# Patient Record
Sex: Female | Born: 1935 | Race: Black or African American | Hispanic: No | Marital: Single | Smoking: Never smoker
Health system: Southern US, Community
[De-identification: ages and names within clinical notes are randomized; demographics above are authoritative.]

## PROBLEM LIST (undated history)

## (undated) DIAGNOSIS — I4891 Unspecified atrial fibrillation: Secondary | ICD-10-CM

## (undated) DIAGNOSIS — F0390 Unspecified dementia without behavioral disturbance: Secondary | ICD-10-CM

## (undated) DIAGNOSIS — K5792 Diverticulitis of intestine, part unspecified, without perforation or abscess without bleeding: Secondary | ICD-10-CM

## (undated) DIAGNOSIS — I10 Essential (primary) hypertension: Secondary | ICD-10-CM

## (undated) DIAGNOSIS — M1990 Unspecified osteoarthritis, unspecified site: Secondary | ICD-10-CM

## (undated) HISTORY — PX: DILATION AND CURETTAGE OF UTERUS: SHX78

## (undated) HISTORY — PX: TOTAL ABDOMINAL HYSTERECTOMY W/ BILATERAL SALPINGOOPHORECTOMY: SHX83

## (undated) HISTORY — PX: JOINT REPLACEMENT: SHX530

## (undated) HISTORY — PX: REPLACEMENT TOTAL KNEE BILATERAL: SUR1225

## (undated) HISTORY — PX: CATARACT EXTRACTION W/ INTRAOCULAR LENS  IMPLANT, BILATERAL: SHX1307

## (undated) HISTORY — PX: CARPAL TUNNEL RELEASE: SHX101

## (undated) HISTORY — PX: TUBAL LIGATION: SHX77

---

## 1999-09-03 ENCOUNTER — Emergency Department (HOSPITAL_COMMUNITY): Admission: EM | Admit: 1999-09-03 | Discharge: 1999-09-03 | Payer: Self-pay

## 2000-02-14 ENCOUNTER — Emergency Department (HOSPITAL_COMMUNITY): Admission: EM | Admit: 2000-02-14 | Discharge: 2000-02-14 | Payer: Self-pay | Admitting: Emergency Medicine

## 2000-02-14 ENCOUNTER — Encounter: Payer: Self-pay | Admitting: Pulmonary Disease

## 2000-02-19 ENCOUNTER — Encounter: Payer: Self-pay | Admitting: Pulmonary Disease

## 2000-02-19 ENCOUNTER — Ambulatory Visit (HOSPITAL_COMMUNITY): Admission: RE | Admit: 2000-02-19 | Discharge: 2000-02-19 | Payer: Self-pay | Admitting: Pulmonary Disease

## 2000-04-19 ENCOUNTER — Encounter: Payer: Self-pay | Admitting: Orthopedic Surgery

## 2000-04-21 ENCOUNTER — Ambulatory Visit (HOSPITAL_COMMUNITY): Admission: RE | Admit: 2000-04-21 | Discharge: 2000-04-21 | Payer: Self-pay | Admitting: Orthopedic Surgery

## 2001-05-09 ENCOUNTER — Encounter: Admission: RE | Admit: 2001-05-09 | Discharge: 2001-06-16 | Payer: Self-pay | Admitting: Orthopedic Surgery

## 2001-08-04 ENCOUNTER — Other Ambulatory Visit: Admission: RE | Admit: 2001-08-04 | Discharge: 2001-08-04 | Payer: Self-pay | Admitting: Otolaryngology

## 2002-09-04 ENCOUNTER — Ambulatory Visit (HOSPITAL_COMMUNITY): Admission: RE | Admit: 2002-09-04 | Discharge: 2002-09-04 | Payer: Self-pay | Admitting: Internal Medicine

## 2004-11-04 ENCOUNTER — Emergency Department (HOSPITAL_COMMUNITY): Admission: EM | Admit: 2004-11-04 | Discharge: 2004-11-04 | Payer: Self-pay | Admitting: Emergency Medicine

## 2004-11-14 ENCOUNTER — Inpatient Hospital Stay (HOSPITAL_COMMUNITY): Admission: AD | Admit: 2004-11-14 | Discharge: 2004-11-17 | Payer: Self-pay | Admitting: Internal Medicine

## 2005-08-18 ENCOUNTER — Inpatient Hospital Stay (HOSPITAL_COMMUNITY): Admission: RE | Admit: 2005-08-18 | Discharge: 2005-08-22 | Payer: Self-pay | Admitting: Orthopedic Surgery

## 2005-11-15 ENCOUNTER — Inpatient Hospital Stay (HOSPITAL_COMMUNITY): Admission: EM | Admit: 2005-11-15 | Discharge: 2005-11-18 | Payer: Self-pay | Admitting: Emergency Medicine

## 2005-11-16 ENCOUNTER — Encounter: Payer: Self-pay | Admitting: Vascular Surgery

## 2006-01-12 ENCOUNTER — Ambulatory Visit (HOSPITAL_COMMUNITY): Admission: RE | Admit: 2006-01-12 | Discharge: 2006-01-12 | Payer: Self-pay | Admitting: Internal Medicine

## 2006-04-19 ENCOUNTER — Encounter: Admission: RE | Admit: 2006-04-19 | Discharge: 2006-04-19 | Payer: Self-pay | Admitting: Orthopedic Surgery

## 2006-04-29 ENCOUNTER — Inpatient Hospital Stay (HOSPITAL_COMMUNITY): Admission: RE | Admit: 2006-04-29 | Discharge: 2006-05-03 | Payer: Self-pay | Admitting: Orthopedic Surgery

## 2006-04-30 ENCOUNTER — Ambulatory Visit: Payer: Self-pay | Admitting: Physical Medicine & Rehabilitation

## 2007-08-12 ENCOUNTER — Encounter: Admission: RE | Admit: 2007-08-12 | Discharge: 2007-08-12 | Payer: Self-pay | Admitting: Orthopedic Surgery

## 2008-10-30 ENCOUNTER — Encounter: Admission: RE | Admit: 2008-10-30 | Discharge: 2008-10-30 | Payer: Self-pay | Admitting: Orthopedic Surgery

## 2009-02-11 ENCOUNTER — Encounter: Admission: RE | Admit: 2009-02-11 | Discharge: 2009-02-11 | Payer: Self-pay | Admitting: Orthopedic Surgery

## 2009-06-10 ENCOUNTER — Encounter: Admission: RE | Admit: 2009-06-10 | Discharge: 2009-06-10 | Payer: Self-pay | Admitting: Orthopedic Surgery

## 2009-08-20 ENCOUNTER — Emergency Department (HOSPITAL_COMMUNITY): Admission: EM | Admit: 2009-08-20 | Discharge: 2009-08-20 | Payer: Self-pay | Admitting: Emergency Medicine

## 2009-09-11 ENCOUNTER — Emergency Department (HOSPITAL_COMMUNITY): Admission: EM | Admit: 2009-09-11 | Discharge: 2009-09-12 | Payer: Self-pay | Admitting: Emergency Medicine

## 2009-09-21 ENCOUNTER — Ambulatory Visit: Payer: Self-pay | Admitting: Pulmonary Disease

## 2009-09-21 ENCOUNTER — Inpatient Hospital Stay (HOSPITAL_COMMUNITY): Admission: EM | Admit: 2009-09-21 | Discharge: 2009-09-27 | Payer: Self-pay | Admitting: Emergency Medicine

## 2009-09-21 ENCOUNTER — Ambulatory Visit: Payer: Self-pay | Admitting: Vascular Surgery

## 2009-09-21 ENCOUNTER — Encounter (INDEPENDENT_AMBULATORY_CARE_PROVIDER_SITE_OTHER): Payer: Self-pay | Admitting: Emergency Medicine

## 2009-09-23 ENCOUNTER — Encounter (INDEPENDENT_AMBULATORY_CARE_PROVIDER_SITE_OTHER): Payer: Self-pay | Admitting: Internal Medicine

## 2010-03-03 ENCOUNTER — Observation Stay (HOSPITAL_COMMUNITY): Admission: EM | Admit: 2010-03-03 | Discharge: 2010-03-05 | Payer: Self-pay | Admitting: Emergency Medicine

## 2010-03-05 ENCOUNTER — Ambulatory Visit: Payer: Self-pay | Admitting: Vascular Surgery

## 2010-03-05 ENCOUNTER — Encounter (INDEPENDENT_AMBULATORY_CARE_PROVIDER_SITE_OTHER): Payer: Self-pay | Admitting: Internal Medicine

## 2010-07-28 ENCOUNTER — Emergency Department (HOSPITAL_COMMUNITY)
Admission: EM | Admit: 2010-07-28 | Discharge: 2010-07-28 | Payer: Self-pay | Source: Home / Self Care | Admitting: Emergency Medicine

## 2010-07-29 LAB — DIFFERENTIAL
Basophils Absolute: 0 10*3/uL (ref 0.0–0.1)
Basophils Relative: 0 % (ref 0–1)
Eosinophils Absolute: 0.2 10*3/uL (ref 0.0–0.7)
Eosinophils Relative: 2 % (ref 0–5)
Lymphocytes Relative: 17 % (ref 12–46)
Lymphs Abs: 2.2 10*3/uL (ref 0.7–4.0)
Monocytes Absolute: 1.4 10*3/uL — ABNORMAL HIGH (ref 0.1–1.0)
Monocytes Relative: 11 % (ref 3–12)
Neutro Abs: 9 10*3/uL — ABNORMAL HIGH (ref 1.7–7.7)
Neutrophils Relative %: 70 % (ref 43–77)

## 2010-07-29 LAB — BASIC METABOLIC PANEL
BUN: 21 mg/dL (ref 6–23)
CO2: 26 mEq/L (ref 19–32)
Calcium: 10 mg/dL (ref 8.4–10.5)
Chloride: 107 mEq/L (ref 96–112)
Creatinine, Ser: 1.38 mg/dL — ABNORMAL HIGH (ref 0.4–1.2)
GFR calc Af Amer: 44 mL/min — ABNORMAL LOW (ref >60)
GFR calc non Af Amer: 36 mL/min — ABNORMAL LOW (ref >60)
Glucose, Bld: 91 mg/dL (ref 70–99)
Potassium: 4 mEq/L (ref 3.5–5.1)
Sodium: 141 mEq/L (ref 135–145)

## 2010-07-29 LAB — POCT CARDIAC MARKERS
CKMB, poc: <1 ng/mL — ABNORMAL LOW (ref 1.0–8.0)
Myoglobin, poc: 169 ng/mL (ref 12–200)
Troponin i, poc: <0.05 ng/mL (ref 0.00–0.09)

## 2010-07-29 LAB — CBC
HCT: 35.5 % — ABNORMAL LOW (ref 36.0–46.0)
Hemoglobin: 11.8 g/dL — ABNORMAL LOW (ref 12.0–15.0)
MCH: 24.1 pg — ABNORMAL LOW (ref 26.0–34.0)
MCHC: 33.2 g/dL (ref 30.0–36.0)
MCV: 72.6 fL — ABNORMAL LOW (ref 78.0–100.0)
Platelets: 280 10*3/uL (ref 150–400)
RBC: 4.89 MIL/uL (ref 3.87–5.11)
RDW: 15.7 % — ABNORMAL HIGH (ref 11.5–15.5)
WBC: 12.8 10*3/uL — ABNORMAL HIGH (ref 4.0–10.5)

## 2010-07-29 LAB — PROTIME-INR
INR: 2.84 — ABNORMAL HIGH (ref 0.00–1.49)
Prothrombin Time: 29.9 seconds — ABNORMAL HIGH (ref 11.6–15.2)

## 2010-07-29 LAB — APTT: aPTT: 34 seconds (ref 24–37)

## 2010-08-03 LAB — CULTURE, BLOOD (ROUTINE X 2)
Culture  Setup Time: 201201231801
Culture  Setup Time: 201201231801
Culture: NO GROWTH
Culture: NO GROWTH

## 2010-08-05 ENCOUNTER — Other Ambulatory Visit: Payer: Self-pay | Admitting: Neurology

## 2010-08-05 ENCOUNTER — Other Ambulatory Visit: Payer: Self-pay | Admitting: Internal Medicine

## 2010-08-05 DIAGNOSIS — R55 Syncope and collapse: Secondary | ICD-10-CM

## 2010-08-11 ENCOUNTER — Other Ambulatory Visit: Payer: Self-pay

## 2010-08-14 ENCOUNTER — Other Ambulatory Visit: Payer: Self-pay

## 2010-08-15 ENCOUNTER — Other Ambulatory Visit: Payer: Self-pay

## 2010-08-18 ENCOUNTER — Ambulatory Visit
Admission: RE | Admit: 2010-08-18 | Discharge: 2010-08-18 | Disposition: A | Discharge status: Home / Self Care | Payer: Medicare Other | Source: Ambulatory Visit | Attending: Internal Medicine | Admitting: Internal Medicine

## 2010-08-18 ENCOUNTER — Ambulatory Visit
Admission: RE | Admit: 2010-08-18 | Discharge: 2010-08-18 | Disposition: A | Discharge status: Home / Self Care | Payer: 59 | Source: Ambulatory Visit | Attending: Internal Medicine | Admitting: Internal Medicine

## 2010-08-18 DIAGNOSIS — R55 Syncope and collapse: Secondary | ICD-10-CM

## 2010-09-18 LAB — BASIC METABOLIC PANEL
BUN: 19 mg/dL (ref 6–23)
BUN: 22 mg/dL (ref 6–23)
BUN: 22 mg/dL (ref 6–23)
CO2: 23 mEq/L (ref 19–32)
CO2: 24 mEq/L (ref 19–32)
CO2: 26 mEq/L (ref 19–32)
Calcium: 8.8 mg/dL (ref 8.4–10.5)
Calcium: 9 mg/dL (ref 8.4–10.5)
Calcium: 9.8 mg/dL (ref 8.4–10.5)
Chloride: 104 mEq/L (ref 96–112)
Chloride: 107 mEq/L (ref 96–112)
Chloride: 108 mEq/L (ref 96–112)
Creatinine, Ser: 1.3 mg/dL — ABNORMAL HIGH (ref 0.4–1.2)
Creatinine, Ser: 1.4 mg/dL — ABNORMAL HIGH (ref 0.4–1.2)
Creatinine, Ser: 1.41 mg/dL — ABNORMAL HIGH (ref 0.4–1.2)
GFR calc Af Amer: 42 mL/min — ABNORMAL LOW (ref >60)
GFR calc Af Amer: 43 mL/min — ABNORMAL LOW (ref >60)
GFR calc Af Amer: 47 mL/min — ABNORMAL LOW (ref >60)
GFR calc non Af Amer: 35 mL/min — ABNORMAL LOW (ref >60)
GFR calc non Af Amer: 35 mL/min — ABNORMAL LOW (ref >60)
GFR calc non Af Amer: 39 mL/min — ABNORMAL LOW (ref >60)
Glucose, Bld: 88 mg/dL (ref 70–99)
Glucose, Bld: 88 mg/dL (ref 70–99)
Glucose, Bld: 99 mg/dL (ref 70–99)
Potassium: 3.4 mEq/L — ABNORMAL LOW (ref 3.5–5.1)
Potassium: 3.6 mEq/L (ref 3.5–5.1)
Potassium: 4 mEq/L (ref 3.5–5.1)
Sodium: 136 mEq/L (ref 135–145)
Sodium: 138 mEq/L (ref 135–145)
Sodium: 138 mEq/L (ref 135–145)

## 2010-09-18 LAB — CBC
HCT: 31.4 % — ABNORMAL LOW (ref 36.0–46.0)
HCT: 31.9 % — ABNORMAL LOW (ref 36.0–46.0)
HCT: 37.4 % (ref 36.0–46.0)
Hemoglobin: 10.4 g/dL — ABNORMAL LOW (ref 12.0–15.0)
Hemoglobin: 10.8 g/dL — ABNORMAL LOW (ref 12.0–15.0)
Hemoglobin: 12.6 g/dL (ref 12.0–15.0)
MCH: 23.9 pg — ABNORMAL LOW (ref 26.0–34.0)
MCH: 24.5 pg — ABNORMAL LOW (ref 26.0–34.0)
MCH: 24.5 pg — ABNORMAL LOW (ref 26.0–34.0)
MCHC: 33.1 g/dL (ref 30.0–36.0)
MCHC: 33.7 g/dL (ref 30.0–36.0)
MCHC: 33.9 g/dL (ref 30.0–36.0)
MCV: 72 fL — ABNORMAL LOW (ref 78.0–100.0)
MCV: 72.3 fL — ABNORMAL LOW (ref 78.0–100.0)
MCV: 72.8 fL — ABNORMAL LOW (ref 78.0–100.0)
Platelets: 242 10*3/uL (ref 150–400)
Platelets: 254 10*3/uL (ref 150–400)
Platelets: 261 10*3/uL (ref 150–400)
RBC: 4.36 MIL/uL (ref 3.87–5.11)
RBC: 4.41 MIL/uL (ref 3.87–5.11)
RBC: 5.14 MIL/uL — ABNORMAL HIGH (ref 3.87–5.11)
RDW: 15.7 % — ABNORMAL HIGH (ref 11.5–15.5)
RDW: 15.9 % — ABNORMAL HIGH (ref 11.5–15.5)
RDW: 16.1 % — ABNORMAL HIGH (ref 11.5–15.5)
WBC: 13 10*3/uL — ABNORMAL HIGH (ref 4.0–10.5)
WBC: 14.9 10*3/uL — ABNORMAL HIGH (ref 4.0–10.5)
WBC: 16.6 10*3/uL — ABNORMAL HIGH (ref 4.0–10.5)

## 2010-09-18 LAB — COMPREHENSIVE METABOLIC PANEL
ALT: 12 U/L (ref 0–35)
AST: 18 U/L (ref 0–37)
Albumin: 3.5 g/dL (ref 3.5–5.2)
Alkaline Phosphatase: 40 U/L (ref 39–117)
BUN: 21 mg/dL (ref 6–23)
CO2: 27 mEq/L (ref 19–32)
Calcium: 9.7 mg/dL (ref 8.4–10.5)
Chloride: 105 mEq/L (ref 96–112)
Creatinine, Ser: 1.41 mg/dL — ABNORMAL HIGH (ref 0.4–1.2)
GFR calc Af Amer: 42 mL/min — ABNORMAL LOW (ref >60)
GFR calc non Af Amer: 35 mL/min — ABNORMAL LOW (ref >60)
Glucose, Bld: 91 mg/dL (ref 70–99)
Potassium: 3.6 mEq/L (ref 3.5–5.1)
Sodium: 138 mEq/L (ref 135–145)
Total Bilirubin: 0.7 mg/dL (ref 0.3–1.2)
Total Protein: 7.8 g/dL (ref 6.0–8.3)

## 2010-09-18 LAB — CULTURE, BLOOD (ROUTINE X 2)
Culture: NO GROWTH
Culture: NO GROWTH

## 2010-09-18 LAB — URINALYSIS, ROUTINE W REFLEX MICROSCOPIC
Bilirubin Urine: NEGATIVE
Glucose, UA: NEGATIVE mg/dL
Hgb urine dipstick: NEGATIVE
Ketones, ur: NEGATIVE mg/dL
Nitrite: NEGATIVE
Protein, ur: NEGATIVE mg/dL
Specific Gravity, Urine: 1.011 (ref 1.005–1.030)
Urobilinogen, UA: 0.2 mg/dL (ref 0.0–1.0)
pH: 6.5 (ref 5.0–8.0)

## 2010-09-18 LAB — POCT CARDIAC MARKERS
CKMB, poc: <1 ng/mL — ABNORMAL LOW (ref 1.0–8.0)
CKMB, poc: <1 ng/mL — ABNORMAL LOW (ref 1.0–8.0)
Myoglobin, poc: 89.5 ng/mL (ref 12–200)
Myoglobin, poc: 93.7 ng/mL (ref 12–200)
Troponin i, poc: <0.05 ng/mL (ref 0.00–0.09)
Troponin i, poc: <0.05 ng/mL (ref 0.00–0.09)

## 2010-09-18 LAB — DIFFERENTIAL
Basophils Absolute: 0 10*3/uL (ref 0.0–0.1)
Basophils Relative: 0 % (ref 0–1)
Eosinophils Absolute: 0.1 10*3/uL (ref 0.0–0.7)
Eosinophils Relative: 1 % (ref 0–5)
Lymphocytes Relative: 14 % (ref 12–46)
Lymphs Abs: 2.1 10*3/uL (ref 0.7–4.0)
Monocytes Absolute: 1.3 10*3/uL — ABNORMAL HIGH (ref 0.1–1.0)
Monocytes Relative: 9 % (ref 3–12)
Neutro Abs: 11.3 10*3/uL — ABNORMAL HIGH (ref 1.7–7.7)
Neutrophils Relative %: 76 % (ref 43–77)

## 2010-09-18 LAB — D-DIMER, QUANTITATIVE: D-Dimer, Quant: 1.1 ug/mL-FEU — ABNORMAL HIGH (ref 0.00–0.48)

## 2010-09-18 LAB — URINE CULTURE
Colony Count: >=100000
Culture  Setup Time: 201108301832

## 2010-09-18 LAB — URINE MICROSCOPIC-ADD ON

## 2010-09-18 LAB — APTT: aPTT: 29 seconds (ref 24–37)

## 2010-09-18 LAB — PROTIME-INR
INR: 1.48 (ref 0.00–1.49)
INR: 1.61 — ABNORMAL HIGH (ref 0.00–1.49)
INR: 2 — ABNORMAL HIGH (ref 0.00–1.49)
Prothrombin Time: 18.1 seconds — ABNORMAL HIGH (ref 11.6–15.2)
Prothrombin Time: 19.3 seconds — ABNORMAL HIGH (ref 11.6–15.2)
Prothrombin Time: 22.8 seconds — ABNORMAL HIGH (ref 11.6–15.2)

## 2010-09-18 LAB — HEPARIN LEVEL (UNFRACTIONATED)
Heparin Unfractionated: 0.17 IU/mL — ABNORMAL LOW (ref 0.30–0.70)
Heparin Unfractionated: 0.25 IU/mL — ABNORMAL LOW (ref 0.30–0.70)
Heparin Unfractionated: 0.32 IU/mL (ref 0.30–0.70)
Heparin Unfractionated: 0.35 IU/mL (ref 0.30–0.70)

## 2010-09-18 LAB — CARDIAC PANEL(CRET KIN+CKTOT+MB+TROPI)
CK, MB: 0.8 ng/mL (ref 0.3–4.0)
Relative Index: INVALID (ref 0.0–2.5)
Total CK: 31 U/L (ref 7–177)
Troponin I: 0.08 ng/mL — ABNORMAL HIGH (ref 0.00–0.06)

## 2010-09-18 LAB — GLUCOSE, CAPILLARY: Glucose-Capillary: 106 mg/dL — ABNORMAL HIGH (ref 70–99)

## 2010-09-18 LAB — TSH: TSH: 1.301 u[IU]/mL (ref 0.350–4.500)

## 2010-09-18 LAB — LIPID PANEL
Cholesterol: 155 mg/dL (ref 0–200)
HDL: 60 mg/dL (ref >39)
LDL Cholesterol: 88 mg/dL (ref 0–99)
Total CHOL/HDL Ratio: 2.6 RATIO
Triglycerides: 36 mg/dL (ref <150)
VLDL: 7 mg/dL (ref 0–40)

## 2010-09-18 LAB — HEMOGLOBIN A1C
Hgb A1c MFr Bld: 6 % — ABNORMAL HIGH (ref <5.7)
Mean Plasma Glucose: 126 mg/dL — ABNORMAL HIGH (ref <117)

## 2010-09-26 LAB — BASIC METABOLIC PANEL
BUN: 27 mg/dL — ABNORMAL HIGH (ref 6–23)
BUN: 22 mg/dL (ref 6–23)
CO2: 25 mEq/L (ref 19–32)
CO2: 24 mEq/L (ref 19–32)
Calcium: 9.8 mg/dL (ref 8.4–10.5)
Calcium: 9.2 mg/dL (ref 8.4–10.5)
Chloride: 102 mEq/L (ref 96–112)
Chloride: 106 mEq/L (ref 96–112)
Creatinine, Ser: 1.53 mg/dL — ABNORMAL HIGH (ref 0.4–1.2)
Creatinine, Ser: 1.42 mg/dL — ABNORMAL HIGH (ref 0.4–1.2)
GFR calc Af Amer: 39 mL/min — ABNORMAL LOW (ref >60)
GFR calc Af Amer: 42 mL/min — ABNORMAL LOW (ref >60)
GFR calc non Af Amer: 32 mL/min — ABNORMAL LOW (ref >60)
GFR calc non Af Amer: 35 mL/min — ABNORMAL LOW (ref >60)
Glucose, Bld: 93 mg/dL (ref 70–99)
Glucose, Bld: 97 mg/dL (ref 70–99)
Potassium: 3.6 mEq/L (ref 3.5–5.1)
Potassium: 3.8 mEq/L (ref 3.5–5.1)
Sodium: 138 mEq/L (ref 135–145)
Sodium: 136 mEq/L (ref 135–145)

## 2010-09-26 LAB — CBC
HCT: 40.3 % (ref 36.0–46.0)
HCT: 37.4 % (ref 36.0–46.0)
Hemoglobin: 13.4 g/dL (ref 12.0–15.0)
Hemoglobin: 12.5 g/dL (ref 12.0–15.0)
MCHC: 33.3 g/dL (ref 30.0–36.0)
MCHC: 33.3 g/dL (ref 30.0–36.0)
MCV: 81 fL (ref 78.0–100.0)
MCV: 80.5 fL (ref 78.0–100.0)
Platelets: 290 10*3/uL (ref 150–400)
Platelets: 241 10*3/uL (ref 150–400)
RBC: 4.98 MIL/uL (ref 3.87–5.11)
RBC: 4.65 MIL/uL (ref 3.87–5.11)
RDW: 14.8 % (ref 11.5–15.5)
RDW: 15.6 % — ABNORMAL HIGH (ref 11.5–15.5)
WBC: 12.5 10*3/uL — ABNORMAL HIGH (ref 4.0–10.5)
WBC: 13.6 10*3/uL — ABNORMAL HIGH (ref 4.0–10.5)

## 2010-09-26 LAB — URINALYSIS, ROUTINE W REFLEX MICROSCOPIC
Bilirubin Urine: NEGATIVE
Bilirubin Urine: NEGATIVE
Glucose, UA: NEGATIVE mg/dL
Glucose, UA: NEGATIVE mg/dL
Hgb urine dipstick: NEGATIVE
Ketones, ur: NEGATIVE mg/dL
Ketones, ur: NEGATIVE mg/dL
Leukocytes, UA: NEGATIVE
Nitrite: NEGATIVE
Nitrite: NEGATIVE
Protein, ur: NEGATIVE mg/dL
Protein, ur: NEGATIVE mg/dL
Specific Gravity, Urine: 1.018 (ref 1.005–1.030)
Specific Gravity, Urine: 1.034 — ABNORMAL HIGH (ref 1.005–1.030)
Urobilinogen, UA: 0.2 mg/dL (ref 0.0–1.0)
Urobilinogen, UA: 1 mg/dL (ref 0.0–1.0)
pH: 6 (ref 5.0–8.0)
pH: 6.5 (ref 5.0–8.0)

## 2010-09-26 LAB — DIFFERENTIAL
Basophils Absolute: 0 10*3/uL (ref 0.0–0.1)
Basophils Absolute: 0.1 10*3/uL (ref 0.0–0.1)
Basophils Relative: 0 % (ref 0–1)
Basophils Relative: 1 % (ref 0–1)
Eosinophils Absolute: 0.2 10*3/uL (ref 0.0–0.7)
Eosinophils Absolute: 0.2 10*3/uL (ref 0.0–0.7)
Eosinophils Relative: 1 % (ref 0–5)
Eosinophils Relative: 2 % (ref 0–5)
Lymphocytes Relative: 15 % (ref 12–46)
Lymphocytes Relative: 17 % (ref 12–46)
Lymphs Abs: 1.9 10*3/uL (ref 0.7–4.0)
Lymphs Abs: 2.2 10*3/uL (ref 0.7–4.0)
Monocytes Absolute: 1.2 10*3/uL — ABNORMAL HIGH (ref 0.1–1.0)
Monocytes Absolute: 1.1 10*3/uL — ABNORMAL HIGH (ref 0.1–1.0)
Monocytes Relative: 10 % (ref 3–12)
Monocytes Relative: 8 % (ref 3–12)
Neutro Abs: 9.2 10*3/uL — ABNORMAL HIGH (ref 1.7–7.7)
Neutro Abs: 9.9 10*3/uL — ABNORMAL HIGH (ref 1.7–7.7)
Neutrophils Relative %: 74 % (ref 43–77)
Neutrophils Relative %: 73 % (ref 43–77)

## 2010-09-26 LAB — URINE CULTURE
Colony Count: 60000
Colony Count: 85000

## 2010-09-26 LAB — POCT CARDIAC MARKERS
CKMB, poc: 2.8 ng/mL (ref 1.0–8.0)
Myoglobin, poc: 146 ng/mL (ref 12–200)
Troponin i, poc: <0.05 ng/mL (ref 0.00–0.09)

## 2010-09-26 LAB — URINE MICROSCOPIC-ADD ON

## 2010-09-29 LAB — CBC
HCT: 27.8 % — ABNORMAL LOW (ref 36.0–46.0)
HCT: 28.1 % — ABNORMAL LOW (ref 36.0–46.0)
HCT: 28.2 % — ABNORMAL LOW (ref 36.0–46.0)
HCT: 28.2 % — ABNORMAL LOW (ref 36.0–46.0)
HCT: 28.9 % — ABNORMAL LOW (ref 36.0–46.0)
HCT: 30 % — ABNORMAL LOW (ref 36.0–46.0)
HCT: 38 % (ref 36.0–46.0)
Hemoglobin: 12.3 g/dL (ref 12.0–15.0)
Hemoglobin: 9 g/dL — ABNORMAL LOW (ref 12.0–15.0)
Hemoglobin: 9.2 g/dL — ABNORMAL LOW (ref 12.0–15.0)
Hemoglobin: 9.4 g/dL — ABNORMAL LOW (ref 12.0–15.0)
Hemoglobin: 9.4 g/dL — ABNORMAL LOW (ref 12.0–15.0)
Hemoglobin: 9.4 g/dL — ABNORMAL LOW (ref 12.0–15.0)
Hemoglobin: 9.8 g/dL — ABNORMAL LOW (ref 12.0–15.0)
MCHC: 32.1 g/dL (ref 30.0–36.0)
MCHC: 32.4 g/dL (ref 30.0–36.0)
MCHC: 32.7 g/dL (ref 30.0–36.0)
MCHC: 32.7 g/dL (ref 30.0–36.0)
MCHC: 33 g/dL (ref 30.0–36.0)
MCHC: 33.2 g/dL (ref 30.0–36.0)
MCHC: 33.3 g/dL (ref 30.0–36.0)
MCV: 78.4 fL (ref 78.0–100.0)
MCV: 79.1 fL (ref 78.0–100.0)
MCV: 79.6 fL (ref 78.0–100.0)
MCV: 79.7 fL (ref 78.0–100.0)
MCV: 79.8 fL (ref 78.0–100.0)
MCV: 80.3 fL (ref 78.0–100.0)
MCV: 80.4 fL (ref 78.0–100.0)
Platelets: 311 10*3/uL (ref 150–400)
Platelets: 330 10*3/uL (ref 150–400)
Platelets: 343 10*3/uL (ref 150–400)
Platelets: 389 10*3/uL (ref 150–400)
Platelets: 393 10*3/uL (ref 150–400)
Platelets: 409 10*3/uL — ABNORMAL HIGH (ref 150–400)
Platelets: 415 10*3/uL — ABNORMAL HIGH (ref 150–400)
RBC: 3.48 MIL/uL — ABNORMAL LOW (ref 3.87–5.11)
RBC: 3.53 MIL/uL — ABNORMAL LOW (ref 3.87–5.11)
RBC: 3.57 MIL/uL — ABNORMAL LOW (ref 3.87–5.11)
RBC: 3.59 MIL/uL — ABNORMAL LOW (ref 3.87–5.11)
RBC: 3.6 MIL/uL — ABNORMAL LOW (ref 3.87–5.11)
RBC: 3.76 MIL/uL — ABNORMAL LOW (ref 3.87–5.11)
RBC: 4.72 MIL/uL (ref 3.87–5.11)
RDW: 15.2 % (ref 11.5–15.5)
RDW: 15.4 % (ref 11.5–15.5)
RDW: 15.5 % (ref 11.5–15.5)
RDW: 15.5 % (ref 11.5–15.5)
RDW: 15.7 % — ABNORMAL HIGH (ref 11.5–15.5)
RDW: 15.9 % — ABNORMAL HIGH (ref 11.5–15.5)
RDW: 16.5 % — ABNORMAL HIGH (ref 11.5–15.5)
WBC: 10.4 10*3/uL (ref 4.0–10.5)
WBC: 10.4 10*3/uL (ref 4.0–10.5)
WBC: 10.6 10*3/uL — ABNORMAL HIGH (ref 4.0–10.5)
WBC: 11.2 10*3/uL — ABNORMAL HIGH (ref 4.0–10.5)
WBC: 11.4 10*3/uL — ABNORMAL HIGH (ref 4.0–10.5)
WBC: 13.1 10*3/uL — ABNORMAL HIGH (ref 4.0–10.5)
WBC: 13.7 10*3/uL — ABNORMAL HIGH (ref 4.0–10.5)

## 2010-09-29 LAB — POCT I-STAT, CHEM 8
BUN: 26 mg/dL — ABNORMAL HIGH (ref 6–23)
Calcium, Ion: 1.22 mmol/L (ref 1.12–1.32)
Chloride: 108 mEq/L (ref 96–112)
Creatinine, Ser: 2.6 mg/dL — ABNORMAL HIGH (ref 0.4–1.2)
Glucose, Bld: 115 mg/dL — ABNORMAL HIGH (ref 70–99)
HCT: 36 % (ref 36.0–46.0)
Hemoglobin: 12.2 g/dL (ref 12.0–15.0)
Potassium: 4.2 mEq/L (ref 3.5–5.1)
Sodium: 137 mEq/L (ref 135–145)
TCO2: 22 mmol/L (ref 0–100)

## 2010-09-29 LAB — DIFFERENTIAL
Basophils Absolute: 0 10*3/uL (ref 0.0–0.1)
Basophils Relative: 0 % (ref 0–1)
Eosinophils Absolute: 0.1 10*3/uL (ref 0.0–0.7)
Eosinophils Relative: 1 % (ref 0–5)
Lymphocytes Relative: 5 % — ABNORMAL LOW (ref 12–46)
Lymphs Abs: 0.7 10*3/uL (ref 0.7–4.0)
Monocytes Absolute: 0.9 10*3/uL (ref 0.1–1.0)
Monocytes Relative: 7 % (ref 3–12)
Neutro Abs: 11.9 10*3/uL — ABNORMAL HIGH (ref 1.7–7.7)
Neutrophils Relative %: 87 % — ABNORMAL HIGH (ref 43–77)

## 2010-09-29 LAB — CARDIAC PANEL(CRET KIN+CKTOT+MB+TROPI)
CK, MB: 0.9 ng/mL (ref 0.3–4.0)
CK, MB: 0.9 ng/mL (ref 0.3–4.0)
CK, MB: 1.6 ng/mL (ref 0.3–4.0)
CK, MB: 1.8 ng/mL (ref 0.3–4.0)
Relative Index: 1.7 (ref 0.0–2.5)
Relative Index: INVALID (ref 0.0–2.5)
Relative Index: INVALID (ref 0.0–2.5)
Relative Index: INVALID (ref 0.0–2.5)
Total CK: 104 U/L (ref 7–177)
Total CK: 37 U/L (ref 7–177)
Total CK: 79 U/L (ref 7–177)
Total CK: 90 U/L (ref 7–177)
Troponin I: 0.06 ng/mL (ref 0.00–0.06)
Troponin I: 0.07 ng/mL — ABNORMAL HIGH (ref 0.00–0.06)
Troponin I: 0.07 ng/mL — ABNORMAL HIGH (ref 0.00–0.06)
Troponin I: 0.08 ng/mL — ABNORMAL HIGH (ref 0.00–0.06)

## 2010-09-29 LAB — COMPREHENSIVE METABOLIC PANEL
ALT: 10 U/L (ref 0–35)
ALT: 11 U/L (ref 0–35)
ALT: 12 U/L (ref 0–35)
AST: 17 U/L (ref 0–37)
AST: 19 U/L (ref 0–37)
AST: 22 U/L (ref 0–37)
Albumin: 1.9 g/dL — ABNORMAL LOW (ref 3.5–5.2)
Albumin: 2 g/dL — ABNORMAL LOW (ref 3.5–5.2)
Albumin: 2.8 g/dL — ABNORMAL LOW (ref 3.5–5.2)
Alkaline Phosphatase: 45 U/L (ref 39–117)
Alkaline Phosphatase: 46 U/L (ref 39–117)
Alkaline Phosphatase: 53 U/L (ref 39–117)
BUN: 10 mg/dL (ref 6–23)
BUN: 26 mg/dL — ABNORMAL HIGH (ref 6–23)
BUN: 7 mg/dL (ref 6–23)
CO2: 21 mEq/L (ref 19–32)
CO2: 23 mEq/L (ref 19–32)
CO2: 23 mEq/L (ref 19–32)
Calcium: 9.4 mg/dL (ref 8.4–10.5)
Calcium: 9.5 mg/dL (ref 8.4–10.5)
Calcium: 9.9 mg/dL (ref 8.4–10.5)
Chloride: 103 mEq/L (ref 96–112)
Chloride: 108 mEq/L (ref 96–112)
Chloride: 110 mEq/L (ref 96–112)
Creatinine, Ser: 1.27 mg/dL — ABNORMAL HIGH (ref 0.4–1.2)
Creatinine, Ser: 1.29 mg/dL — ABNORMAL HIGH (ref 0.4–1.2)
Creatinine, Ser: 2.3 mg/dL — ABNORMAL HIGH (ref 0.4–1.2)
GFR calc Af Amer: 24 mL/min — ABNORMAL LOW (ref >60)
GFR calc Af Amer: 47 mL/min — ABNORMAL LOW (ref >60)
GFR calc Af Amer: 48 mL/min — ABNORMAL LOW (ref >60)
GFR calc non Af Amer: 20 mL/min — ABNORMAL LOW (ref >60)
GFR calc non Af Amer: 39 mL/min — ABNORMAL LOW (ref >60)
GFR calc non Af Amer: 40 mL/min — ABNORMAL LOW (ref >60)
Glucose, Bld: 102 mg/dL — ABNORMAL HIGH (ref 70–99)
Glucose, Bld: 79 mg/dL (ref 70–99)
Glucose, Bld: 85 mg/dL (ref 70–99)
Potassium: 3.4 mEq/L — ABNORMAL LOW (ref 3.5–5.1)
Potassium: 4 mEq/L (ref 3.5–5.1)
Potassium: 4.2 mEq/L (ref 3.5–5.1)
Sodium: 136 mEq/L (ref 135–145)
Sodium: 136 mEq/L (ref 135–145)
Sodium: 137 mEq/L (ref 135–145)
Total Bilirubin: 0.5 mg/dL (ref 0.3–1.2)
Total Bilirubin: 0.6 mg/dL (ref 0.3–1.2)
Total Bilirubin: 1.4 mg/dL — ABNORMAL HIGH (ref 0.3–1.2)
Total Protein: 6.4 g/dL (ref 6.0–8.3)
Total Protein: 6.4 g/dL (ref 6.0–8.3)
Total Protein: 8.3 g/dL (ref 6.0–8.3)

## 2010-09-29 LAB — URINE CULTURE
Colony Count: NO GROWTH
Culture: NO GROWTH
Special Requests: NEGATIVE

## 2010-09-29 LAB — BASIC METABOLIC PANEL
BUN: 13 mg/dL (ref 6–23)
BUN: 16 mg/dL (ref 6–23)
BUN: 24 mg/dL — ABNORMAL HIGH (ref 6–23)
CO2: 21 mEq/L (ref 19–32)
CO2: 21 mEq/L (ref 19–32)
CO2: 22 mEq/L (ref 19–32)
Calcium: 9.1 mg/dL (ref 8.4–10.5)
Calcium: 9.6 mg/dL (ref 8.4–10.5)
Calcium: 9.6 mg/dL (ref 8.4–10.5)
Chloride: 106 mEq/L (ref 96–112)
Chloride: 108 mEq/L (ref 96–112)
Chloride: 111 mEq/L (ref 96–112)
Creatinine, Ser: 1.5 mg/dL — ABNORMAL HIGH (ref 0.4–1.2)
Creatinine, Ser: 1.67 mg/dL — ABNORMAL HIGH (ref 0.4–1.2)
Creatinine, Ser: 2.09 mg/dL — ABNORMAL HIGH (ref 0.4–1.2)
GFR calc Af Amer: 27 mL/min — ABNORMAL LOW (ref >60)
GFR calc Af Amer: 35 mL/min — ABNORMAL LOW (ref >60)
GFR calc Af Amer: 40 mL/min — ABNORMAL LOW (ref >60)
GFR calc non Af Amer: 22 mL/min — ABNORMAL LOW (ref >60)
GFR calc non Af Amer: 29 mL/min — ABNORMAL LOW (ref >60)
GFR calc non Af Amer: 33 mL/min — ABNORMAL LOW (ref >60)
Glucose, Bld: 78 mg/dL (ref 70–99)
Glucose, Bld: 89 mg/dL (ref 70–99)
Glucose, Bld: 89 mg/dL (ref 70–99)
Potassium: 3.6 mEq/L (ref 3.5–5.1)
Potassium: 3.7 mEq/L (ref 3.5–5.1)
Potassium: 3.9 mEq/L (ref 3.5–5.1)
Sodium: 136 mEq/L (ref 135–145)
Sodium: 136 mEq/L (ref 135–145)
Sodium: 140 mEq/L (ref 135–145)

## 2010-09-29 LAB — POCT CARDIAC MARKERS
CKMB, poc: 10.4 ng/mL (ref 1.0–8.0)
Myoglobin, poc: >500 ng/mL (ref 12–200)
Troponin i, poc: <0.05 ng/mL (ref 0.00–0.09)

## 2010-09-29 LAB — PROTIME-INR
INR: 1.35 (ref 0.00–1.49)
INR: 1.35 (ref 0.00–1.49)
INR: 1.42 (ref 0.00–1.49)
INR: 1.43 (ref 0.00–1.49)
INR: 1.64 — ABNORMAL HIGH (ref 0.00–1.49)
INR: 2.02 — ABNORMAL HIGH (ref 0.00–1.49)
Prothrombin Time: 16.6 seconds — ABNORMAL HIGH (ref 11.6–15.2)
Prothrombin Time: 16.6 seconds — ABNORMAL HIGH (ref 11.6–15.2)
Prothrombin Time: 17.2 seconds — ABNORMAL HIGH (ref 11.6–15.2)
Prothrombin Time: 17.3 seconds — ABNORMAL HIGH (ref 11.6–15.2)
Prothrombin Time: 19.3 seconds — ABNORMAL HIGH (ref 11.6–15.2)
Prothrombin Time: 22.7 seconds — ABNORMAL HIGH (ref 11.6–15.2)

## 2010-09-29 LAB — URINALYSIS, ROUTINE W REFLEX MICROSCOPIC
Bilirubin Urine: NEGATIVE
Glucose, UA: NEGATIVE mg/dL
Ketones, ur: NEGATIVE mg/dL
Leukocytes, UA: NEGATIVE
Nitrite: NEGATIVE
Protein, ur: NEGATIVE mg/dL
Specific Gravity, Urine: 1.014 (ref 1.005–1.030)
Urobilinogen, UA: 1 mg/dL (ref 0.0–1.0)
pH: 5.5 (ref 5.0–8.0)

## 2010-09-29 LAB — GLUCOSE, CAPILLARY: Glucose-Capillary: 110 mg/dL — ABNORMAL HIGH (ref 70–99)

## 2010-09-29 LAB — TYPE AND SCREEN
ABO/RH(D): B POS
Antibody Screen: NEGATIVE

## 2010-09-29 LAB — CK TOTAL AND CKMB (NOT AT ARMC)
CK, MB: 3.1 ng/mL (ref 0.3–4.0)
Relative Index: 2.5 (ref 0.0–2.5)
Total CK: 124 U/L (ref 7–177)

## 2010-09-29 LAB — HEPARIN LEVEL (UNFRACTIONATED)
Heparin Unfractionated: 0.2 IU/mL — ABNORMAL LOW (ref 0.30–0.70)
Heparin Unfractionated: 0.29 IU/mL — ABNORMAL LOW (ref 0.30–0.70)
Heparin Unfractionated: 0.31 IU/mL (ref 0.30–0.70)
Heparin Unfractionated: 0.33 IU/mL (ref 0.30–0.70)
Heparin Unfractionated: 0.34 IU/mL (ref 0.30–0.70)
Heparin Unfractionated: 0.36 IU/mL (ref 0.30–0.70)
Heparin Unfractionated: 0.45 IU/mL (ref 0.30–0.70)
Heparin Unfractionated: 0.53 IU/mL (ref 0.30–0.70)
Heparin Unfractionated: 0.72 IU/mL — ABNORMAL HIGH (ref 0.30–0.70)
Heparin Unfractionated: <0.1 IU/mL — ABNORMAL LOW (ref 0.30–0.70)

## 2010-09-29 LAB — CULTURE, BLOOD (ROUTINE X 2)
Culture: NO GROWTH
Culture: NO GROWTH

## 2010-09-29 LAB — TSH: TSH: 1.649 u[IU]/mL (ref 0.350–4.500)

## 2010-09-29 LAB — URIC ACID: Uric Acid, Serum: 7.9 mg/dL — ABNORMAL HIGH (ref 2.4–7.0)

## 2010-09-29 LAB — D-DIMER, QUANTITATIVE (NOT AT ARMC): D-Dimer, Quant: >20 ug/mL-FEU — ABNORMAL HIGH (ref 0.00–0.48)

## 2010-09-29 LAB — ABO/RH: ABO/RH(D): B POS

## 2010-09-29 LAB — MRSA PCR SCREENING: MRSA by PCR: NEGATIVE

## 2010-09-29 LAB — BRAIN NATRIURETIC PEPTIDE: Pro B Natriuretic peptide (BNP): 178 pg/mL — ABNORMAL HIGH (ref 0.0–100.0)

## 2010-09-29 LAB — APTT: aPTT: 29 seconds (ref 24–37)

## 2010-09-29 LAB — TROPONIN I: Troponin I: 0.1 ng/mL — ABNORMAL HIGH (ref 0.00–0.06)

## 2010-09-29 LAB — URINE MICROSCOPIC-ADD ON

## 2010-11-21 NOTE — H&P (Signed)
NAME:  Shelly [name], CRUMRINE NO.:  [id number]   MEDICAL RECORD NO.:  [id number]          PATIENT TYPE:  EMS   LOCATION:  MAJO                         FACILITY:  [facility]   PHYSICIAN:  Hillery Aldo, M.D.   DATE OF BIRTH:  02-06-1919   DATE OF ADMISSION:  11/15/2005  DATE OF DISCHARGE:                                HISTORY & PHYSICAL   PRIMARY CARE PHYSICIAN:  Della Goo, MD.   CHIEF COMPLAINT:  Nausea and vomiting x2, syncopal event, fever.   HISTORY OF PRESENT ILLNESS:  The patient is an 77 year old female, who woke  this morning with fever and chills.  The patient's daughter stated that she  fainted in front of her and was unresponsive.  She also had two episodes  of nausea and vomiting, but none since she has been here in the emergency  department.  She denies dyspnea but has had a productive cough with yellow  sputum production.  She denies chest pain or dysuria.  She has had some  diarrhea for the last day or two but denies any melena or hematochezia.  No  headaches.  No complaints of focal neurologic deficits, though she feels  weak in general.   PAST MEDICAL HISTORY:  1.  Diabetes.  2.  Gastroesophageal reflux disease.  3.  Hypertension.  4.  Osteoarthritis status post right total knee replacement in April of      2007.  5.  COPD.  6.  Iron deficiency anemia.  7.  History of carpal tunnel syndrome to the right wrist status post      release, October 2001.  8.  History of thyroid nodule.  9.  Bilateral cataract surgery.  10. History of tubal ligation.   FAMILY HISTORY:  The patient's mother died in her 26s from a stroke.  She is  unaware of the cause of her father's death.  She has six siblings, one of  her brothers died with prostate cancer, another died of emphysema, and  another died of alcohol abuse.  She has one sister, who died of an acute MI  in her 53s.   SOCIAL HISTORY:  The patient is married and lives with her husband.  She is  a  lifelong nonsmoker.  She denies any alcohol or drug use.  She has four  children, who are healthy.   DRUG ALLERGIES:  MORPHINE.   CURRENT MEDICATIONS:  1.  Avandia 2 mg daily.  2.  Nexium 40 mg daily.  3.  Etodolac 400 mg b.i.d.  4.  Triamterene/HCTZ 37.5/25 daily.  5.  Tramadol 50 mg q.6h p.r.n.  6.  Iron 45 mg daily.   PHYSICAL EXAMINATION:  VITAL SIGNS:  Temperature 102.3, pulse 93,  respirations 18, blood pressure 127/54, O2 saturation 96% on room air.  GENERAL:  This is a well-developed, well-nourished female, who appears to be  her stated age.  She is in no acute distress.  HEENT:  Normocephalic, atraumatic.  PERRL.  EOMI.  Oropharynx is clear with  dentures intact.  Tongue is midline.  Palate rises symmetrically.  NECK:  Supple,  no thyromegaly, no lymphadenopathy, no jugulovenous  distention.  CHEST:  There are decreased breath sounds throughout with an occasional  adventitious sound.  HEART:  There is an occasional skipped beat when auscultating, but otherwise  regular rhythm.  ABDOMEN:  Soft, nontender, nondistended with normoactive bowel sounds.  EXTREMITIES:  No clubbing, edema, or cyanosis.  SKIN:  Warm and dry.  No rashes.  NEUROLOGIC:  The patient is alert and oriented x3.  Cranial nerves II-XII  are grossly intact.  She moves all extremities x4 with good strength.   DATA REVIEW:  Chest x-ray shows cardiac enlargement and pulmonary venous  hypertension.  No overt edema.  Changes of COPD are noted.  Pulmonary  arterial hypertension is most likely based on the current film.   LABORATORY DATA:  White blood cell count is 11.7, hemoglobin 9.1, hematocrit  26.5, platelet count 265 with an MCV of 73.4 and absolute neutrophil count  of 11.  Sodium is 140, potassium 3.3, chloride 110, bicarb 20, BUN 19,  creatinine 2.0, glucose 94.  Cardiac markers were negative x2 except for an  elevated myoglobin.  Urinalysis is negative for signs of infection.   ASSESSMENT AND PLAN:   1.  Fever:  The patient endorses a recent bout of bronchitis and this is the      likely etiology of her fever.  She has no evidence of urinary tract      infection.  She does report a productive cough and we will send the      sputum for gram stain and culture.  We will also obtain blood cultures      x2.  She will be empirically started on Rocephin and Azithromycin.  2.  Syncope:  This is unclear but likely is related to her underlying      infection.  We will check orthostatic vital signs as well as a two-      dimensional echocardiogram to rule out cardiac sources.  Additionally,      we will monitor her on telemetry.  I will check a D-dimer and if it is      elevated consider a CT scan of the chest to rule out pulmonary embolism.      Additionally, I will check a TSH and a free T4.  3.  Diabetes:  The patient has longstanding diabetes.  Apparently it is      controlled with Avandia.  We will continue this medication and check a      hemoglobin A1c to ensure that she has good glycemic control.  We will      administer sliding scale insulin as needed.  4.  Hypokalemia:  We will replete the patient's potassium.  She is a on a      diuretic and this likely explains her low potassium.  5.  Renal insufficiency:  Unclear if this is acute or chronic.  Since she      does have a history of hypertension and diabetes, she will likely have      some element of chronic renal insufficiency.  With a creatinine at this      level, hydrochlorothiazide is ineffective as a diuretic so I will switch      her to Lasix 20 mg daily and monitor her fluids IM status.  Again, we      will check a two-dimensional echocardiogram as some of her complaints      may be secondary to some underlying component of congestive heart  failure.  We will also check a BNP.  6.  Anemia:  The patient has a known history of iron deficiency anemia.  We      will switch her to prescription-strength Nu-Iron daily and obtain a      ferritin and other iron studies.  7.  Prophylaxis:  We will initiate gastrointestinal and deep venous      thrombosis prophylaxis.           ______________________________  Hillery Aldo, M.D.     CR/MEDQ  D:  11/15/2005  T:  11/15/2005  Job:  [id number]   cc:   Della Goo, M.D.  Fax: [phone]

## 2010-11-21 NOTE — Discharge Summary (Signed)
NAMEAUDINE, MANGIONE NO.:  [id number]   MEDICAL RECORD NO.:  [id number]          PATIENT TYPE:  INP   LOCATION:  5041                         FACILITY:  [facility]   PHYSICIAN:  Myrtie Neither, MD      DATE OF BIRTH:  Sep 24, 1918   DATE OF ADMISSION:  08/18/2005  DATE OF DISCHARGE:  08/22/2005                                 DISCHARGE SUMMARY   ADMITTING DIAGNOSIS:  Degenerative arthropathy, right knee.   DISCHARGE DIAGNOSIS:  Degenerative arthropathy, right knee.   COMPLICATIONS:  None.   INFECTIONS:  None.   OPERATION:  Right total knee arthroplasty, Valmed implant.   PROCEDURE:  Right total knee arthroplasty.   PERTINENT HISTORY:  This is an 77 year old female followed in the office for  degenerative arthropathy in the bilateral knees, the right being worse than  the left.  The patient had been treated with anti-inflammatories and pain  medication, and use of quad cane and walker.  The patient's symptoms  progressively worsened.  Right knee __________  medially and laterally.  Range of motion was limited, crepitus, effusion and negative Homans' test.  X-ray revealed sclerosis, osteophytes and loss of joint space.   HOSPITAL COURSE:  The patient had a preop medical evaluation and was found  to be stable for surgery by her medical physician.   PREOPERATIVE LABORATORY:  CBC, EKG, chest x-ray, CMET, PT, PTT, platelet  count and UA.  These were found to be stable for the patient to undergo  surgery.   The patient underwent a right total knee arthroplasty and tolerated the  procedure quite well.  Postop course is fairly benign.  The patient was  started on preop and postop IV antibiotics, CPM, Coumadin therapy, PT and  OT.  The patient's progressed with partial weightbearing on the right side  with the use of a walker.  The pain was brought under control.  H&H was  stable.  The patient is stable enough to be discharged home with home health  and PT.   MEDICATIONS:  1.  Skelaxin 800 mg t.i.d.  2.  Percocet 5 mg q.6 p.r.n.  3.  Feosol 300 mg b.i.d.  4.  Coumadin 5 mg x2 weeks.   Shower bars, INR weekly and return to the office in 1 week.  The patient was  discharged in stable and satisfactory condition.      Myrtie Neither, MD  Electronically Signed     AC/MEDQ  D:  10/13/2005  T:  10/13/2005  Job:  [id number]

## 2010-11-21 NOTE — Op Note (Signed)
NAMEGWENDOLYNN, Shelly [name] NO.:  [id number]   MEDICAL RECORD NO.:  [id number]          PATIENT TYPE:  INP   LOCATION:  5041                         FACILITY:  [facility]   PHYSICIAN:  Myrtie Neither, MD      DATE OF BIRTH:  1919-02-08   DATE OF PROCEDURE:  10/28/2005  DATE OF DISCHARGE:  08/22/2005                                 OPERATIVE REPORT   PREOPERATIVE DIAGNOSIS:  Degenerative arthritis, right knee.   POSTOPERATIVE DIAGNOSIS:  Degenerative arthritis, right knee.   OPERATION PERFORMED:  Right total knee arthroplasty, BioMet component.   SURGEON:  Myrtie Neither, MD   ANESTHESIA:  General.   DESCRIPTION OF PROCEDURE:  The patient was taken to the operating room,  given adequate preop medications, given general anesthesia and intubated.  Right knee was prepped with DuraPrep and draped in sterile manner.  Tourniquet and Bovie used for hemostasis.  Anterior midline incision made  over the right knee, going through skin and subcutaneous tissue.  Sharp and  blunt dissection was made both medially and laterally.  A paramedian  incision was made into the capsule, extending from the quadriceps down to  the tibial tuberosity.  Patella was reflected laterally.  Knee was taken up  in a flexed position.  Osteophytes about the patella, femur and tibia were  resected.  Soft tissue resection was done.  Next, tibial cutting jig was put  in place, 10 mm.  Anterior tibial plateau surface was removed.  IM reaming  was done down the canal, femoral canal, followed by placement of the distal  femoral cutting jig.  This was set at 60 degrees of valgus.  Next, sizing on  the femur was done which was found to be 62.5 mm.  This jig was put into  place and anterior, posterior chamfer cuts were made.  Femoral trial was put  in place and was found to be a very good snug.  Sizing of the tibial plateau  surface was done and was found to be 63 to 67 mm.  Appropriate cutting jigs  of the tibial  surface was done and appropriate cuts were made.  Trial  components, femur and tibial components were put in place.  Full extension,  full flexion, good medial and lateral stability, trialed with the 12 mm  poly.  Attention was then turned to the patella which was found to be a  medium 31 mm patella.  Appropriate cutting jig was put in place after which  patellar button trial was put in place.  Range of motion again was tested,  full extension, full flexion.  No lateral subluxation of the patella.  Good  medial and lateral stability.  Tourniquet was let down.  Hemostasis  obtained.  Methyl methacrylate was mixed.  Tibial and patellar components  were cemented.  Femoral component was press-fit.  Again testing with a 12 mm  was found to be good and stable for the poly.  The poly was then locked in  place.  Copious irrigation done.  Hemostasis obtained.  Wound closure was  then done with 0  Vicryl for the fascia, 2-0 for the subcutaneous and skin  staples for the skin.  Bulky compressive dressing was applied.  The patient  tolerated the procedure quite well, went to recovery room in stable and  satisfactory condition.     Myrtie Neither, MD  Electronically Signed    AC/MEDQ  D:  10/28/2005  T:  10/29/2005  Job:  [id number]

## 2010-11-21 NOTE — Discharge Summary (Signed)
[name], Shelly [name]                 ACCOUNT NO.:  [id number]   MEDICAL RECORD NO.:  [id number]          PATIENT TYPE:  INP   LOCATION:  3704                         FACILITY:  [facility]   PHYSICIAN:  Mobolaji B. Bakare, M.D.DATE OF BIRTH:  1918-08-25   DATE OF ADMISSION:  11/15/2005  DATE OF DISCHARGE:  11/18/2005                                 DISCHARGE SUMMARY   PRIMARY CARE PHYSICIAN:  Della Goo, M.D.   FINAL DIAGNOSES:  1.  Left lower extremity deep vein thrombosis.  2.  Syncope, resolved:  Most likely situational syncope.  3.  Supraventricular tachycardia, resolved.  4.  Chronic obstructive pulmonary disease, stable.  5.  Diabetes mellitus type 2, controlled.  6.  Iron-deficiency anemia, stool hemoccult negative x2.  7.  Acute renal failure, improved.  8.  Hypertension.  9.  Rheumatoid arthritis.   PROCEDURE:  Dictation ended at this point.      Mobolaji B. Corky Downs, M.D.  Electronically Signed     MBB/MEDQ  D:  11/18/2005  T:  11/19/2005  Job:  [id number]

## 2010-11-21 NOTE — Discharge Summary (Signed)
Shelly [name], Shelly [name] NO.:  [id number]   MEDICAL RECORD NO.:  [id number]          PATIENT TYPE:  INP   LOCATION:  5741                         FACILITY:  [facility]   PHYSICIAN:  Della Goo, M.D. DATE OF BIRTH:  1918/09/10   DATE OF ADMISSION:  11/14/2004  DATE OF DISCHARGE:  11/17/2004                                 DISCHARGE SUMMARY   FINAL DIAGNOSES:  1.  Chronic obstructive pulmonary disease exacerbation/chronic bronchitis      with acute bronchitis.  2.  Mild dehydration.  3.  Oral Candidiasis.  4.  Hypertension.  5.  Iron deficiency anemia.  6.  Borderline type 2 diabetes mellitus.  7.  Osteoarthritis.   HOSPITAL COURSE:  This is an 77 year old female with a history of chronic  obstructive pulmonary disease in her usual state of good health until two  weeks prior when she began to have fever, chills, night sweats, and  increased productive cough of yellowish sputum.  The patient had been seen  in the emergency department and placed on outpatient antibiotic therapy of  Avalox for seven-day course.  However, symptoms did not improve.  The  patient reported having worsening symptoms of shortness of breath and  fevers, chills, myalgias, weakness, along progressive loss of appetite  prompting direct admission after being seen in the office.  A chest x-ray  was performed on admission which did reveal mild chronic obstructive  pulmonary disease changes and bronchial thickening.  No signs of acute  disease/infiltrates.  The patient was found to have low grade temperature of  99 on admission, and mildly elevated blood pressures of 155/79.  Blood  cultures were sent.  Labs were also performed.  CBC with manual differential  and CMP, TSH levels, as well.  Results which returned with a leukocytosis  with left shift at a level of 12.1.  The patient had also reported having  frequent urination and a urinalysis was performed with negative results.  CBGs were also  ordered after finding an elevated blood glucose level on the  complete metabolic profile.  The patient had previously been on steroid  therapy which was discontinued.  While hospitalized her blood glucose levels  were recorded as being 177, then 124, then 105, then 123.  Her IV fluids  were changed.  The dextrose was discontinued, and the patient's blood  glucose levels normalized as well.  The patient was placed on antibiotic  therapy of Rocephin 1 g IV daily along with azithromycin therapy orally.  IV  fluid for gentle rehydration was given.  The patient also on physical  examination was found to have oral thrush.  Fluconazole therapy was added  orally for this.  On May 14, the patient's blood cultures returned.  One  culture result was positive for gram-positive cocci in clusters.  The second  culture was negative.  This was thought to be secondary to a contamination.  The patient continued on Rocephin therapy and her azithromycin therapy.  The  patient had improvement in her overall symptoms.  Her general malaise was,  however, thought to be  secondary to anemia as well as her acute illness.  Her hemoglobin level on admission was 11.7 with hydration.  This decreased  to 10.4, then 9.9.  Her MCV was found to be 75.6.  Iron studies were also  ordered showing a decreased total iron level of 11, a decreased total iron-  binding capacity of 175, and a decreased percent saturation of 6%.  The  patient's iron deficiency was felt to be secondary to poor nutrition.  The  patient was placed on iron supplementation along with B12 and folate  supplementation, and started on a multivitamin therapy.  The patient was  encouraged to resume eating.  The patient, however, described a loss of  taste and appetite.  This was possibly due to the oral Candidiasis.  The  patient was to follow up in the office in one week post hospitalization.  On  discharge, her vital signs were stable.  She had overall  improvement of her  condition.   DISCHARGE MEDICATIONS:  1.  Azithromycin 250 mg one tablet p.o. daily for four additional days with      food.  2.  Trinsicon tablets, which are a B12 iron folate supplement, one tablet      twice daily with food.  3.  Triamterine/Hydrochlorothiazide 37.5/25 one tablet p.o. daily with      bananas or orange juice.  4.  Feldene one tablet p.o. daily with food.  5.  Tramadol 50 mg one tablet every six hours as needed for pain.  6.  Prilosec 20 mg one p.o. daily.  7.  Multivitamin one tablet p.o. daily.  8.  Entex PSE 120/600 one tablet p.o. b.i.d. p.r.n. congestion.   The patient was to resume her regular diet.  However, was advised to  supplement with Ensure liquids p.r.n.  The patient was to follow up in one  week for post hospitalization evaluation.       HJ/MEDQ  D:  01/21/2005  T:  01/22/2005  Job:  [id number]

## 2010-11-21 NOTE — Discharge Summary (Signed)
NAMELARON, ANGELINI                 ACCOUNT NO.:  [id number]   MEDICAL RECORD NO.:  [id number]          PATIENT TYPE:  INP   LOCATION:  3704                         FACILITY:  [facility]   PHYSICIAN:  Mobolaji B. Bakare, M.D.DATE OF BIRTH:  [birth year]   DATE OF ADMISSION:  11/15/2005  DATE OF DISCHARGE:  11/18/2005                                 DISCHARGE SUMMARY   PRIMARY CARE PHYSICIAN:  Della Goo, M.D.   FINAL DIAGNOSES:  1.  Left lower extremity deep vein thrombosis.  2.  Syncope, resolved.  3.  Supraventricular tachycardia, resolved.  4.  Chronic obstructive pulmonary disease, stable.  5.  Iron deficiency anemia with stool Hemoccults negative times two.  6.  Acute renal failure, improved.  7.  Hypertension, controlled.  8.  Rheumatoid arthritis.  9.  Diabetes mellitus.   PROCEDURES:  1.  Chest x-ray, done on May 13, showed cardiomegaly and probable pulmonary      venous hypertension, COPD.  2.  V/Q scan, done on Nov 16, 2005, showed low probability for pulmonary      embolus.  3.  Lower extremity Doppler, done on May 14, showed left calf vein DVT.  4.  2-D echocardiogram, done on Nov 16, 2005.  Results of this pending.   BRIEF HISTORY:  Ms. Heikkila is a pleasant 77 year old African-American  female, who was admitted on Nov 15, 2005, after having had an episode of  syncope.  Daughter stated that the patient was in the bathroom and she  realized that she became unresponsive.  This was after two episodes of  nausea and vomiting.  EMS was called and she was brought to the emergency  room.  On arrival, the patient was awake, alert and oriented.  Initial  vitals showed a temperature of 102.3, pulse of 93, respiratory rate of 18  and blood pressure of 127/54.   REVIEW OF SYSTEMS:  Review of systems revealed that the patient has been  coughing and cough is productive of yellowish sputum.  She had pleuritic  chest pain on coughing.   PHYSICAL EXAMINATION:  Revealed reduced  breath sounds throughout with  occasional adventitious sounds.   LABORATORY DATA:  Showed mildly elevated white cells of 11.7, hemoglobin of  9.1.  Cardiac markers were negative.   EKG:  The EKG showed no acute ST changes.  There was sinus tachycardia of  124 beats per minute.   HOSPITAL COURSE:  The patient was admitted for further evaluation.  Of note  on the laboratory data was D-dimer greater than 20.   1.  Left lower extremity DVT:  Given the elevated D-dimer, this was      evaluated with the lower extremity Dopplers, which revealed DVT in the      left calf vein.  A V/Q scan showed low probability for PE.  The patient      could not have CT angiogram done because of acute renal insufficiency.      She was started on Lovenox and Coumadin.  Her INR at the time of      discharge is 1.4.  She would overlap Lovenox with Coumadin.  The patient      will have an R.N. follow up at home to give Lovenox daily and check      PT/INR on Friday, Nov 20, 2005, follow up results and further      recommendations with Dr. Della Goo.  2.  Syncope:  As described by the patient's daughter, this syncopal episode      occurred while she was in the bathroom and after having vomited.  This      was felt to be vasovagal.  She ruled out for myocardial infarction and      EKG was unremarkable.  Syncopal episode did not recur during the course      of hospitalization.  3.  Acute bronchitis:  The patient had a temperature and mild leukocytosis.      She had cough and chest x-ray did not show any pneumonia.  She was      started empirically on antibiotics with Zithromax and Rocephin.  White      cell count normalized and fever improved.  Cough has since decreased and      sputum culture was unavailable.  She did not have any episode of COPD      exacerbation during the course of hospitalization.  4.  Sinus tachycardia versus SVT:  It was noted, while the patient was on      telemetry, to have short  bursts of sinus tachycardia, sinus arrhythmia      and question of SVT.  This corresponded to the period of emotional      disturbance.  This occurred on two occasions.  These outbursts were more      or less short-lived.  She has a normal TSH.  The patient was upset      because she wanted to go home and she was crying.  Then it was noted, on      telemetry, to have these abnormal readings.  The second episode was when      the pastor was praying with her.  Nevertheless, she was asymptomatic      during these periods.  Short doses of Lopressor were temporarily given.      She was also given Xanax for anxiety.  Currently, her heart rate is in      the 70's and normal sinus rhythm.  5.  Iron deficiency anemia:  The patient has a history of iron deficiency      anemia.  Again, anemia panel was done, which was compatible with iron      deficiency and anemia of chronic disease.  Of note is that iron is less      than 10 with ferritin of 141 and TIBC was not calculated.  She was      continued on iron supplement.  Stool Hemoccult was negative times two.      She will follow up with Dr. Della Goo in the outpatient setting      for further evaluation, if she so wishes.  6.  COPD:  This was stable during the course of hospitalization.  The      patient was continued on nebulization.  7.  Diabetes mellitus:  This was controlled.  She was continued on Avandia      and sliding scale insulin supplements.  8.  Acute renal failure:  This was prerenal and it was felt that triamterene      hydrochlorothiazide could have contributed to this.  The patient was      hydrated gently and creatinine improved to 1.5 at the time of discharge.  9.  Rheumatoid arthritis involving both hands:  The patient will follow up      with Dr. Della Goo.  She is on analgesic.  I have held Ketorolac     until she follows up with Dr. Lovell Sheehan and I will repeat BMP.  She would      continue Tramadol as before.    DISCHARGE MEDICATIONS:  1.  Avandia 2 mg p.o. daily.  2.  Nexium 40 mg daily.  3.  Triamterene hydrochlorothiazide one daily.  4.  Tramadol 50 mg q.6h. p.r.n.  5.  Iron 45 mg daily.  6.  Avelox 400 mg daily for 4 more days.  7.  Mucinex 600 mg two times a day.  8.  Coumadin 5 mg daily until Thursday,  Nov 19, 2005, and follow up      recommendations from Dr. Lovell Sheehan on Friday, Nov 20, 2005.  9.  Lovenox 65 mg subcu daily.   RECOMMENDATIONS:  The patient should have PT/INR drawn on Friday, Nov 20, 2005, then call Dr. Lovell Sheehan' office for appropriate Coumadin dose.   CONDITION ON DISCHARGE:  Stable, improved.  The patient did well with  physical therapy and recommendation is to have home health PT/OT.   DISCHARGE LABORATORY DATA:  Sodium 140, potassium 3.7, chloride 108, bicarb  24, glucose 83, BUN 13, creatinine 1.5, calcium 9.2, PT 16.9, INR 1.4.  Stool Hemoccult times two negative.  Free T4 1.38.  TSH 1.102.  Hemoglobin  A1c 5.6.  BNP 89.  Liver function tests normal.   Pending investigation:  2D echocardiogram.  Result not available at the time  of discharge.      Mobolaji B. Corky Downs, M.D.  Electronically Signed     MBB/MEDQ  D:  11/18/2005  T:  11/18/2005  Job:  [id number]   cc:   Della Goo, M.D.  Fax: [phone]

## 2010-11-21 NOTE — Discharge Summary (Signed)
NAMEFALLYN, MUNNERLYN NO.:  [id number]   MEDICAL RECORD NO.:  [id number]            PATIENT TYPE:   LOCATION:                                 FACILITY:   PHYSICIAN:  Myrtie Neither, MD           DATE OF BIRTH:   DATE OF ADMISSION:  04/29/2006  DATE OF DISCHARGE:  05/03/2006                               DISCHARGE SUMMARY   ADMITTING DIAGNOSIS:  Degenerative arthropathy of left knee.   DISCHARGE DIAGNOSIS:  Degenerative arthropathy of left knee.   COMPLICATIONS:  None.   CONSULTATION:  None.   OPERATION:  Left total knee arthroplasty.   PERTINENT HISTORY:  This is a 77 year old female followed in the office  for degenerative joint disease involving the left knee.  The patient had  done well over the past few years, but more recently became more  immobile, dysfunction due to pain and instability of the left knee.   PERTINENT PHYSICAL:  The left knee, genu valgum with crepitus medially  and laterally with instability.  Range of motion is limited, +2  effusion, and negative Homans test.   DIAGNOSTIC STUDIES:  X-rays revealed loss of joint space with medial  apartment with osteophytes and sclerosis.   HOSPITAL COURSE:  The patient had preop medical evaluation and is found  to be clear for surgery.  The patient had also preop labs, CBC, EKG,  chest x-ray, PT, PTT, platelet count, UA, and CMET.  The patient's  laboratory was stable enough to undergo surgery and underwent left total  knee arthroplasty on April 29, 2006.  The patient tolerated procedure  quite well.  Postop course was fairly benign.  The patient was placed on  preop and postop IV antibiotics, prophylactic Coumadin therapy, use of  CPM, and partial weightbearing on the left side.  The patient underwent  OT, PT, and case management evaluation and progressed well enough to be  discharged home with pain under control and using the walker well enough  to ambulate.  The patient was discharged,  continued on Coumadin and  INRs, Percocet 1 to 2 q.4 p.r.n.  Home health nurse and PT arranged.  The patient was discharged in stable and satisfactory condition to  return to the office in 1 week.      Myrtie Neither, MD  Electronically Signed     AC/MEDQ  D:  10/19/2007  T:  10/20/2007  Job:  [id number]

## 2010-11-21 NOTE — H&P (Signed)
[place]  Patient:    Shelly [name], Shelly [name]                        MRN: [id number] Adm. Date:  [id number] Disc. Date: [id number] Attending:  Wende Mott                         History and Physical  CHIEF COMPLAINT:  Pain and numbness in the right hand and difficulty holding and grasping objects.  HISTORY OF PRESENT ILLNESS:  This is a 77 year old female who has been followed for carpal tunnel syndrome for the last year or two. The patients condition has progressively gotten worse in the right hand with thenar and intrinsic muscle atrophy and weakness on gripping and grasping objects. The patient has been treated with wrist splinting, anti-inflammatories, which, presently, has not helped.  PAST MEDICAL HISTORY: 1. Degenerative joint disease. 2. History of high blood pressure.  ALLERGIES:  None known.  MEDICATIONS: 1. Vioxx 25 mg daily. 2. HCTZ. 3. Hydrocodone.  REVIEW OF SYSTEMS:  Basically normal cardiac and respiratory, no urinary or bowel symptoms.  FAMILY HISTORY:  Noncontributory.  HABITS:  None.  PHYSICAL EXAMINATION:  VITAL SIGNS:  Temperature 98.7, pulse 80, respirations 18, blood pressure 120/70.  HEENT:  Normocephalic. Eyes, conjunctivae clear.  NECK:  Supple.  CHEST:  Clear.  CARDIAC:  S1 and S2 regular.  EXTREMITIES:  Right wrist; positive Tinels, positive Phalens test, thenar atrophy and intrinsic muscle weakness, weak pinching grip.  IMPRESSION:  Carpal tunnel syndrome right wrist. DD:  04/21/00 TD:  04/21/00 Job: [id number]

## 2010-11-21 NOTE — Op Note (Signed)
[name], [name] NO.:  [id number]   MEDICAL RECORD NO.:  [id number]          PATIENT TYPE:  INP   LOCATION:  5034                         FACILITY:  [facility]   PHYSICIAN:  Shelly Neither, MD      DATE OF BIRTH:  [birth year]   DATE OF PROCEDURE:  04/29/2006  DATE OF DISCHARGE:  05/03/2006                               OPERATIVE REPORT   PREOPERATIVE DIAGNOSIS:  Degenerative arthropathy, left knee.   POSTOPERATIVE DIAGNOSIS:  Degenerative arthropathy, left knee.   COMPLICATIONS:  None.   INFECTIONS:  None.   OPERATIONS:  Left total knee arthroplasty, Biomet implant.   The patient taken to operating room after given adequate preop  medications, given general anesthesia and intubated.  Left knee was  prepped with DuraPrep, draped in sterile manner.  Tourniquet and Bovie  used for hemostasis.  Anterior midline incision made over the left knee  going through skin down to subcutaneous tissue.  Sharp and blunt  dissection made both medial and laterally.  A medial paramedian capsule  incision was made from the tibial tuberosity up to the quadriceps.  The  knee was taken into flexed position.  Patella was subluxed laterally.  Osteophytes about the patella, femur and tibia were resected.  Then soft  tissue release was done.  After adequate soft tissue release then 10 mm  of tibial plateau surface was resected with appropriate guide.  Reaming  down the femoral canal was done and distal femoral cutting jig was put  in place.  Distal femoral cut was made.  Sizing of the femur was found  to be 62.5.  Appropriate cutting jig for anterior, posterior and chamfer  cuts were made.  Trial component was put in place and found to be very  snug fit.  Attention was then turned to the tibial surface which was  measured at 67 mm.  Appropriate cutting jig was put in place and  appropriate cuts were made.  Trial components for both femur and tibial  components were put in place using  the 12 mm poly,  was found to be most  stable full extension, full flexion, good medial and lateral stability.  Next, sizing of the patella was done and was found to be 34 mm and  appropriate cutting jig was put in place.  Appropriate patellar cut was  made.  Trial component was then put in place and range of motion again  was found to be full flexion, full extension with no subluxation of  patella.  All three components were then removed.  Irrigation of the  wound was done.  Methacrylate was mixed.  The patella and the tibial  components were cemented.  The femoral component was press fitted. After  setting the cement, final poly was locked in place which was the 12 mm.  Range of motion was again tested.  It was found to be good and stable,  no lateral subluxation of patella, good medial and lateral stability.  Full flexion, full extension.  Tourniquet was let down.  Hemostasis  obtained.  Wound closure was then done  with 0 Vicryl for the fascia, 2-0  for the subcutaneous and skin was then approximated with 2-0 nylon.  Bulky compressive dressings applied, knee immobilizer applied.  The  patient tolerated procedure quite well, went to recovery room in stable  and satisfactory condition.     Shelly Neither, MD  Electronically Signed    AC/MEDQ  D:  05/25/2006  T:  05/25/2006  Job:  [id number]

## 2010-11-21 NOTE — Discharge Summary (Signed)
Shelly [name], Shelly [name] NO.:  [id number]   MEDICAL RECORD NO.:  [id number]          PATIENT TYPE:  INP   LOCATION:  5041                         FACILITY:  [facility]   PHYSICIAN:  Myrtie Neither, MD      DATE OF BIRTH:  07-29-1918   DATE OF ADMISSION:  08/18/2005  DATE OF DISCHARGE:  08/22/2005                                 DISCHARGE SUMMARY   ADMISSION DIAGNOSIS:  Degenerative arthritis of the right knee.   DISCHARGE DIAGNOSIS:  Degenerative arthritis of the right knee.   COMPLICATIONS:  None.   OPERATIONS:  Right total knee arthroplasty done on 2/13/7.   PERTINENT HISTORY:  This is an 77 year old black female followed in the  office for degenerative joint disease of the past year, right being worse  than the left with persistent pain both on weightbearing as well as  nonweightbearing, and giving way and buckling of the right knee.   PERTINENT PHYSICAL:  Right knee.  Varus deformity crepitus.  Limited range  of motion.  Tender effusion.  New vascular status is intact.   HOSPITAL COURSE:  The patient underwent preoperative medical evaluation and  was found to be stable for surgery, as well as preoperative laboratory.  The  patient had CBC, EKG, chest x-ray, CMT, PT/PTT, platelet count, which was  found to be stable enough for the patient to undergo surgery.  The patient  underwent right total knee arthroplasty, tolerated the procedure well.  Postoperative course was benign.  She started physical therapy and  occupational therapy using CPM.  The patient did receive and pre and post IV  antibiotics using Coumadin.  The patient had prior aggressive therapy with  partial weightbearing on the right side to the pont of being ambulatory with  the use of a walker and had home health managed and health physical therapy  arranged and physical therapy arranged.  Continued on her Coumadin x2 weeks  and home health nurse for INR checks.  The patient was discharged in stable  and satisfactory condition and returned to our office in two weeks.   Return to the office in one week.      Myrtie Neither, MD  Electronically Signed     AC/MEDQ  D:  09/29/2005  T:  09/30/2005  Job:  [id number]

## 2010-11-21 NOTE — Op Note (Signed)
[place]. [facility]  Patient:    Shelly [name], Shelly [name]                        MRN: [id number] Proc. Date: 04/21/00 Adm. Date:  [id number] Disc. Date: [id number] Attending:  Wende Mott                           Operative Report  PREOPERATIVE DIAGNOSIS:  Carpal tunnel syndrome right wrist.  POSTOPERATIVE DIAGNOSIS:  Carpal tunnel syndrome right wrist.  ANESTHESIA:  Bier block.  PROCEDURE:  Right carpal tunnel release.  DESCRIPTION OF PROCEDURE:  The patient was taken to the operating room after given adequate prior medication, given Bier block anesthetic. The right hand and forehand was prepped with DuraPrep and draped in a sterile manner. Tourniquet and bipolar use of hemostasis.  A curvilinear incision was made over the volar aspect of the right wrist going through the skin and subcutaneous tissue. The median nerve was identified proximally. With a Freer placed on the median nerve and underneath the transverse carpal ligament, complete release of the transverse carpal ligament was done. The nerve itself was found to be completely flattened and choked off. It was completely freed up. Copious irrigation was then done followed by wound closure with 4-0 nylon. A compressive dressing was applied. A wrist splint was applied.  The patient tolerated the procedure quite well. The patient did have 8 cc of 1% plain Marcaine injected. The patient went to the recovery room in stable and satisfactory condition.  DISPOSITION:  The patient is being discharged home on Zydone 7.5 one q.4h. p.r.n. for pain, ice packs, elevation, use of sling. Return to the office in one week.  CONDITION ON DISCHARGE:  The patient is being discharged in stable and satisfactory condition. DD:  04/21/00 TD:  04/21/00 Job: [id number]

## 2012-10-21 ENCOUNTER — Inpatient Hospital Stay (HOSPITAL_COMMUNITY)
Admission: EM | Admit: 2012-10-21 | Discharge: 2012-10-23 | DRG: 312 | Disposition: A | Discharge status: Home / Self Care | Payer: Medicare Other | Attending: Internal Medicine | Admitting: Internal Medicine

## 2012-10-21 ENCOUNTER — Emergency Department (HOSPITAL_COMMUNITY): Payer: Medicare Other

## 2012-10-21 ENCOUNTER — Encounter (HOSPITAL_COMMUNITY): Payer: Self-pay | Admitting: *Deleted

## 2012-10-21 DIAGNOSIS — R55 Syncope and collapse: Secondary | ICD-10-CM

## 2012-10-21 DIAGNOSIS — I129 Hypertensive chronic kidney disease with stage 1 through stage 4 chronic kidney disease, or unspecified chronic kidney disease: Secondary | ICD-10-CM | POA: Diagnosis present

## 2012-10-21 DIAGNOSIS — K529 Noninfective gastroenteritis and colitis, unspecified: Secondary | ICD-10-CM

## 2012-10-21 DIAGNOSIS — N189 Chronic kidney disease, unspecified: Secondary | ICD-10-CM | POA: Diagnosis present

## 2012-10-21 DIAGNOSIS — R509 Fever, unspecified: Secondary | ICD-10-CM | POA: Diagnosis present

## 2012-10-21 DIAGNOSIS — I4891 Unspecified atrial fibrillation: Secondary | ICD-10-CM | POA: Diagnosis present

## 2012-10-21 DIAGNOSIS — IMO0002 Reserved for concepts with insufficient information to code with codable children: Secondary | ICD-10-CM

## 2012-10-21 DIAGNOSIS — R531 Weakness: Secondary | ICD-10-CM

## 2012-10-21 DIAGNOSIS — I951 Orthostatic hypotension: Principal | ICD-10-CM | POA: Diagnosis present

## 2012-10-21 DIAGNOSIS — K5289 Other specified noninfective gastroenteritis and colitis: Secondary | ICD-10-CM | POA: Diagnosis present

## 2012-10-21 DIAGNOSIS — Z7901 Long term (current) use of anticoagulants: Secondary | ICD-10-CM

## 2012-10-21 DIAGNOSIS — M069 Rheumatoid arthritis, unspecified: Secondary | ICD-10-CM | POA: Diagnosis present

## 2012-10-21 DIAGNOSIS — I2782 Chronic pulmonary embolism: Secondary | ICD-10-CM | POA: Diagnosis present

## 2012-10-21 DIAGNOSIS — K8020 Calculus of gallbladder without cholecystitis without obstruction: Secondary | ICD-10-CM | POA: Diagnosis present

## 2012-10-21 DIAGNOSIS — Z86711 Personal history of pulmonary embolism: Secondary | ICD-10-CM

## 2012-10-21 DIAGNOSIS — R112 Nausea with vomiting, unspecified: Secondary | ICD-10-CM | POA: Diagnosis present

## 2012-10-21 DIAGNOSIS — I10 Essential (primary) hypertension: Secondary | ICD-10-CM | POA: Diagnosis present

## 2012-10-21 HISTORY — DX: Essential (primary) hypertension: I10

## 2012-10-21 HISTORY — DX: Unspecified osteoarthritis, unspecified site: M19.90

## 2012-10-21 LAB — URINALYSIS, ROUTINE W REFLEX MICROSCOPIC
Bilirubin Urine: NEGATIVE
Glucose, UA: NEGATIVE mg/dL
Ketones, ur: NEGATIVE mg/dL
Leukocytes, UA: NEGATIVE
Nitrite: NEGATIVE
Protein, ur: NEGATIVE mg/dL
Specific Gravity, Urine: 1.016 (ref 1.005–1.030)
Urobilinogen, UA: 1 mg/dL (ref 0.0–1.0)
pH: 7 (ref 5.0–8.0)

## 2012-10-21 LAB — COMPREHENSIVE METABOLIC PANEL
ALT: 8 U/L (ref 0–35)
AST: 20 U/L (ref 0–37)
Albumin: 3.3 g/dL — ABNORMAL LOW (ref 3.5–5.2)
Alkaline Phosphatase: 40 U/L (ref 39–117)
BUN: 19 mg/dL (ref 6–23)
CO2: 24 mEq/L (ref 19–32)
Calcium: 9.3 mg/dL (ref 8.4–10.5)
Chloride: 98 mEq/L (ref 96–112)
Creatinine, Ser: 1.52 mg/dL — ABNORMAL HIGH (ref 0.50–1.10)
GFR calc Af Amer: 33 mL/min — ABNORMAL LOW (ref >90)
GFR calc non Af Amer: 29 mL/min — ABNORMAL LOW (ref >90)
Glucose, Bld: 125 mg/dL — ABNORMAL HIGH (ref 70–99)
Potassium: 3.4 mEq/L — ABNORMAL LOW (ref 3.5–5.1)
Sodium: 134 mEq/L — ABNORMAL LOW (ref 135–145)
Total Bilirubin: 0.7 mg/dL (ref 0.3–1.2)
Total Protein: 7.5 g/dL (ref 6.0–8.3)

## 2012-10-21 LAB — URINE MICROSCOPIC-ADD ON

## 2012-10-21 LAB — CBC
HCT: 35 % — ABNORMAL LOW (ref 36.0–46.0)
Hemoglobin: 12 g/dL (ref 12.0–15.0)
MCH: 24.1 pg — ABNORMAL LOW (ref 26.0–34.0)
MCHC: 34.3 g/dL (ref 30.0–36.0)
MCV: 70.4 fL — ABNORMAL LOW (ref 78.0–100.0)
Platelets: 285 10*3/uL (ref 150–400)
RBC: 4.97 MIL/uL (ref 3.87–5.11)
RDW: 15.3 % (ref 11.5–15.5)
WBC: 10 10*3/uL (ref 4.0–10.5)

## 2012-10-21 LAB — PROTIME-INR
INR: 2.2 — ABNORMAL HIGH (ref 0.00–1.49)
Prothrombin Time: 23.5 seconds — ABNORMAL HIGH (ref 11.6–15.2)

## 2012-10-21 LAB — CG4 I-STAT (LACTIC ACID): Lactic Acid, Venous: 1.24 mmol/L (ref 0.5–2.2)

## 2012-10-21 LAB — TROPONIN I: Troponin I: <0.3 ng/mL (ref <0.30)

## 2012-10-21 MED ORDER — HYDROCORTISONE SOD SUCCINATE 100 MG IJ SOLR
50.0000 mg | Freq: Once | INTRAMUSCULAR | Status: AC
  Administered 2012-10-22: 50 mg via INTRAVENOUS
  Filled 2012-10-21: qty 1

## 2012-10-21 MED ORDER — SODIUM CHLORIDE 0.9 % IV SOLN: INTRAVENOUS | Status: DC

## 2012-10-21 MED ORDER — SODIUM CHLORIDE 0.9 % IV BOLUS (SEPSIS)
500.0000 mL | Freq: Once | INTRAVENOUS | Status: AC
  Administered 2012-10-21: 500 mL via INTRAVENOUS

## 2012-10-21 MED ORDER — POTASSIUM CHLORIDE CRYS ER 20 MEQ PO TBCR: 40.0000 meq | EXTENDED_RELEASE_TABLET | Freq: Once | ORAL | Status: DC

## 2012-10-21 MED ORDER — ONDANSETRON HCL 4 MG/2ML IJ SOLN
4.0000 mg | Freq: Once | INTRAMUSCULAR | Status: AC
  Administered 2012-10-21: 4 mg via INTRAVENOUS
  Filled 2012-10-21: qty 2

## 2012-10-21 MED ORDER — ONDANSETRON HCL 4 MG/2ML IJ SOLN: 4.0000 mg | Freq: Three times a day (TID) | INTRAMUSCULAR | Status: DC | PRN

## 2012-10-21 MED ORDER — POTASSIUM CHLORIDE CRYS ER 20 MEQ PO TBCR
20.0000 meq | EXTENDED_RELEASE_TABLET | Freq: Once | ORAL | Status: AC
  Administered 2012-10-22: 20 meq via ORAL
  Filled 2012-10-21 (×2): qty 1

## 2012-10-21 NOTE — ED Provider Notes (Signed)
History     CSN: [id number]  Arrival date & time 10/21/12  1958   First MD Initiated Contact with Patient 10/21/12 2010      Chief Complaint  Patient presents with  . Loss of Consciousness    (Consider location/radiation/quality/duration/timing/severity/associated sxs/prior treatment) Patient is a 79 y.o. female presenting with syncope. The history is provided by the patient and the EMS personnel. The history is limited by the condition of the patient.  Loss of Consciousness  Associated symptoms include fever, nausea and vomiting. Pertinent negatives include abdominal pain, chest pain and weakness.  pt arrives via ems, w c/o generalized weakness, brief loc, ?fever. Pt w dementia - level 5 caveat, v poor historian.  Pt smiling, alert. Denies any c/o currently. No report of trauma or fall. Per family, pt w nv illness onset yesterday, had several episodes emesis yesterday. Not bloody or bilious. No diarrhea. Today single episode earlier in day. No known ill contacts or bad food ingestion. Pt denies abd pain. No dysuria or gu c/o. Had been nauseated, poor po intake today, was up walking when got generally weak/faint, ?brief syncopal event.  Family also state pt seemed confused today, as compared w her normal. No trauma w episode, family members were aside patient and assisted gently to floor.     No past medical history on file.  No past surgical history on file.  No family history on file.  History  Substance Use Topics  . Smoking status: Not on file  . Smokeless tobacco: Not on file  . Alcohol Use: Not on file    OB History   Grav Para Term Preterm Abortions TAB SAB Ect Mult Living                  Review of Systems  Unable to perform ROS: Dementia  Constitutional: Positive for fever.  HENT: Negative for sore throat, rhinorrhea, neck pain and neck stiffness.   Respiratory: Negative for cough.   Cardiovascular: Positive for syncope. Negative for chest pain.   Gastrointestinal: Positive for nausea and vomiting. Negative for abdominal pain.  Genitourinary: Negative for dysuria.  Skin: Negative for rash.  Neurological: Negative for weakness and numbness.       No headache currently, had c/o earlier per family report  level 5 caveat  Allergies  Review of patient's allergies indicates not on file.  Home Medications  No current outpatient prescriptions on file.  SpO2 97%  Physical Exam  Nursing note and vitals reviewed. Constitutional: She appears well-developed and well-nourished. No distress.  HENT:  Head: Atraumatic.  Nose: Nose normal.  Mouth/Throat: Oropharynx is clear and moist.  Eyes: Conjunctivae are normal. Pupils are equal, round, and reactive to light. No scleral icterus.  Neck: Normal range of motion. Neck supple. No tracheal deviation present.  No stiffness or rigidity.  No bruit.  Cardiovascular: Normal rate, regular rhythm, normal heart sounds and intact distal pulses.   Pulmonary/Chest: Effort normal and breath sounds normal. No respiratory distress.  Abdominal: Soft. Normal appearance and bowel sounds are normal. She exhibits no distension. There is no tenderness.  Genitourinary:  No cva tenderness  Musculoskeletal: She exhibits no edema and no tenderness.  Neurological: She is alert. No cranial nerve deficit.  Sitting up in bed, alert, content. Moves bil ext. Mildly confused, but answers some questions appropriately.   Skin: Skin is warm and dry. No rash noted. She is not diaphoretic.  Psychiatric: She has a normal mood and affect.    ED  Course  Procedures (including critical care time)  Results for orders placed during the hospital encounter of 10/21/12  CBC      Result Value Range   WBC 10.0  4.0 - 10.5 K/uL   RBC 4.97  3.87 - 5.11 MIL/uL   Hemoglobin 12.0  12.0 - 15.0 g/dL   HCT 16.1 (*) 09.6 - 04.5 %   MCV 70.4 (*) 78.0 - 100.0 fL   MCH 24.1 (*) 26.0 - 34.0 pg   MCHC 34.3  30.0 - 36.0 g/dL   RDW 40.9   81.1 - 91.4 %   Platelets 285  150 - 400 K/uL  COMPREHENSIVE METABOLIC PANEL      Result Value Range   Sodium 134 (*) 135 - 145 mEq/L   Potassium 3.4 (*) 3.5 - 5.1 mEq/L   Chloride 98  96 - 112 mEq/L   CO2 24  19 - 32 mEq/L   Glucose, Bld 125 (*) 70 - 99 mg/dL   BUN 19  6 - 23 mg/dL   Creatinine, Ser 7.82 (*) 0.50 - 1.10 mg/dL   Calcium 9.3  8.4 - 95.6 mg/dL   Total Protein 7.5  6.0 - 8.3 g/dL   Albumin 3.3 (*) 3.5 - 5.2 g/dL   AST 20  0 - 37 U/L   ALT 8  0 - 35 U/L   Alkaline Phosphatase 40  39 - 117 U/L   Total Bilirubin 0.7  0.3 - 1.2 mg/dL   GFR calc non Af Amer 29 (*) >90 mL/min   GFR calc Af Amer 33 (*) >90 mL/min  URINALYSIS, ROUTINE W REFLEX MICROSCOPIC      Result Value Range   Color, Urine YELLOW  YELLOW   APPearance CLEAR  CLEAR   Specific Gravity, Urine 1.016  1.005 - 1.030   pH 7.0  5.0 - 8.0   Glucose, UA NEGATIVE  NEGATIVE mg/dL   Hgb urine dipstick SMALL (*) NEGATIVE   Bilirubin Urine NEGATIVE  NEGATIVE   Ketones, ur NEGATIVE  NEGATIVE mg/dL   Protein, ur NEGATIVE  NEGATIVE mg/dL   Urobilinogen, UA 1.0  0.0 - 1.0 mg/dL   Nitrite NEGATIVE  NEGATIVE   Leukocytes, UA NEGATIVE  NEGATIVE  PROTIME-INR      Result Value Range   Prothrombin Time 23.5 (*) 11.6 - 15.2 seconds   INR 2.20 (*) 0.00 - 1.49  TROPONIN I      Result Value Range   Troponin I <0.30  <0.30 ng/mL  URINE MICROSCOPIC-ADD ON      Result Value Range   Squamous Epithelial / LPF RARE  RARE   RBC / HPF 7-10  <3 RBC/hpf  CG4 I-STAT (LACTIC ACID)      Result Value Range   Lactic Acid, Venous 1.24  0.5 - 2.2 mmol/L   Dg Chest 2 View  10/21/2012  *RADIOLOGY REPORT*  Clinical Data: Fever, headache, syncope, altered mental status  CHEST - 2 VIEW  Comparison: 07/28/2010  Findings: Low lung volumes with mild bibasilar atelectasis.  Increased interstitial markings/perihilar opacities, chronic.  No focal consolidation.  No pleural effusion or pneumothorax.  The heart is top normal in size.  Degenerative  changes of the visualized thoracolumbar spine.  IMPRESSION: Low lung volumes with mild bibasilar atelectasis.  Underlying chronic interstitial markings.   Original Report Authenticated By: Charline Bills, M.D.    Ct Head Wo Contrast  10/21/2012  *RADIOLOGY REPORT*  Clinical Data: Confusion.  Diarrhea and vomiting.  Headache.  CT HEAD  WITHOUT CONTRAST  Technique:  Contiguous axial images were obtained from the base of the skull through the vertex without contrast.  Comparison: CT of the head performed 07/28/2010, and MRI of the brain performed 08/18/2010  Findings: There is no evidence of acute infarction, mass lesion, or intra- or extra-axial hemorrhage on CT.  Prominence of the ventricles and sulci reflects mild age- appropriate cortical volume loss.  Diffuse periventricular and subcortical white matter change likely reflects small vessel ischemic microangiopathy.  A small stable small focus of calcification is noted along the mid falx cerebri.  Calcification is noted within the basal ganglia bilaterally.  The brainstem and fourth ventricle are within normal limits.  The cerebral hemispheres demonstrate grossly normal gray-white differentiation.  No mass effect or midline shift is seen.  There is no evidence of fracture; a vague lucency within the right posterior arch of C1 is stable from 2010 and likely benign.  The visualized portions of the orbits are within normal limits.  The paranasal sinuses and mastoid air cells are well-aerated.  No significant soft tissue abnormalities are seen.  IMPRESSION:  1.  No acute intracranial pathology seen on CT. 2.  Mild age-appropriate cortical volume loss and diffuse small vessel ischemic microangiopathy. 3.  Vague lucency within the right posterior arch of C1 is stable from 2010 and likely benign.   Original Report Authenticated By: Tonia Ghent, M.D.        MDM  Iv ns. Labs. Ua. Cxr.  Reviewed nursing notes and prior charts for additional history.      Date: 10/21/2012  Rate: 97  Rhythm: sinus arrhythmia  QRS Axis: normal  Intervals: normal  ST/T Wave abnormalities: nonspecific ST/T changes  Conduction Disutrbances:none  Narrative Interpretation:   Old EKG Reviewed: unchanged   Additional ns iv. zofran iv.  Recheck abd soft nt.  Discussed labs w pt.   Given gen weakness, recent nv ?dehydration, syncopal event at home, will admit.  [facility] called.        Suzi Roots, MD 10/21/12 [id number]

## 2012-10-21 NOTE — ED Notes (Signed)
Spoke with pt's family members at bedside (daughters),  Pt is confused at this time and per family that is not normal for her,  Dr Denton Lank aware and at bedside also speaking with family.  Also reported that  Pt had a lot of diarrhea yesterday and some vomiting,  Only a small amount of vomiting and diarrhea today,  Pt continues to deny pain but family states that she has complained of HA for two days

## 2012-10-21 NOTE — ED Notes (Signed)
ZOX:[id number]<AV> Expected date:10/21/12<BR> Expected time: 7:50 PM<BR> Means of arrival:Ambulance<BR> Comments:<BR> RM 11: weakness, n/v, fever, syncope

## 2012-10-21 NOTE — H&P (Signed)
[facility] History and Physical  [name]:[id number] DOB: Jun 13, 1919 DOA: 10/21/2012  Referring physician: Dr.Stienel. PCP: Ron Parker, MD  Specialists: None.  Chief Complaint: Loss of consciousness.  HPI: Shelly [name] is a 79 y.o. female history of hypertension, PE on Coumadin, rheumatoid arthritis on prednisone was brought to the ER after patient had brief spell of loss of consciousness. As per patient's daughter who provided the history patient has been having nausea vomiting for last 2 days. Today after dinner when patient was walking back to the den she suddenly felt weak and passed out. Patient's daughter was standing beside her and held her before she had the floor. She lost consciousness for less than a minute and did not have any focal deficits or any seizure-like activities. When she was brought to the ER she was found mildly febrile. Chest x-ray and UA are unremarkable. CT head was negative for any acute. EKG shows sinus rhythm. Patient at this time has been admitted for further management. Patient denies any chest pain or shortness of breath. Denies any abdominal pain or diarrhea.  Review of Systems: As presented in the history of presenting illness, rest negative.  Past Medical History  Diagnosis Date  . Arthritis   . Hypertension   . H/O blood clots   . Bronchitis    Past Surgical History  Procedure Laterality Date  . Tubaligation    . Replacement total knee bilateral     Social History:  reports that she has never smoked. She does not have any smokeless tobacco history on file. She reports that she does not drink alcohol or use illicit drugs. Lives at home. where does patient live-- Can do ADLs. Can patient participate in ADLs?  No Known Allergies  Family History  Problem Relation Age of Onset  . Hypertension Daughter       Prior to Admission medications   Medication Sig Start Date End Date Taking? Authorizing Provider  calcium-vitamin D  (OSCAL WITH D) 500-200 MG-UNIT per tablet Take 1 tablet by mouth 2 (two) times daily.   Yes Historical Provider, MD  diltiazem (CARDIZEM) 60 MG tablet Take 60 mg by mouth 3 (three) times daily.   Yes Historical Provider, MD  HYDROcodone-acetaminophen (NORCO/VICODIN) 5-325 MG per tablet Take 1 tablet by mouth every 6 (six) hours as needed for pain.   Yes Historical Provider, MD  mirtazapine (REMERON) 15 MG tablet Take 15 mg by mouth at bedtime.   Yes Historical Provider, MD  predniSONE (DELTASONE) 5 MG tablet Take by mouth daily.   Yes Historical Provider, MD  ranitidine (ZANTAC) 150 MG tablet Take 150 mg by mouth daily.   Yes Historical Provider, MD  triamterene-hydrochlorothiazide (MAXZIDE-25) 37.5-25 MG per tablet Take 1 tablet by mouth every morning.   Yes Historical Provider, MD  warfarin (COUMADIN) 4 MG tablet Take 4-6 mg by mouth daily. Takes 1 tablet(4mg ) daily except takes 1.5tablets(6mg ) on sundays and tuesdays   Yes Historical Provider, MD   Physical Exam: Filed Vitals:   10/21/12 2003 10/21/12 2015 10/21/12 2054 10/21/12 2200  BP:  147/78  134/40  Pulse:  50  79  Temp:  99.6 F (37.6 C) 100.5 F (38.1 C)   TempSrc:  Oral Rectal   Resp:  18  31  Height:  5\' 1"  (1.549 m)    Weight:  74.844 kg (165 lb)    SpO2: 97% 97%  96%     General:  Well-developed well-nourished.  Eyes: Anicteric no pallor.  ENT: No discharge from the ears eyes nose or mouth. No facial asymmetry.  Neck: No mass felt.  Cardiovascular: S1-S2 heard.  Respiratory: No rhonchi or crepitations.  Abdomen: Soft nontender bowel sounds present.  Skin: No rash.  Musculoskeletal: No edema.  Psychiatric: Appears normal.  Neurologic: Alert awake oriented to time place and person. Moves all extremities.  Labs on Admission:  Basic Metabolic Panel:  Recent Labs Lab 10/21/12 2018  NA 134*  K 3.4*  CL 98  CO2 24  GLUCOSE 125*  BUN 19  CREATININE 1.52*  CALCIUM 9.3   Liver Function  Tests:  Recent Labs Lab 10/21/12 2018  AST 20  ALT 8  ALKPHOS 40  BILITOT 0.7  PROT 7.5  ALBUMIN 3.3*   No results found for this basename: LIPASE, AMYLASE,  in the last 168 hours No results found for this basename: AMMONIA,  in the last 168 hours CBC:  Recent Labs Lab 10/21/12 2018  WBC 10.0  HGB 12.0  HCT 35.0*  MCV 70.4*  PLT 285   Cardiac Enzymes:  Recent Labs Lab 10/21/12 2018  TROPONINI <0.30    BNP (last 3 results) No results found for this basename: PROBNP,  in the last 8760 hours CBG: No results found for this basename: GLUCAP,  in the last 168 hours  Radiological Exams on Admission: Dg Chest 2 View  10/21/2012  *RADIOLOGY REPORT*  Clinical Data: Fever, headache, syncope, altered mental status  CHEST - 2 VIEW  Comparison: 07/28/2010  Findings: Low lung volumes with mild bibasilar atelectasis.  Increased interstitial markings/perihilar opacities, chronic.  No focal consolidation.  No pleural effusion or pneumothorax.  The heart is top normal in size.  Degenerative changes of the visualized thoracolumbar spine.  IMPRESSION: Low lung volumes with mild bibasilar atelectasis.  Underlying chronic interstitial markings.   Original Report Authenticated By: Charline Bills, M.D.    Ct Head Wo Contrast  10/21/2012  *RADIOLOGY REPORT*  Clinical Data: Confusion.  Diarrhea and vomiting.  Headache.  CT HEAD WITHOUT CONTRAST  Technique:  Contiguous axial images were obtained from the base of the skull through the vertex without contrast.  Comparison: CT of the head performed 07/28/2010, and MRI of the brain performed 08/18/2010  Findings: There is no evidence of acute infarction, mass lesion, or intra- or extra-axial hemorrhage on CT.  Prominence of the ventricles and sulci reflects mild age- appropriate cortical volume loss.  Diffuse periventricular and subcortical white matter change likely reflects small vessel ischemic microangiopathy.  A small stable small focus of  calcification is noted along the mid falx cerebri.  Calcification is noted within the basal ganglia bilaterally.  The brainstem and fourth ventricle are within normal limits.  The cerebral hemispheres demonstrate grossly normal gray-white differentiation.  No mass effect or midline shift is seen.  There is no evidence of fracture; a vague lucency within the right posterior arch of C1 is stable from 2010 and likely benign.  The visualized portions of the orbits are within normal limits.  The paranasal sinuses and mastoid air cells are well-aerated.  No significant soft tissue abnormalities are seen.  IMPRESSION:  1.  No acute intracranial pathology seen on CT. 2.  Mild age-appropriate cortical volume loss and diffuse small vessel ischemic microangiopathy. 3.  Vague lucency within the right posterior arch of C1 is stable from 2010 and likely benign.   Original Report Authenticated By: Tonia Ghent, M.D.     EKG: Independently reviewed. Normal sinus rhythm.  Assessment/Plan Principal Problem:  Syncope Active Problems:   History of pulmonary embolism   Rheumatoid arthritis   Hypertension   Fever   1. Syncope - cause not clear. At this time we will monitor in telemetry. Patient does have history of atrial fibrillation as per family presently the monitor shows sinus rhythm with occasional A. fib like rhythm. Check orthostatics. Gently hydrate for now. Most likely cause could be mild dehydration from her nausea vomiting. 2. Nausea and vomiting - patient denies any abdominal pain. Possibly from gastroenteritis. Patient is still nauseous. Check sonogram of the abdomen for any gallbladder pathology. Check lipase. 3. Mild fever - UA and chest x-ray are unremarkable. I have ordered blood cultures. The mild fever could be from possible gastroenteritis. 4. Chronic steroids use secondary to rheumatoid arthritis - patient has not taken her prednisone dose today due to vomiting. I have ordered one dose of  hydrocortisone IV for now. Closely observe vital signs. 5. Chronic kidney disease - creatinine appears at baseline. Closely follow intake output. 6. History of atrial fibrillation - presently in sinus rhythm. On Coumadin. 7. History of PE - on Coumadin per pharmacy.    Code Status: Full code.  Family Communication: Patient's daughter at bedside.  Disposition Plan: Admit for observation.    Kejon Feild N. [facility] Pager [phone].  If 7PM-7AM, please contact night-coverage [email or web address] Password [facility] 10/21/2012, 11:20 PM

## 2012-10-21 NOTE — ED Notes (Signed)
Bedside report received from previous RN, Anna. 

## 2012-10-21 NOTE — ED Notes (Signed)
Per report from EMS,  Pt is from home where she does her activities of daily living with minimal assistance,  Pt has had nausea , vomiting and chills for past few days, pt has no complaints of pain.  Per family she passed out in their arms for approximately 5 minutes,

## 2012-10-21 NOTE — ED Notes (Addendum)
Report called to floor RN, Monica 

## 2012-10-21 NOTE — ED Notes (Signed)
Patient returned from X-ray 

## 2012-10-22 ENCOUNTER — Encounter (HOSPITAL_COMMUNITY): Payer: Self-pay | Admitting: *Deleted

## 2012-10-22 ENCOUNTER — Observation Stay (HOSPITAL_COMMUNITY): Payer: Medicare Other

## 2012-10-22 LAB — CBC
HCT: 34.5 % — ABNORMAL LOW (ref 36.0–46.0)
Hemoglobin: 11.8 g/dL — ABNORMAL LOW (ref 12.0–15.0)
MCH: 24.3 pg — ABNORMAL LOW (ref 26.0–34.0)
MCHC: 34.2 g/dL (ref 30.0–36.0)
MCV: 71.1 fL — ABNORMAL LOW (ref 78.0–100.0)
Platelets: 236 10*3/uL (ref 150–400)
RBC: 4.85 MIL/uL (ref 3.87–5.11)
RDW: 15.3 % (ref 11.5–15.5)
WBC: 9.1 10*3/uL (ref 4.0–10.5)

## 2012-10-22 LAB — BASIC METABOLIC PANEL
BUN: 16 mg/dL (ref 6–23)
CO2: 23 mEq/L (ref 19–32)
Calcium: 8.6 mg/dL (ref 8.4–10.5)
Chloride: 95 mEq/L — ABNORMAL LOW (ref 96–112)
Creatinine, Ser: 1.29 mg/dL — ABNORMAL HIGH (ref 0.50–1.10)
GFR calc Af Amer: 41 mL/min — ABNORMAL LOW (ref >90)
GFR calc non Af Amer: 35 mL/min — ABNORMAL LOW (ref >90)
Glucose, Bld: 94 mg/dL (ref 70–99)
Potassium: 3.4 mEq/L — ABNORMAL LOW (ref 3.5–5.1)
Sodium: 130 mEq/L — ABNORMAL LOW (ref 135–145)

## 2012-10-22 LAB — PROTIME-INR
INR: 2.37 — ABNORMAL HIGH (ref 0.00–1.49)
Prothrombin Time: 24.8 seconds — ABNORMAL HIGH (ref 11.6–15.2)

## 2012-10-22 LAB — LIPASE, BLOOD: Lipase: 38 U/L (ref 11–59)

## 2012-10-22 LAB — TROPONIN I: Troponin I: <0.3 ng/mL (ref <0.30)

## 2012-10-22 MED ORDER — ACETAMINOPHEN 650 MG RE SUPP: 650.0000 mg | Freq: Four times a day (QID) | RECTAL | Status: DC | PRN

## 2012-10-22 MED ORDER — SODIUM CHLORIDE 0.9 % IV SOLN
INTRAVENOUS | Status: DC
  Administered 2012-10-22 (×2): 100 mL/h via INTRAVENOUS

## 2012-10-22 MED ORDER — PREDNISONE 5 MG PO TABS
5.0000 mg | ORAL_TABLET | Freq: Every day | ORAL | Status: DC
  Administered 2012-10-22 – 2012-10-23 (×2): 5 mg via ORAL
  Filled 2012-10-22 (×4): qty 1

## 2012-10-22 MED ORDER — SODIUM CHLORIDE 0.9 % IV SOLN
INTRAVENOUS | Status: AC
  Administered 2012-10-22 (×2): via INTRAVENOUS

## 2012-10-22 MED ORDER — SODIUM CHLORIDE 0.9 % IJ SOLN
3.0000 mL | Freq: Two times a day (BID) | INTRAMUSCULAR | Status: DC
  Administered 2012-10-22: 3 mL via INTRAVENOUS

## 2012-10-22 MED ORDER — SODIUM CHLORIDE 0.9 % IV BOLUS (SEPSIS)
500.0000 mL | Freq: Once | INTRAVENOUS | Status: AC
  Administered 2012-10-22: 500 mL via INTRAVENOUS

## 2012-10-22 MED ORDER — DILTIAZEM HCL 60 MG PO TABS
60.0000 mg | ORAL_TABLET | Freq: Three times a day (TID) | ORAL | Status: DC
  Administered 2012-10-22 – 2012-10-23 (×4): 60 mg via ORAL
  Filled 2012-10-22 (×6): qty 1

## 2012-10-22 MED ORDER — MIRTAZAPINE 15 MG PO TABS
15.0000 mg | ORAL_TABLET | Freq: Every day | ORAL | Status: DC
  Administered 2012-10-22 (×2): 15 mg via ORAL
  Filled 2012-10-22 (×3): qty 1

## 2012-10-22 MED ORDER — ACETAMINOPHEN 325 MG PO TABS: 650.0000 mg | ORAL_TABLET | Freq: Four times a day (QID) | ORAL | Status: DC | PRN

## 2012-10-22 MED ORDER — FAMOTIDINE 20 MG PO TABS
20.0000 mg | ORAL_TABLET | Freq: Two times a day (BID) | ORAL | Status: DC
  Administered 2012-10-22 – 2012-10-23 (×4): 20 mg via ORAL
  Filled 2012-10-22 (×5): qty 1

## 2012-10-22 MED ORDER — POTASSIUM CHLORIDE CRYS ER 20 MEQ PO TBCR
40.0000 meq | EXTENDED_RELEASE_TABLET | Freq: Once | ORAL | Status: AC
  Administered 2012-10-22: 40 meq via ORAL
  Filled 2012-10-22: qty 2

## 2012-10-22 MED ORDER — HYDROCODONE-ACETAMINOPHEN 5-325 MG PO TABS: 1.0000 | ORAL_TABLET | Freq: Four times a day (QID) | ORAL | Status: DC | PRN

## 2012-10-22 MED ORDER — WARFARIN SODIUM 4 MG PO TABS
4.0000 mg | ORAL_TABLET | ORAL | Status: DC
  Administered 2012-10-22: 4 mg via ORAL
  Filled 2012-10-22: qty 1

## 2012-10-22 MED ORDER — WARFARIN - PHARMACIST DOSING INPATIENT: Freq: Every day | Status: DC

## 2012-10-22 MED ORDER — ONDANSETRON HCL 4 MG PO TABS
4.0000 mg | ORAL_TABLET | Freq: Four times a day (QID) | ORAL | Status: DC | PRN
  Administered 2012-10-22: 4 mg via ORAL
  Filled 2012-10-22: qty 1

## 2012-10-22 MED ORDER — WARFARIN SODIUM 6 MG PO TABS
6.0000 mg | ORAL_TABLET | ORAL | Status: DC
  Filled 2012-10-22: qty 1

## 2012-10-22 MED ORDER — ONDANSETRON HCL 4 MG/2ML IJ SOLN: 4.0000 mg | Freq: Four times a day (QID) | INTRAMUSCULAR | Status: DC | PRN

## 2012-10-22 NOTE — Progress Notes (Signed)
ANTICOAGULATION CONSULT NOTE - Initial Consult  Pharmacy Consult for warfarin Indication: VTE treatment  No Known Allergies  Patient Measurements: Height: 5\' 1"  (154.9 cm) Weight: 151 lb 0.2 oz (68.5 kg) IBW/kg (Calculated) : 47.8 Heparin Dosing Weight:   Vital Signs: Temp: 98.7 F (37.1 C) (04/19 0146) Temp src: Oral (04/19 0146) BP: 122/86 mmHg (04/19 0146) Pulse Rate: 90 (04/19 0146)  Labs:  Recent Labs  10/21/12 2018  HGB 12.0  HCT 35.0*  PLT 285  LABPROT 23.5*  INR 2.20*  CREATININE 1.52*  TROPONINI <0.30    Estimated Creatinine Clearance: 21.4 ml/min (by C-G formula based on Cr of 1.52).   Medical History: Past Medical History  Diagnosis Date  . Arthritis   . Hypertension   . H/O blood clots   . Bronchitis     Medications:  Prescriptions prior to admission  Medication Sig Dispense Refill  . calcium-vitamin D (OSCAL WITH D) 500-200 MG-UNIT per tablet Take 1 tablet by mouth 2 (two) times daily.      Marland Kitchen diltiazem (CARDIZEM) 60 MG tablet Take 60 mg by mouth 3 (three) times daily.      Marland Kitchen HYDROcodone-acetaminophen (NORCO/VICODIN) 5-325 MG per tablet Take 1 tablet by mouth every 6 (six) hours as needed for pain.      . mirtazapine (REMERON) 15 MG tablet Take 15 mg by mouth at bedtime.      . predniSONE (DELTASONE) 5 MG tablet Take by mouth daily.      . ranitidine (ZANTAC) 150 MG tablet Take 150 mg by mouth daily.      Marland Kitchen triamterene-hydrochlorothiazide (MAXZIDE-25) 37.5-25 MG per tablet Take 1 tablet by mouth every morning.      . warfarin (COUMADIN) 4 MG tablet Take 4-6 mg by mouth daily. Takes 1 tablet(4mg ) daily except takes 1.5tablets(6mg ) on sundays and tuesdays       Scheduled:  . diltiazem  60 mg Oral TID  . famotidine  20 mg Oral BID  . [COMPLETED] hydrocortisone sod succinate (SOLU-CORTEF) injection  50 mg Intravenous Once  . mirtazapine  15 mg Oral QHS  . [COMPLETED] ondansetron (ZOFRAN) IV  4 mg Intravenous Once  . [COMPLETED] potassium  chloride  20 mEq Oral Once  . predniSONE  5 mg Oral Q breakfast  . [COMPLETED] sodium chloride  500 mL Intravenous Once  . [COMPLETED] sodium chloride  500 mL Intravenous Once  . sodium chloride  3 mL Intravenous Q12H  . warfarin  4 mg Oral Custom  . [START ON 10/23/2012] warfarin  6 mg Oral Custom  . Warfarin - Pharmacist Dosing Inpatient   Does not apply q1800  . [DISCONTINUED] sodium chloride   Intravenous STAT  . [DISCONTINUED] potassium chloride  40 mEq Oral Once    Assessment: Patient on chronic warfarin for VTE treatment.  Pharmacy asked to dose warfarin.  INR at goal.  Goal of Therapy:  INR 2-3    Plan:  Continue home warfarin dosing. Daily INR  Aleene Davidson Crowford 10/22/2012,1:49 AM

## 2012-10-22 NOTE — Progress Notes (Signed)
Pt had burst of SVT on telemetry monitor, 18 beats of SVt, 14 beats of SVT. Pt asymptomatic, VSS. Daphane Shepherd, NP notified. No new orders at this time.  Will continue to monitor. Newman Nip Lilburn

## 2012-10-22 NOTE — Progress Notes (Signed)
  Echocardiogram 2D Echocardiogram has been performed.  Shelly [name] 10/22/2012, 10:02 AM

## 2012-10-22 NOTE — Progress Notes (Signed)
[facility] PROGRESS NOTE  Shelly [name] ZOX:[id number] DOB: 06-Dec-1918 DOA: 10/21/2012 PCP: Ron Parker, MD  Assessment/Plan: 1.Orthostatic Syncope  - Will bolus with normal saline, continue hydration follow and recheck orthostatics -Cardiac enzymes negative -2-D echo with EF of 65-70% no wall motion abnormalities, grade 1 diastolic dysfunction noted. 2.Nausea and vomiting -Possibly secondary to gastroenteritis, clinically improved -She does have gallstones but has had no abdominal pain, and is nontender on abdominal exam -Serum lipase is negative 3.Mild fever  - UA and chest x-ray are unremarkable. -blood cultures pending.  -The mild fever could be from possible gastroenteritis. -She has no evidence of cholecystitis on ultrasound. -Patient so far afebrile today. 4.Chronic steroids use secondary to rheumatoid arthritis -Continue prednisone  5.Chronic kidney disease  - creatinine improved with hydration 6.History of atrial fibrillation  - presently in sinus rhythm.  -INR therapeutic 7.History of PE - on Coumadin per pharmacy. 8. Cholelithiasis -without evidence of cholecystitis per ultrasound, and without right upper quadrant tenderness or abdominal pain -LFTs within normal limits    Code Status: full Family Communication: with daughter at bedside Disposition Plan: to home when medically stable   Consultants:  none  Procedures:  Echo - EF 65-70%, no regional wall motion abnl. Grade 1 diastolic dysfunction.  Antibiotics:  None  HPI/Subjective: Pt denies CP, no SOB. She had some nausea earlier today but has had no further vomiting. She is tolerating by mouth well. She denies dizziness.  Objective: Filed Vitals:   10/22/12 0146 10/22/12 0640 10/22/12 0645 10/22/12 0650  BP: 122/86 125/78 120/65 115/63  Pulse: 90 73 63 107  Temp: 98.7 F (37.1 C) 98.2 F (36.8 C)    TempSrc: Oral Oral    Resp: 20     Height:      Weight:      SpO2: 95% 98% 99%  99%    Intake/Output Summary (Last 24 hours) at 10/22/12 0912 Last data filed at 10/22/12 0700  Gross per 24 hour  Intake 608.75 ml  Output    450 ml  Net 158.75 ml   Filed Weights   10/21/12 2015 10/21/12 2300  Weight: 74.844 kg (165 lb) 68.5 kg (151 lb 0.2 oz)    Exam:   General:  Elderly female, in no apparent distress  Cardiovascular: Regular rate and rhythm, S1-S2  Respiratory: Clear to auscultation bilaterally no crackles or wheezes  Abdomen: Soft, bowel sounds present nontender nondistended no organomegaly and no masses palpable  Extremities: No cyanosis and no edema  Data Reviewed: Basic Metabolic Panel:  Recent Labs Lab 10/21/12 2018 10/22/12 0215  NA 134* 130*  K 3.4* 3.4*  CL 98 95*  CO2 24 23  GLUCOSE 125* 94  BUN 19 16  CREATININE 1.52* 1.29*  CALCIUM 9.3 8.6   Liver Function Tests:  Recent Labs Lab 10/21/12 2018  AST 20  ALT 8  ALKPHOS 40  BILITOT 0.7  PROT 7.5  ALBUMIN 3.3*    Recent Labs Lab 10/22/12 0215  LIPASE 38   No results found for this basename: AMMONIA,  in the last 168 hours CBC:  Recent Labs Lab 10/21/12 2018 10/22/12 0215  WBC 10.0 9.1  HGB 12.0 11.8*  HCT 35.0* 34.5*  MCV 70.4* 71.1*  PLT 285 236   Cardiac Enzymes:  Recent Labs Lab 10/21/12 2018 10/22/12 0215  TROPONINI <0.30 <0.30   BNP (last 3 results) No results found for this basename: PROBNP,  in the last 8760 hours CBG: No results found for this  basename: GLUCAP,  in the last 168 hours  No results found for this or any previous visit (from the past 240 hour(s)).   Studies: Dg Chest 2 View  10/21/2012  *RADIOLOGY REPORT*  Clinical Data: Fever, headache, syncope, altered mental status  CHEST - 2 VIEW  Comparison: 07/28/2010  Findings: Low lung volumes with mild bibasilar atelectasis.  Increased interstitial markings/perihilar opacities, chronic.  No focal consolidation.  No pleural effusion or pneumothorax.  The heart is top normal in size.   Degenerative changes of the visualized thoracolumbar spine.  IMPRESSION: Low lung volumes with mild bibasilar atelectasis.  Underlying chronic interstitial markings.   Original Report Authenticated By: Charline Bills, M.D.    Ct Head Wo Contrast  10/21/2012  *RADIOLOGY REPORT*  Clinical Data: Confusion.  Diarrhea and vomiting.  Headache.  CT HEAD WITHOUT CONTRAST  Technique:  Contiguous axial images were obtained from the base of the skull through the vertex without contrast.  Comparison: CT of the head performed 07/28/2010, and MRI of the brain performed 08/18/2010  Findings: There is no evidence of acute infarction, mass lesion, or intra- or extra-axial hemorrhage on CT.  Prominence of the ventricles and sulci reflects mild age- appropriate cortical volume loss.  Diffuse periventricular and subcortical white matter change likely reflects small vessel ischemic microangiopathy.  A small stable small focus of calcification is noted along the mid falx cerebri.  Calcification is noted within the basal ganglia bilaterally.  The brainstem and fourth ventricle are within normal limits.  The cerebral hemispheres demonstrate grossly normal gray-white differentiation.  No mass effect or midline shift is seen.  There is no evidence of fracture; a vague lucency within the right posterior arch of C1 is stable from 2010 and likely benign.  The visualized portions of the orbits are within normal limits.  The paranasal sinuses and mastoid air cells are well-aerated.  No significant soft tissue abnormalities are seen.  IMPRESSION:  1.  No acute intracranial pathology seen on CT. 2.  Mild age-appropriate cortical volume loss and diffuse small vessel ischemic microangiopathy. 3.  Vague lucency within the right posterior arch of C1 is stable from 2010 and likely benign.   Original Report Authenticated By: Tonia Ghent, M.D.    US Abdomen Complete  10/22/2012  *RADIOLOGY REPORT*  Clinical Data:  Nausea, vomiting  COMPLETE  ABDOMINAL ULTRASOUND  Comparison:  None.  Findings:  Gallbladder:  Multiple mobile echogenic shadowing foci within the gallbladder lung lumen consistent with cholelithiasis.  No gallbladder wall thickening, or pericholecystic fluid.  Per the sonographer, as sonographic Murphy's sign was negative.  Common bile duct:  The common bile duct is within normal limits at 4.6 mm in caliber.  Liver:  No focal lesion identified.  Within normal limits in parenchymal echogenicity.  IVC:  Appears normal.  Pancreas:  Partially obscured by overlying bowel gas.  The pancreatic duct is visualized and is minimally prominent at 3.3 mm.  Spleen:  Within normal limits at 7.9 cm in length.  No focal abnormality.  Right Kidney:  Circumscribed, anechoic simple cyst measures 2.4 x 2.0 x 2.0 cm exophytic from the lower pole.  No hydronephrosis. The kidney measures 10 cm in length.  The renal parenchyma may be minimally echogenic.  Left Kidney:  Normal reniform contour without hydronephrosis or solid renal lesion.  The renal parenchyma is minimally echogenic. Renal length is 10.3 cm.  Abdominal aorta:  Within normal limits.  No aneurysm identified.  IMPRESSION:  1.  Cholelithiasis without sonographic findings  to suggest acute cholecystitis. 2.  Slightly echogenic renal parenchyma bilaterally as can be seen with underlying medical renal disease. 3.  Right renal cyst.   Original Report Authenticated By: Malachy Moan, M.D.     Scheduled Meds: . diltiazem  60 mg Oral TID  . famotidine  20 mg Oral BID  . mirtazapine  15 mg Oral QHS  . predniSONE  5 mg Oral Q breakfast  . sodium chloride  3 mL Intravenous Q12H  . warfarin  4 mg Oral Custom  . [START ON 10/23/2012] warfarin  6 mg Oral Custom  . Warfarin - Pharmacist Dosing Inpatient   Does not apply q1800   Continuous Infusions: . sodium chloride 75 mL/hr at 10/22/12 0053    Principal Problem:   Syncope Active Problems:   History of pulmonary embolism   Rheumatoid arthritis    Hypertension   Fever    Time spent: 72    [facility]  [facility] Pager [phone]. If 7PM-7AM, please contact night-coverage at [email or web address], password [facility] 10/22/2012, 9:12 AM  LOS: 1 day

## 2012-10-23 DIAGNOSIS — K5289 Other specified noninfective gastroenteritis and colitis: Secondary | ICD-10-CM

## 2012-10-23 DIAGNOSIS — R509 Fever, unspecified: Secondary | ICD-10-CM

## 2012-10-23 DIAGNOSIS — R5381 Other malaise: Secondary | ICD-10-CM

## 2012-10-23 LAB — BASIC METABOLIC PANEL
BUN: 17 mg/dL (ref 6–23)
CO2: 22 mEq/L (ref 19–32)
Calcium: 8.4 mg/dL (ref 8.4–10.5)
Chloride: 109 mEq/L (ref 96–112)
Creatinine, Ser: 1.36 mg/dL — ABNORMAL HIGH (ref 0.50–1.10)
GFR calc Af Amer: 38 mL/min — ABNORMAL LOW (ref >90)
GFR calc non Af Amer: 33 mL/min — ABNORMAL LOW (ref >90)
Glucose, Bld: 88 mg/dL (ref 70–99)
Potassium: 4 mEq/L (ref 3.5–5.1)
Sodium: 138 mEq/L (ref 135–145)

## 2012-10-23 LAB — PROTIME-INR
INR: 2.68 — ABNORMAL HIGH (ref 0.00–1.49)
Prothrombin Time: 27.2 seconds — ABNORMAL HIGH (ref 11.6–15.2)

## 2012-10-23 MED ORDER — SODIUM CHLORIDE 0.9 % IV BOLUS (SEPSIS)
500.0000 mL | Freq: Once | INTRAVENOUS | Status: AC
  Administered 2012-10-23: 500 mL via INTRAVENOUS

## 2012-10-23 NOTE — Evaluation (Signed)
Physical Therapy Evaluation Patient Details Name: Shelly [name] MRN: [id number] DOB: [birth year] Today's Date: 10/23/2012 Time: 4098-1191 PT Time Calculation (min): 30 min  PT Assessment / Plan / Recommendation Clinical Impression  Pt admitted with dx of syncope and currently performing all mobility tasks with min guard to S level of assist and only min cues for saftey.  Pt plans d/c home with assist of dtr and spouse    PT Assessment  Patient needs continued PT services    Follow Up Recommendations  No PT follow up    Does the patient have the potential to tolerate intense rehabilitation      Barriers to Discharge None      Equipment Recommendations  None recommended by PT    Recommendations for Other Services     Frequency Min 3X/week    Precautions / Restrictions Precautions Precautions: Fall Restrictions Weight Bearing Restrictions: No   Pertinent Vitals/Pain Pt denies any pain at this time      Mobility  Bed Mobility Bed Mobility: Not assessed Transfers Transfers: Sit to Stand;Stand to Sit Sit to Stand: 4: Min guard Stand to Sit: 4: Min guard Details for Transfer Assistance: cues for LE management and use of UEs to self assist Ambulation/Gait Ambulation/Gait Assistance: 4: Min guard Ambulation Distance (Feet): 200 Feet Assistive device: Rolling walker Ambulation/Gait Assistance Details: min cues for posture and position from RW Gait Pattern: Step-to pattern;Step-through pattern    Exercises     PT Diagnosis: Difficulty walking  PT Problem List: Decreased activity tolerance;Decreased mobility;Decreased knowledge of use of DME PT Treatment Interventions: DME instruction;Gait training;Stair training;Functional mobility training;Therapeutic activities;Patient/family education   PT Goals Acute Rehab PT Goals PT Goal Formulation: With patient Time For Goal Achievement: 10/28/12 Potential to Achieve Goals: Good Pt will go Supine/Side to Sit: with modified  independence PT Goal: Supine/Side to Sit - Progress: Goal set today Pt will go Sit to Supine/Side: with modified independence PT Goal: Sit to Supine/Side - Progress: Goal set today Pt will go Sit to Stand: with modified independence PT Goal: Sit to Stand - Progress: Goal set today Pt will go Stand to Sit: with modified independence PT Goal: Stand to Sit - Progress: Goal set today Pt will Ambulate: 51 - 150 feet;with supervision;with rolling walker PT Goal: Ambulate - Progress: Goal set today Pt will Go Up / Down Stairs: 1-2 stairs;with min assist;with least restrictive assistive device PT Goal: Up/Down Stairs - Progress: Goal set today  Visit Information  Last PT Received On: 10/23/12 Assistance Needed: +1    Subjective Data  Subjective: I hate this walker but I know I will be using it for a long time  Patient Stated Goal: HOME   Prior Functioning  Home Living Lives With: Spouse Available Help at Discharge: Family Type of Home: House Home Access: Stairs to enter [employer] of Steps: 2 Entrance Stairs-Rails: Right Home Layout: One level Home Adaptive Equipment: Walker - rolling Prior Function Level of Independence: Independent;Independent with assistive device(s) Able to Take Stairs?: Yes Vocation: Retired Musician: No difficulties Dominant Hand: Right    Cognition  Cognition Arousal/Alertness: Awake/alert Behavior During Therapy: WFL for tasks assessed/performed Overall Cognitive Status: Within Functional Limits for tasks assessed    Extremity/Trunk Assessment Right Upper Extremity Assessment RUE ROM/Strength/Tone: [facility] for tasks assessed Left Upper Extremity Assessment LUE ROM/Strength/Tone: WFL for tasks assessed Right Lower Extremity Assessment RLE ROM/Strength/Tone: [facility] for tasks assessed Left Lower Extremity Assessment LLE ROM/Strength/Tone: [facility] for tasks assessed   Balance  End of Session PT - End of Session Activity  Tolerance: Patient tolerated treatment well Patient left: in chair;with call bell/phone within reach;with family/visitor present Nurse Communication: Mobility status  GP     Leanor Voris 10/23/2012, 2:15 PM

## 2012-10-23 NOTE — Discharge Summary (Addendum)
Physician Discharge Summary  Shelly [name] QMV:[id number] DOB: 06/16/1919 DOA: 10/21/2012  PCP: Ron Parker, MD  Admit date: 10/21/2012 Discharge date: 10/23/2012  Time spent: >20mins  Recommendations for Outpatient Follow-up:  1. PCP Dr. Della Goo in 1 to 2 weeks, call for appointment upon discharge 2. [facility] - next week Discharge Diagnoses:  Principal Problem:   Syncope Active Problems:   History of pulmonary embolism   Rheumatoid arthritis   Hypertension   Fever   Discharge Condition: Improved/STABLE  Diet recommendation:  Heart healthy diet  Filed Weights   10/21/12 2015 10/21/12 2300  Weight: 74.844 kg (165 lb) 68.5 kg (151 lb 0.2 oz)    History of present illness:  Shelly [name] is a 79 y.o. female history of hypertension, PE on Coumadin, rheumatoid arthritis on prednisone was brought to the ER after patient had brief spell of loss of consciousness. As per patient's daughter who provided the history patient has been having nausea vomiting for last 2 days. Today after dinner when patient was walking back to the den she suddenly felt weak and passed out. Patient's daughter was standing beside her and held her before she had the floor. She lost consciousness for less than a minute and did not have any focal deficits or any seizure-like activities. When she was brought to the ER she was found mildly febrile. Chest x-ray and UA are unremarkable. CT head was negative for any acute. EKG shows sinus rhythm. Patient at this time has been admitted for further management. Patient denies any chest pain or shortness of breath. Denies any abdominal pain or diarrhea.   Hospital Course:  1.Orthostatic Syncope  - As discussed above, upon admission a CT scan of her head was done which came back negative for acute findings. -Cardiac enzymes were done and came back negative  - A. 2-D echo with EF of 65-70% no wall motion abnormalities, grade 1 diastolic dysfunction  noted(-no clinical evidence of fluid overload) -Patient had orthostatic vitals done and was found to orthostatic by pulse, and was hydrated-IV fluid boluses as well as continuous IVF. She has been able to ambulate without difficulty using walker and did not have any dizziness with ambulation. Her HCTZ was discontinued as well during this hospitalization. She is to followup with her PCP. -The impression was that she had orthostatic syncope.  2.Nausea and vomiting  -Possibly secondary to gastroenteritis, clinically improved  -She does have gallstones but has had no abdominal pain, and is nontender on abdominal exam  -Serum lipase is negative. She is tolerating by mouth's well, had no further nausea vomiting and will be discharged at this time followup with her PCP 3.Mild fever  - UA and chest x-ray are unremarkable.  -blood cultures to date show no growth -The mild fever could be from possible gastroenteritis.  -She has no evidence of cholecystitis on ultrasound.  -Patient has remained afebrile and hemodynamically stable with no leukocytosis. 4.Chronic steroids use secondary to rheumatoid arthritis  -Continue prednisone  5.Chronic kidney disease  - creatinine improved with hydration  6.History of atrial fibrillation  - presently in sinus rhythm.  -INR therapeutic, she is to continue her Coumadin and followup at [facility] card-coumadin clinic as directed 7.History of PE - on Coumadin per pharmacy.  8. Cholelithiasis  -without evidence of cholecystitis per ultrasound, and without right upper quadrant tenderness on exam or abdominal pain  -LFTs within normal limits      Procedures:  2-D echo  Study Conclusions  -  Left ventricle: The cavity size was normal. Wall thickness was increased increased in a pattern of mild to moderate LVH. Systolic function was vigorous. The estimated ejection fraction was in the range of 65% to 70%. Wall motion was normal; there were no regional wall  motion abnormalities. Doppler parameters are consistent with abnormal left ventricular relaxation (grade 1 diastolic dysfunction). - Mitral valve: Mildly calcified annulus. Mildly thickened, mildly calcified leaflets . - Atrial septum: There was an atrial septal aneurysm. - Pericardium, extracardiac: A small pericardial effusion was identified circumferential to the heart.   Consultations:  none  Discharge Exam: Filed Vitals:   10/23/12 0556 10/23/12 0932 10/23/12 0948 10/23/12 1004  BP: 131/76 142/65 109/71 140/112  Pulse: 73 82 116 118  Temp: 98.1 F (36.7 C) 98 F (36.7 C)    TempSrc: Oral Oral    Resp: 16 18 22 22   Height:      Weight:      SpO2: 99% 100% 100% 100%    Exam:  General: Elderly female, in no apparent distress  Cardiovascular: Regular rate and rhythm, S1-S2  Respiratory: Clear to auscultation bilaterally no crackles or wheezes  Abdomen: Soft, bowel sounds present nontender nondistended no organomegaly and no masses palpable  Extremities: No cyanosis and no edema   Discharge Instructions     Medication List    ASK your doctor about these medications       calcium-vitamin D 500-200 MG-UNIT per tablet  Commonly known as:  OSCAL WITH D  Take 1 tablet by mouth 2 (two) times daily.     diltiazem 60 MG tablet  Commonly known as:  CARDIZEM  Take 60 mg by mouth 3 (three) times daily.     HYDROcodone-acetaminophen 5-325 MG per tablet  Commonly known as:  NORCO/VICODIN  Take 1 tablet by mouth every 6 (six) hours as needed for pain.     mirtazapine 15 MG tablet  Commonly known as:  REMERON  Take 15 mg by mouth at bedtime.     predniSONE 5 MG tablet  Commonly known as:  DELTASONE  Take by mouth daily.     ranitidine 150 MG tablet  Commonly known as:  ZANTAC  Take 150 mg by mouth daily.     warfarin 4 MG tablet  Commonly known as:  COUMADIN  Take 4-6 mg by mouth daily. Takes 1 tablet(4mg ) daily except takes 1.5tablets(6mg ) on sundays and  tuesdays           Follow-up Information   Follow up with Ron Parker, MD. (in 1-2weeks, call for appt upon discharge)    Contact information:   [address] [place] [address] [phone]        The results of significant diagnostics from this hospitalization (including imaging, microbiology, ancillary and laboratory) are listed below for reference.    Significant Diagnostic Studies: Dg Chest 2 View  10/21/2012  *RADIOLOGY REPORT*  Clinical Data: Fever, headache, syncope, altered mental status  CHEST - 2 VIEW  Comparison: 07/28/2010  Findings: Low lung volumes with mild bibasilar atelectasis.  Increased interstitial markings/perihilar opacities, chronic.  No focal consolidation.  No pleural effusion or pneumothorax.  The heart is top normal in size.  Degenerative changes of the visualized thoracolumbar spine.  IMPRESSION: Low lung volumes with mild bibasilar atelectasis.  Underlying chronic interstitial markings.   Original Report Authenticated By: Charline Bills, M.D.    Ct Head Wo Contrast  10/21/2012  *RADIOLOGY REPORT*  Clinical Data: Confusion.  Diarrhea  and vomiting.  Headache.  CT HEAD WITHOUT CONTRAST  Technique:  Contiguous axial images were obtained from the base of the skull through the vertex without contrast.  Comparison: CT of the head performed 07/28/2010, and MRI of the brain performed 08/18/2010  Findings: There is no evidence of acute infarction, mass lesion, or intra- or extra-axial hemorrhage on CT.  Prominence of the ventricles and sulci reflects mild age- appropriate cortical volume loss.  Diffuse periventricular and subcortical white matter change likely reflects small vessel ischemic microangiopathy.  A small stable small focus of calcification is noted along the mid falx cerebri.  Calcification is noted within the basal ganglia bilaterally.  The brainstem and fourth ventricle are within normal limits.  The cerebral hemispheres demonstrate  grossly normal gray-white differentiation.  No mass effect or midline shift is seen.  There is no evidence of fracture; a vague lucency within the right posterior arch of C1 is stable from 2010 and likely benign.  The visualized portions of the orbits are within normal limits.  The paranasal sinuses and mastoid air cells are well-aerated.  No significant soft tissue abnormalities are seen.  IMPRESSION:  1.  No acute intracranial pathology seen on CT. 2.  Mild age-appropriate cortical volume loss and diffuse small vessel ischemic microangiopathy. 3.  Vague lucency within the right posterior arch of C1 is stable from 2010 and likely benign.   Original Report Authenticated By: Tonia Ghent, M.D.    US Abdomen Complete  10/22/2012  *RADIOLOGY REPORT*  Clinical Data:  Nausea, vomiting  COMPLETE ABDOMINAL ULTRASOUND  Comparison:  None.  Findings:  Gallbladder:  Multiple mobile echogenic shadowing foci within the gallbladder lung lumen consistent with cholelithiasis.  No gallbladder wall thickening, or pericholecystic fluid.  Per the sonographer, as sonographic Murphy's sign was negative.  Common bile duct:  The common bile duct is within normal limits at 4.6 mm in caliber.  Liver:  No focal lesion identified.  Within normal limits in parenchymal echogenicity.  IVC:  Appears normal.  Pancreas:  Partially obscured by overlying bowel gas.  The pancreatic duct is visualized and is minimally prominent at 3.3 mm.  Spleen:  Within normal limits at 7.9 cm in length.  No focal abnormality.  Right Kidney:  Circumscribed, anechoic simple cyst measures 2.4 x 2.0 x 2.0 cm exophytic from the lower pole.  No hydronephrosis. The kidney measures 10 cm in length.  The renal parenchyma may be minimally echogenic.  Left Kidney:  Normal reniform contour without hydronephrosis or solid renal lesion.  The renal parenchyma is minimally echogenic. Renal length is 10.3 cm.  Abdominal aorta:  Within normal limits.  No aneurysm identified.   IMPRESSION:  1.  Cholelithiasis without sonographic findings to suggest acute cholecystitis. 2.  Slightly echogenic renal parenchyma bilaterally as can be seen with underlying medical renal disease. 3.  Right renal cyst.   Original Report Authenticated By: Malachy Moan, M.D.     Microbiology: Recent Results (from the past 240 hour(s))  CULTURE, BLOOD (ROUTINE X 2)     Status: None   Collection Time    10/22/12  2:15 AM      Result Value Range Status   Specimen Description BLOOD LEFT HAND   Final   Special Requests BOTTLES DRAWN AEROBIC AND ANAEROBIC 3CC   Final   Culture  Setup Time 10/22/2012 10:37   Final   Culture     Final   Value:        BLOOD CULTURE RECEIVED NO  GROWTH TO DATE CULTURE WILL BE HELD FOR 5 DAYS BEFORE ISSUING A FINAL NEGATIVE REPORT   Report Status PENDING   Incomplete  CULTURE, BLOOD (ROUTINE X 2)     Status: None   Collection Time    10/22/12  2:18 AM      Result Value Range Status   Specimen Description BLOOD LEFT ARM   Final   Special Requests BOTTLES DRAWN AEROBIC ONLY 6CC   Final   Culture  Setup Time 10/22/2012 10:37   Final   Culture     Final   Value:        BLOOD CULTURE RECEIVED NO GROWTH TO DATE CULTURE WILL BE HELD FOR 5 DAYS BEFORE ISSUING A FINAL NEGATIVE REPORT   Report Status PENDING   Incomplete     Labs: Basic Metabolic Panel:  Recent Labs Lab 10/21/12 2018 10/22/12 0215 10/23/12 0422  NA 134* 130* 138  K 3.4* 3.4* 4.0  CL 98 95* 109  CO2 24 23 22   GLUCOSE 125* 94 88  BUN 19 16 17   CREATININE 1.52* 1.29* 1.36*  CALCIUM 9.3 8.6 8.4   Liver Function Tests:  Recent Labs Lab 10/21/12 2018  AST 20  ALT 8  ALKPHOS 40  BILITOT 0.7  PROT 7.5  ALBUMIN 3.3*    Recent Labs Lab 10/22/12 0215  LIPASE 38   No results found for this basename: AMMONIA,  in the last 168 hours CBC:  Recent Labs Lab 10/21/12 2018 10/22/12 0215  WBC 10.0 9.1  HGB 12.0 11.8*  HCT 35.0* 34.5*  MCV 70.4* 71.1*  PLT 285 236   Cardiac  Enzymes:  Recent Labs Lab 10/21/12 2018 10/22/12 0215  TROPONINI <0.30 <0.30   BNP: BNP (last 3 results) No results found for this basename: PROBNP,  in the last 8760 hours CBG: No results found for this basename: GLUCAP,  in the last 168 hours     Signed:  Jeanifer Halliday C  [facility] 10/23/2012, 2:40 PM

## 2012-10-23 NOTE — Progress Notes (Signed)
ANTICOAGULATION CONSULT NOTE - Follow up Consult  Pharmacy Consult for warfarin Indication: VTE treatment (Hx of afib and PE)  No Known Allergies  Patient Measurements: Height: 5\' 1"  (154.9 cm) Weight: 151 lb 0.2 oz (68.5 kg) IBW/kg (Calculated) : 47.8  Vital Signs: Temp: 98 F (36.7 C) (04/20 0932) Temp src: Oral (04/20 0932) BP: 140/112 mmHg (04/20 1004) Pulse Rate: 118 (04/20 1004)  Labs:  Recent Labs  10/21/12 2018 10/22/12 0215 10/22/12 0219 10/23/12 0422  HGB 12.0 11.8*  --   --   HCT 35.0* 34.5*  --   --   PLT 285 236  --   --   LABPROT 23.5*  --  24.8* 27.2*  INR 2.20*  --  2.37* 2.68*  CREATININE 1.52* 1.29*  --  1.36*  TROPONINI <0.30 <0.30  --   --     Estimated Creatinine Clearance: 23.9 ml/min (by C-G formula based on Cr of 1.36).   Medications:  Scheduled:  . diltiazem  60 mg Oral TID  . famotidine  20 mg Oral BID  . mirtazapine  15 mg Oral QHS  . [COMPLETED] potassium chloride  40 mEq Oral Once  . predniSONE  5 mg Oral Q breakfast  . [COMPLETED] sodium chloride  500 mL Intravenous Once  . sodium chloride  500 mL Intravenous Once  . sodium chloride  3 mL Intravenous Q12H  . warfarin  4 mg Oral Custom  . warfarin  6 mg Oral Custom  . Warfarin - Pharmacist Dosing Inpatient   Does not apply q1800    Assessment: 79 yo F admit 4/18 with syncope, N/V, and fever.  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  Pharmacy asked to dose warfarin.  Home warfarin dosing reported as: 4mg  on Mon/Wed/Thurs/Fri/Sat and 6mg  on Sun/Tues  INR (2.68) remains therapeutic on home warfarin dosing  No drug interactions noted.  CBC: most recent labs (4/19) show stable Hgb and Plt  Goal of Therapy:  INR 2-3   Plan:   Continue home warfarin dosing  Daily INR  Lynann Beaver PharmD, BCPS Pager [phone] 10/23/2012 12:05 PM

## 2012-10-28 LAB — CULTURE, BLOOD (ROUTINE X 2)
Culture: NO GROWTH
Culture: NO GROWTH

## 2013-02-27 LAB — PROTIME-INR

## 2013-04-02 ENCOUNTER — Encounter: Payer: Self-pay | Admitting: Interventional Cardiology

## 2013-04-11 ENCOUNTER — Ambulatory Visit (INDEPENDENT_AMBULATORY_CARE_PROVIDER_SITE_OTHER): Payer: Medicare Other | Admitting: Pharmacist

## 2013-04-11 ENCOUNTER — Encounter: Payer: Self-pay | Admitting: Interventional Cardiology

## 2013-04-11 ENCOUNTER — Ambulatory Visit (INDEPENDENT_AMBULATORY_CARE_PROVIDER_SITE_OTHER): Payer: Medicare Other | Admitting: Interventional Cardiology

## 2013-04-11 VITALS — BP 140/76 | HR 96 | Wt 147.0 lb

## 2013-04-11 DIAGNOSIS — Z86711 Personal history of pulmonary embolism: Secondary | ICD-10-CM

## 2013-04-11 DIAGNOSIS — R55 Syncope and collapse: Secondary | ICD-10-CM

## 2013-04-11 DIAGNOSIS — I2699 Other pulmonary embolism without acute cor pulmonale: Secondary | ICD-10-CM

## 2013-04-11 DIAGNOSIS — I10 Essential (primary) hypertension: Secondary | ICD-10-CM

## 2013-04-11 LAB — POCT INR: INR: 3.9

## 2013-04-11 NOTE — Patient Instructions (Signed)
Your physician wants you to follow-up in: 1 year with Dr. Varanasi. You will receive a reminder letter in the mail two months in advance. If you don't receive a letter, please call our office to schedule the follow-up appointment.  Your physician recommends that you continue on your current medications as directed. Please refer to the Current Medication list given to you today.  

## 2013-04-11 NOTE — Progress Notes (Signed)
Patient ID: Shelly [name], female   DOB: 07-26-1918, 79 y.o.   MRN: [id number]    [address] [place], [place]  [address] Phone: [phone] Fax:  [phone]  Date:  04/11/2013   ID:  Shelly [name], DOB [birth year], MRN [id number]  PCP:  Ron Parker, MD      History of Present Illness: Shelly [name] is a 79 y.o. female who has had PE and will be on lifelong COumadin. She has done well. No bleeding problems. No chest pain or SHOB. she does some walking during the day. Syncope in 4/14.  EF 65-70%    Wt Readings from Last 3 Encounters:  04/11/13 147 lb (66.679 kg)  10/21/12 151 lb 0.2 oz (68.5 kg)     Past Medical History  Diagnosis Date  . Arthritis   . Hypertension   . H/O blood clots   . Bronchitis     Current Outpatient Prescriptions  Medication Sig Dispense Refill  . Calcium Carbonate-Vitamin D (CALCIUM 600+D) 600-400 MG-UNIT per tablet Take 1 tablet by mouth 2 (two) times daily.      Marland Kitchen dexlansoprazole (DEXILANT) 60 MG capsule Take 60 mg by mouth daily.      Marland Kitchen diltiazem (CARDIZEM) 60 MG tablet Take 60 mg by mouth 3 (three) times daily.      Marland Kitchen HYDROcodone-acetaminophen (NORCO/VICODIN) 5-325 MG per tablet Take 1 tablet by mouth every 6 (six) hours as needed for pain.      . mirtazapine (REMERON) 15 MG tablet Take 15 mg by mouth at bedtime.      . predniSONE (DELTASONE) 5 MG tablet Take by mouth daily.      . ranitidine (ZANTAC) 150 MG tablet Take 150 mg by mouth daily.      Marland Kitchen warfarin (COUMADIN) 4 MG tablet Take 4-6 mg by mouth daily. Takes 1 tablet(4mg ) daily except takes 1.5tablets(6mg ) on sundays and tuesdays       No current facility-administered medications for this visit.    Allergies:   No Known Allergies  Social History:  The patient  reports that she has never smoked. She does not have any smokeless tobacco history on file. She reports that she does not drink alcohol or use illicit drugs.   Family History:  The patient's family history  includes Hypertension in her daughter.   ROS:  Please see the history of present illness.  No nausea, vomiting.  No fevers, chills.  No focal weakness.  No dysuria. Weight loss.   All other systems reviewed and negative.   PHYSICAL EXAM: VS:  BP 140/76  Pulse 96  Wt 147 lb (66.679 kg)  BMI 27.79 kg/m2 Well nourished, well developed, in no acute distress HEENT: normal Neck: no JVD, no carotid bruits Cardiac:  normal S1, S2; RRR; premature beats Lungs:  clear to auscultation bilaterally, no wheezing, rhonchi or rales Abd: soft, nontender, no hepatomegaly Ext: no edema Skin: warm and dry Neuro:   no focal abnormalities noted  EKG:  NSR, PACs     ASSESSMENT AND PLAN:  Pulmonary embolism  she requires lifelong anticoagulation.  2. Anticoagulant monitoring  INR to be checked and coumadin dose to be adjusted by PharmD. Please see his note for details.   Syncope- SVT noted in the hospital.  [facility] has been diltiazem for several years.  Diuretic was stopped.  Normal LV function.  No aortic stenosis by echo.     Signed, Fredric Mare, MD, [facility] 04/11/2013 2:11 PM

## 2013-04-24 ENCOUNTER — Ambulatory Visit (INDEPENDENT_AMBULATORY_CARE_PROVIDER_SITE_OTHER): Payer: Medicare Other | Admitting: Pharmacist

## 2013-04-24 DIAGNOSIS — I2699 Other pulmonary embolism without acute cor pulmonale: Secondary | ICD-10-CM

## 2013-04-24 LAB — POCT INR: INR: 2.8

## 2013-05-02 ENCOUNTER — Ambulatory Visit (INDEPENDENT_AMBULATORY_CARE_PROVIDER_SITE_OTHER): Payer: Medicare Other | Admitting: Pharmacist

## 2013-05-02 DIAGNOSIS — I2699 Other pulmonary embolism without acute cor pulmonale: Secondary | ICD-10-CM

## 2013-05-02 LAB — POCT INR: INR: 2

## 2013-05-09 ENCOUNTER — Encounter: Payer: Self-pay | Admitting: Neurology

## 2013-05-10 ENCOUNTER — Ambulatory Visit (INDEPENDENT_AMBULATORY_CARE_PROVIDER_SITE_OTHER): Payer: Medicare Other | Admitting: Neurology

## 2013-05-10 ENCOUNTER — Encounter: Payer: Self-pay | Admitting: Neurology

## 2013-05-10 VITALS — BP 125/70 | HR 53 | Ht 61.5 in | Wt 147.0 lb

## 2013-05-10 DIAGNOSIS — F09 Unspecified mental disorder due to known physiological condition: Secondary | ICD-10-CM

## 2013-05-10 DIAGNOSIS — E538 Deficiency of other specified B group vitamins: Secondary | ICD-10-CM

## 2013-05-10 DIAGNOSIS — E039 Hypothyroidism, unspecified: Secondary | ICD-10-CM

## 2013-05-10 DIAGNOSIS — R4189 Other symptoms and signs involving cognitive functions and awareness: Secondary | ICD-10-CM

## 2013-05-10 NOTE — Patient Instructions (Signed)
Overall you are doing fairly well but I do want to suggest a few things today:   Remember to drink plenty of fluid, eat healthy meals and do not skip any meals. Try to eat protein with a every meal and eat a healthy snack such as fruit or nuts in between meals. Try to keep a regular sleep-wake schedule and try to exercise daily, particularly in the form of walking, 20-30 minutes a day, if you can.   As far as your medications are concerned, I would like to suggest  As far as diagnostic testing:  1)Brain MRI 2)Lab workup for causes of peripheral neuropathy  I would like to see you back in 6 months, sooner if we need to. Please call us with any interim questions, concerns, problems, updates or refill requests.   Please also call us for any test results so we can go over those with you on the phone.  My clinical assistant and will answer any of your questions and relay your messages to me and also relay most of my messages to you.   Our phone number is [phone]. We also have an after hours call service for urgent matters and there is a physician on-call for urgent questions. For any emergencies you know to call 911 or go to the nearest emergency room

## 2013-05-10 NOTE — Progress Notes (Signed)
[facility]    Provider:  Dr Hosie Poisson Referring Provider: Cain Saupe, MD Primary Care Physician:  Cain Saupe, MD  CC:  Cognitive decline  HPI:  Shelly [name] is a 79 y.o. female here as a referral from Dr. Jillyn Hidden for evaluation of cognitive decline. Daughter thinks memory has been good up until around 6 months ago.Quickly getting worse over that time. Notes trouble with remembering day to day things. Trouble with short term memory. Lives at home with her husband. Daughter manages the finances for the past 10 years. Has not gotten lost. Not currently driving. Has good remote memory. Has had a fall in September, hit her head. No prior strokes or TIAs. Overall healthy. Is on coumadin for history of DVT and PE.   Review of Systems: Out of a complete 14 system review, the patient complains of only the following symptoms, and all other reviewed systems are negative. Positive for weight loss blurred vision easy bleeding memory loss confusion joint pain incontinence  History   Social History  . Marital Status: Married    Spouse Name: Tasia Catchings    Number of Children: 4  . Years of Education: 12+   Occupational History  .     Social History Main Topics  . Smoking status: Never Smoker   . Smokeless tobacco: Never Used  . Alcohol Use: No  . Drug Use: No  . Sexual Activity: Not on file   Other Topics Concern  . Not on file   Social History Narrative   Patient lives at home with her husband Tasia Catchings).   Patient has four children.   Patient has a high school and trade school education.   Patient is retired.   Patient does drink caffeine- one cup of coffee daily.   Patient is right-handed.    Family History  Problem Relation Age of Onset  . Hypertension Daughter     Past Medical History  Diagnosis Date  . Arthritis   . Hypertension   . H/O blood clots   . Bronchitis   . Chronic anemia   . GERD (gastroesophageal reflux disease)   . Diabetes mellitus   .  Renal failure     Stage II, creatine clearance 40-50 cc /minute    Past Surgical History  Procedure Laterality Date  . Tubaligation    . Replacement total knee bilateral      Current Outpatient Prescriptions  Medication Sig Dispense Refill  . Calcium Carbonate-Vitamin D (CALCIUM 600+D) 600-400 MG-UNIT per tablet Take 1 tablet by mouth 2 (two) times daily.      . ciprofloxacin (CIPRO) 250 MG tablet Take 250 mg by mouth 2 (two) times daily.      Marland Kitchen diltiazem (CARDIZEM) 60 MG tablet Take 60 mg by mouth 3 (three) times daily.      Marland Kitchen HYDROcodone-acetaminophen (NORCO/VICODIN) 5-325 MG per tablet Take 1 tablet by mouth every 6 (six) hours as needed for pain.      . mirtazapine (REMERON) 15 MG tablet Take 15 mg by mouth at bedtime.      . pantoprazole (PROTONIX) 40 MG tablet Take 40 mg by mouth daily.      . predniSONE (DELTASONE) 5 MG tablet Take by mouth daily.      . ranitidine (ZANTAC) 150 MG tablet Take 150 mg by mouth daily.      Marland Kitchen warfarin (COUMADIN) 4 MG tablet Take 4 mg by mouth daily.        No current facility-administered medications  for this visit.    Allergies as of 05/10/2013  . (No Known Allergies)    Vitals: BP 125/70  Pulse 53  Ht 5' 1.5" (1.562 m)  Wt 147 lb (66.679 kg)  BMI 27.33 kg/m2 Last Weight:  Wt Readings from Last 1 Encounters:  05/10/13 147 lb (66.679 kg)   Last Height:   Ht Readings from Last 1 Encounters:  05/10/13 5' 1.5" (1.562 m)     Physical exam: Exam: Gen: NAD, conversant Eyes: anicteric sclerae, moist conjunctivae HENT: Atraumatic, oropharynx clear Neck: Trachea midline; supple,  Lungs: CTA, no wheezing, rales, rhonic                          CV: RRR, no MRG Abdomen: Soft, non-tender;  Extremities: No peripheral edema  Skin: Normal temperature, no rash,  Psych: Appropriate affect, pleasant  Neuro: MS: AA&Ox3, appropriately interactive, normal affect   MOCA 12/30  CN: PERRL, EOMI no nystagmus, no ptosis, sensation intact to LT  V1-V3 bilat, face symmetric, no weakness, hearing grossly intact, palate elevates symmetrically, shoulder shrug 5/5 bilat,  tongue protrudes midline, no fasiculations noted.  Motor: normal bulk and tone Strength: 5/5  In all extremities  Coord: rapid alternating and point-to-point (FNF, HTS) movements intact.  Reflexes: symmetrical, bilat downgoing toes  Sens: LT intact in all extremities  Gait: Stands slowly without assistance, uses walker, mildly wide-base multiple steps to turn    Assessment:  After physical and neurologic examination, review of laboratory studies, imaging, neurophysiology testing and pre-existing records, assessment will be reviewed on the problem list.  Plan:  Treatment plan and additional workup will be reviewed under Problem List.  1)Cognitive decline  79 year old woman presenting for initial evaluation of cognitive decline. Presents with her daughter present majority of the history. Her daughter expresses concern that the cognitive decline has been most notable over the past 6 months. Patient is on Coumadin for history of PE and DVT. This raises the risk of possible subdural or ICH. Will check brain imaging. Check lab work peripheral causes of dementia. His pulses are unremarkable will start patient on Aricept 5 mg daily.

## 2013-05-12 LAB — METHYLMALONIC ACID, SERUM: Methylmalonic Acid: 204 nmol/L (ref 0–378)

## 2013-05-12 LAB — VITAMIN B1, WHOLE BLOOD: Thiamine: 137.8 nmol/L (ref 66.5–200.0)

## 2013-05-12 LAB — TSH: TSH: 1.31 u[IU]/mL (ref 0.450–4.500)

## 2013-05-12 LAB — VITAMIN B12: Vitamin B-12: 674 pg/mL (ref 211–946)

## 2013-05-15 ENCOUNTER — Ambulatory Visit (INDEPENDENT_AMBULATORY_CARE_PROVIDER_SITE_OTHER): Payer: Medicare Other | Admitting: Pharmacist

## 2013-05-15 DIAGNOSIS — I2699 Other pulmonary embolism without acute cor pulmonale: Secondary | ICD-10-CM

## 2013-05-15 LAB — POCT INR: INR: 2.3

## 2013-05-15 NOTE — Progress Notes (Signed)
Quick Note:  Left message for daughter that labs are normal, per Dr. Hosie Poisson. Told to call with further questions. ______

## 2013-05-25 ENCOUNTER — Ambulatory Visit
Admission: RE | Admit: 2013-05-25 | Discharge: 2013-05-25 | Disposition: A | Discharge status: Home / Self Care | Payer: Medicare Other | Source: Ambulatory Visit | Attending: Neurology | Admitting: Neurology

## 2013-05-25 DIAGNOSIS — R413 Other amnesia: Secondary | ICD-10-CM

## 2013-05-25 DIAGNOSIS — R4189 Other symptoms and signs involving cognitive functions and awareness: Secondary | ICD-10-CM

## 2013-05-29 ENCOUNTER — Telehealth: Payer: Self-pay | Admitting: Neurology

## 2013-05-29 MED ORDER — DONEPEZIL HCL 5 MG PO TABS: 5.0000 mg | ORAL_TABLET | Freq: Every day | ORAL | Status: DC

## 2013-05-29 NOTE — Telephone Encounter (Signed)
Called to discuss MRI results with patients daughter. Will start Aricept 5mg  nightly. Prescription sent to pharmacy.

## 2013-06-12 ENCOUNTER — Ambulatory Visit (INDEPENDENT_AMBULATORY_CARE_PROVIDER_SITE_OTHER): Payer: Medicare Other | Admitting: Pharmacist

## 2013-06-12 ENCOUNTER — Encounter (INDEPENDENT_AMBULATORY_CARE_PROVIDER_SITE_OTHER): Payer: Self-pay

## 2013-06-12 DIAGNOSIS — I2699 Other pulmonary embolism without acute cor pulmonale: Secondary | ICD-10-CM

## 2013-06-12 LAB — POCT INR: INR: 2.8

## 2013-07-14 ENCOUNTER — Ambulatory Visit (INDEPENDENT_AMBULATORY_CARE_PROVIDER_SITE_OTHER): Payer: Medicare Other | Admitting: Pharmacist

## 2013-07-14 DIAGNOSIS — I2699 Other pulmonary embolism without acute cor pulmonale: Secondary | ICD-10-CM

## 2013-07-14 LAB — POCT INR: INR: 3.8

## 2013-07-28 ENCOUNTER — Ambulatory Visit (INDEPENDENT_AMBULATORY_CARE_PROVIDER_SITE_OTHER): Payer: Medicare Other | Admitting: Pharmacist

## 2013-07-28 DIAGNOSIS — I2699 Other pulmonary embolism without acute cor pulmonale: Secondary | ICD-10-CM

## 2013-07-28 LAB — POCT INR: INR: 2.6

## 2013-09-05 ENCOUNTER — Ambulatory Visit (INDEPENDENT_AMBULATORY_CARE_PROVIDER_SITE_OTHER): Payer: Medicare Other | Admitting: Pharmacist

## 2013-09-05 DIAGNOSIS — I2699 Other pulmonary embolism without acute cor pulmonale: Secondary | ICD-10-CM

## 2013-09-05 LAB — POCT INR: INR: 2.9

## 2013-09-26 ENCOUNTER — Other Ambulatory Visit: Payer: Self-pay | Admitting: Neurology

## 2013-10-03 ENCOUNTER — Ambulatory Visit (INDEPENDENT_AMBULATORY_CARE_PROVIDER_SITE_OTHER): Payer: Medicare Other | Admitting: Pharmacist Clinician (PhC)/ Clinical Pharmacy Specialist

## 2013-10-03 DIAGNOSIS — I2699 Other pulmonary embolism without acute cor pulmonale: Secondary | ICD-10-CM

## 2013-10-03 LAB — POCT INR: INR: 3.9

## 2013-10-17 ENCOUNTER — Ambulatory Visit (INDEPENDENT_AMBULATORY_CARE_PROVIDER_SITE_OTHER): Payer: Medicare Other

## 2013-10-17 DIAGNOSIS — I2699 Other pulmonary embolism without acute cor pulmonale: Secondary | ICD-10-CM

## 2013-10-17 LAB — POCT INR: INR: 1.9

## 2013-11-07 ENCOUNTER — Ambulatory Visit (INDEPENDENT_AMBULATORY_CARE_PROVIDER_SITE_OTHER): Payer: Medicare Other

## 2013-11-07 DIAGNOSIS — I2699 Other pulmonary embolism without acute cor pulmonale: Secondary | ICD-10-CM

## 2013-11-07 LAB — POCT INR: INR: 3.1

## 2013-11-09 ENCOUNTER — Ambulatory Visit: Payer: Medicare Other | Admitting: Neurology

## 2013-11-13 ENCOUNTER — Encounter (HOSPITAL_COMMUNITY): Payer: Self-pay | Admitting: Emergency Medicine

## 2013-11-13 ENCOUNTER — Inpatient Hospital Stay (HOSPITAL_COMMUNITY)
Admission: EM | Admit: 2013-11-13 | Discharge: 2013-11-16 | DRG: 065 | Disposition: A | Discharge status: Rehabilitation Facility | Payer: Medicare Other | Attending: Neurology | Admitting: Neurology

## 2013-11-13 ENCOUNTER — Emergency Department (HOSPITAL_COMMUNITY): Payer: Medicare Other

## 2013-11-13 DIAGNOSIS — K219 Gastro-esophageal reflux disease without esophagitis: Secondary | ICD-10-CM | POA: Diagnosis present

## 2013-11-13 DIAGNOSIS — W19XXXA Unspecified fall, initial encounter: Secondary | ICD-10-CM | POA: Diagnosis present

## 2013-11-13 DIAGNOSIS — Z86711 Personal history of pulmonary embolism: Secondary | ICD-10-CM

## 2013-11-13 DIAGNOSIS — I82509 Chronic embolism and thrombosis of unspecified deep veins of unspecified lower extremity: Secondary | ICD-10-CM | POA: Diagnosis present

## 2013-11-13 DIAGNOSIS — I129 Hypertensive chronic kidney disease with stage 1 through stage 4 chronic kidney disease, or unspecified chronic kidney disease: Secondary | ICD-10-CM | POA: Diagnosis present

## 2013-11-13 DIAGNOSIS — Z8614 Personal history of Methicillin resistant Staphylococcus aureus infection: Secondary | ICD-10-CM

## 2013-11-13 DIAGNOSIS — IMO0002 Reserved for concepts with insufficient information to code with codable children: Secondary | ICD-10-CM

## 2013-11-13 DIAGNOSIS — Z8673 Personal history of transient ischemic attack (TIA), and cerebral infarction without residual deficits: Secondary | ICD-10-CM

## 2013-11-13 DIAGNOSIS — I629 Nontraumatic intracranial hemorrhage, unspecified: Secondary | ICD-10-CM

## 2013-11-13 DIAGNOSIS — F0390 Unspecified dementia without behavioral disturbance: Secondary | ICD-10-CM | POA: Diagnosis present

## 2013-11-13 DIAGNOSIS — I619 Nontraumatic intracerebral hemorrhage, unspecified: Principal | ICD-10-CM | POA: Diagnosis present

## 2013-11-13 DIAGNOSIS — E119 Type 2 diabetes mellitus without complications: Secondary | ICD-10-CM | POA: Diagnosis present

## 2013-11-13 DIAGNOSIS — M069 Rheumatoid arthritis, unspecified: Secondary | ICD-10-CM | POA: Diagnosis present

## 2013-11-13 DIAGNOSIS — N182 Chronic kidney disease, stage 2 (mild): Secondary | ICD-10-CM | POA: Diagnosis present

## 2013-11-13 DIAGNOSIS — R791 Abnormal coagulation profile: Secondary | ICD-10-CM | POA: Diagnosis present

## 2013-11-13 DIAGNOSIS — Z7901 Long term (current) use of anticoagulants: Secondary | ICD-10-CM

## 2013-11-13 DIAGNOSIS — T45515A Adverse effect of anticoagulants, initial encounter: Secondary | ICD-10-CM | POA: Diagnosis present

## 2013-11-13 LAB — COMPREHENSIVE METABOLIC PANEL
ALT: 11 U/L (ref 0–35)
AST: 26 U/L (ref 0–37)
Albumin: 3.4 g/dL — ABNORMAL LOW (ref 3.5–5.2)
Alkaline Phosphatase: 42 U/L (ref 39–117)
BUN: 16 mg/dL (ref 6–23)
CO2: 25 mEq/L (ref 19–32)
Calcium: 9.4 mg/dL (ref 8.4–10.5)
Chloride: 97 mEq/L (ref 96–112)
Creatinine, Ser: 1.46 mg/dL — ABNORMAL HIGH (ref 0.50–1.10)
GFR calc Af Amer: 35 mL/min — ABNORMAL LOW (ref >90)
GFR calc non Af Amer: 30 mL/min — ABNORMAL LOW (ref >90)
Glucose, Bld: 86 mg/dL (ref 70–99)
Potassium: 3.8 mEq/L (ref 3.7–5.3)
Sodium: 136 mEq/L — ABNORMAL LOW (ref 137–147)
Total Bilirubin: 1 mg/dL (ref 0.3–1.2)
Total Protein: 8.6 g/dL — ABNORMAL HIGH (ref 6.0–8.3)

## 2013-11-13 LAB — I-STAT TROPONIN, ED: Troponin i, poc: 0.08 ng/mL (ref 0.00–0.08)

## 2013-11-13 LAB — PROTIME-INR
INR: 1.75 — ABNORMAL HIGH (ref 0.00–1.49)
INR: 1.13 (ref 0.00–1.49)
INR: 1.06 (ref 0.00–1.49)
Prothrombin Time: 19.9 seconds — ABNORMAL HIGH (ref 11.6–15.2)
Prothrombin Time: 14.3 seconds (ref 11.6–15.2)
Prothrombin Time: 13.6 seconds (ref 11.6–15.2)

## 2013-11-13 LAB — CBC WITH DIFFERENTIAL/PLATELET
Basophils Absolute: 0 10*3/uL (ref 0.0–0.1)
Basophils Relative: 0 % (ref 0–1)
Eosinophils Absolute: 0.1 10*3/uL (ref 0.0–0.7)
Eosinophils Relative: 1 % (ref 0–5)
HCT: 35.8 % — ABNORMAL LOW (ref 36.0–46.0)
Hemoglobin: 12.1 g/dL (ref 12.0–15.0)
Lymphocytes Relative: 26 % (ref 12–46)
Lymphs Abs: 2.7 10*3/uL (ref 0.7–4.0)
MCH: 23.4 pg — ABNORMAL LOW (ref 26.0–34.0)
MCHC: 33.8 g/dL (ref 30.0–36.0)
MCV: 69.4 fL — ABNORMAL LOW (ref 78.0–100.0)
Monocytes Absolute: 1.2 10*3/uL — ABNORMAL HIGH (ref 0.1–1.0)
Monocytes Relative: 11 % (ref 3–12)
Neutro Abs: 6.5 10*3/uL (ref 1.7–7.7)
Neutrophils Relative %: 62 % (ref 43–77)
Platelets: 260 10*3/uL (ref 150–400)
RBC: 5.16 MIL/uL — ABNORMAL HIGH (ref 3.87–5.11)
RDW: 15.8 % — ABNORMAL HIGH (ref 11.5–15.5)
WBC: 10.5 10*3/uL (ref 4.0–10.5)

## 2013-11-13 LAB — URINALYSIS, ROUTINE W REFLEX MICROSCOPIC
Bilirubin Urine: NEGATIVE
Glucose, UA: NEGATIVE mg/dL
Ketones, ur: NEGATIVE mg/dL
Leukocytes, UA: NEGATIVE
Nitrite: NEGATIVE
Protein, ur: 30 mg/dL — AB
Specific Gravity, Urine: 1.012 (ref 1.005–1.030)
Urobilinogen, UA: 1 mg/dL (ref 0.0–1.0)
pH: 7.5 (ref 5.0–8.0)

## 2013-11-13 LAB — CBG MONITORING, ED: Glucose-Capillary: 86 mg/dL (ref 70–99)

## 2013-11-13 LAB — MRSA PCR SCREENING: MRSA by PCR: POSITIVE — AB

## 2013-11-13 LAB — URINE MICROSCOPIC-ADD ON

## 2013-11-13 LAB — GLUCOSE, CAPILLARY
Glucose-Capillary: 78 mg/dL (ref 70–99)
Glucose-Capillary: 78 mg/dL (ref 70–99)

## 2013-11-13 LAB — APTT: aPTT: 31 seconds (ref 24–37)

## 2013-11-13 LAB — I-STAT CG4 LACTIC ACID, ED: Lactic Acid, Venous: 1.28 mmol/L (ref 0.5–2.2)

## 2013-11-13 MED ORDER — SENNOSIDES-DOCUSATE SODIUM 8.6-50 MG PO TABS
1.0000 | ORAL_TABLET | Freq: Two times a day (BID) | ORAL | Status: DC
  Administered 2013-11-13 – 2013-11-16 (×5): 1 via ORAL
  Filled 2013-11-13 (×8): qty 1

## 2013-11-13 MED ORDER — ACETAMINOPHEN 325 MG PO TABS: 650.0000 mg | ORAL_TABLET | ORAL | Status: DC | PRN

## 2013-11-13 MED ORDER — VITAMIN K1 10 MG/ML IJ SOLN
10.0000 mg | INTRAVENOUS | Status: AC
  Administered 2013-11-13: 10 mg via INTRAVENOUS
  Filled 2013-11-13: qty 1

## 2013-11-13 MED ORDER — PANTOPRAZOLE SODIUM 40 MG PO TBEC
40.0000 mg | DELAYED_RELEASE_TABLET | Freq: Every day | ORAL | Status: DC
  Administered 2013-11-14 – 2013-11-16 (×3): 40 mg via ORAL
  Filled 2013-11-13 (×4): qty 1

## 2013-11-13 MED ORDER — VITAMIN K1 10 MG/ML IJ SOLN
5.0000 mg | Freq: Once | INTRAMUSCULAR | Status: AC
  Administered 2013-11-13: 5 mg via SUBCUTANEOUS
  Filled 2013-11-13: qty 0.5

## 2013-11-13 MED ORDER — ACETAMINOPHEN 650 MG RE SUPP
650.0000 mg | Freq: Once | RECTAL | Status: AC
  Administered 2013-11-13: 650 mg via RECTAL
  Filled 2013-11-13: qty 1

## 2013-11-13 MED ORDER — DILTIAZEM HCL 60 MG PO TABS
60.0000 mg | ORAL_TABLET | Freq: Three times a day (TID) | ORAL | Status: DC
  Administered 2013-11-13 – 2013-11-16 (×8): 60 mg via ORAL
  Filled 2013-11-13 (×13): qty 1

## 2013-11-13 MED ORDER — CHLORHEXIDINE GLUCONATE CLOTH 2 % EX PADS
6.0000 | MEDICATED_PAD | Freq: Every day | CUTANEOUS | Status: DC
  Administered 2013-11-14 – 2013-11-16 (×3): 6 via TOPICAL

## 2013-11-13 MED ORDER — HYDROCODONE-ACETAMINOPHEN 5-325 MG PO TABS: 1.0000 | ORAL_TABLET | Freq: Four times a day (QID) | ORAL | Status: DC | PRN

## 2013-11-13 MED ORDER — ACETAMINOPHEN 650 MG RE SUPP: 650.0000 mg | RECTAL | Status: DC | PRN

## 2013-11-13 MED ORDER — PROTHROMBIN COMPLEX CONC HUMAN 500 UNITS IV KIT
1581.0000 [IU] | PACK | INTRAVENOUS | Status: AC
  Administered 2013-11-13: 1581 [IU] via INTRAVENOUS
  Filled 2013-11-13: qty 63

## 2013-11-13 MED ORDER — MUPIROCIN 2 % EX OINT
1.0000 | TOPICAL_OINTMENT | Freq: Two times a day (BID) | CUTANEOUS | Status: DC
Start: 2013-11-13 — End: 2013-11-16
  Administered 2013-11-13 – 2013-11-16 (×5): 1 via NASAL
  Filled 2013-11-13 (×3): qty 22

## 2013-11-13 MED ORDER — ACETAMINOPHEN 325 MG PO TABS: 650.0000 mg | ORAL_TABLET | Freq: Once | ORAL | Status: DC

## 2013-11-13 MED ORDER — SODIUM CHLORIDE 0.9 % IV SOLN
INTRAVENOUS | Status: DC
Start: 2013-11-13 — End: 2013-11-16
  Administered 2013-11-14 – 2013-11-16 (×3): via INTRAVENOUS

## 2013-11-13 MED ORDER — LABETALOL HCL 5 MG/ML IV SOLN
10.0000 mg | INTRAVENOUS | Status: DC | PRN
Start: 2013-11-13 — End: 2013-11-16

## 2013-11-13 MED ORDER — MIRTAZAPINE 15 MG PO TABS
15.0000 mg | ORAL_TABLET | Freq: Every day | ORAL | Status: DC
  Administered 2013-11-13 – 2013-11-14 (×2): 15 mg via ORAL
  Filled 2013-11-13 (×6): qty 1

## 2013-11-13 MED ORDER — PREDNISONE 5 MG PO TABS
5.0000 mg | ORAL_TABLET | Freq: Every day | ORAL | Status: DC
  Administered 2013-11-14 – 2013-11-16 (×3): 5 mg via ORAL
  Filled 2013-11-13 (×5): qty 1

## 2013-11-13 NOTE — ED Notes (Signed)
Unable to complete swallow screen--patient unable to follow commands due to AMS PA made aware

## 2013-11-13 NOTE — ED Notes (Signed)
Dr. Rhunette Croft at bedside to inform patient's daughter of testing results

## 2013-11-13 NOTE — ED Notes (Signed)
Bed: EH21 Expected date:  Expected time:  Means of arrival:  Comments: EMS/catatonic

## 2013-11-13 NOTE — ED Provider Notes (Signed)
CSN: [id number]     Arrival date & time 11/13/13  4098 History   First MD Initiated Contact with Patient 11/13/13 [phone]     Chief Complaint  Patient presents with  . Altered Mental Status     (Consider location/radiation/quality/duration/timing/severity/associated sxs/prior Treatment) Patient is a 80 y.o. female presenting with altered mental status. The history is provided by a relative and medical records. No language interpreter was used.  Altered Mental Status   Shelly [name] is a 80 y.o. female  with a hx of HTN, NIDDM, a-fib presents to the Emergency Department complaining via EMS for altered mental status. Patient unable to answer questions. Patient's daughter at bedside reports that patient at baseline walks with a walker, dresses herself, feed herself and is alert and oriented. She reports that Saturday evening she found her mother face down on the floor in her bedroom without her walker. She assisted her mother to the standing position and patient appeared fine for the rest of the evening. Sunday morning mother appeared altered and remained altered throughout the day on Sunday.  Daughter reports decreased by mouth intake and generalized weakness throughout the day.  Daughter reports she placed depends on the patient which was full of urine this morning but did not have a foul odor. She reports patient felt "hot" to the touch but did not measure her fever at home.  She reports patient has been taking medications as directed until yesterday. This included her warfarin.  Level V caveat for altered metal status.    Past Medical History  Diagnosis Date  . Arthritis   . Hypertension   . H/O blood clots   . Bronchitis   . Chronic anemia   . GERD (gastroesophageal reflux disease)   . Diabetes mellitus   . Renal failure     Stage II, creatine clearance 40-50 cc /minute   Past Surgical History  Procedure Laterality Date  . Tubaligation    . Replacement total knee bilateral     Family  History  Problem Relation Age of Onset  . Hypertension Daughter    History  Substance Use Topics  . Smoking status: Never Smoker   . Smokeless tobacco: Never Used  . Alcohol Use: No   OB History   Grav Para Term Preterm Abortions TAB SAB Ect Mult Living                 Review of Systems  Unable to perform ROS     Allergies  Review of patient's allergies indicates no known allergies.  Home Medications   Prior to Admission medications   Medication Sig Start Date End Date Taking? Authorizing Provider  Calcium Carbonate-Vitamin D (CALCIUM 600+D) 600-400 MG-UNIT per tablet Take 1 tablet by mouth 2 (two) times daily.    Historical Provider, MD  ciprofloxacin (CIPRO) 250 MG tablet Take 250 mg by mouth 2 (two) times daily.    Historical Provider, MD  diltiazem (CARDIZEM) 60 MG tablet Take 60 mg by mouth 3 (three) times daily.    Historical Provider, MD  donepezil (ARICEPT) 5 MG tablet TAKE 1 TABLET BY MOUTH AT BEDTIME 09/26/13   [name], DO  HYDROcodone-acetaminophen (NORCO/VICODIN) 5-325 MG per tablet Take 1 tablet by mouth every 6 (six) hours as needed for pain.    Historical Provider, MD  mirtazapine (REMERON) 15 MG tablet Take 15 mg by mouth at bedtime.    Historical Provider, MD  pantoprazole (PROTONIX) 40 MG tablet Take 40 mg by mouth  daily.    Historical Provider, MD  predniSONE (DELTASONE) 5 MG tablet Take by mouth daily.    Historical Provider, MD  ranitidine (ZANTAC) 150 MG tablet Take 150 mg by mouth daily.    Historical Provider, MD  warfarin (COUMADIN) 4 MG tablet Take 4 mg by mouth daily.     Historical Provider, MD   BP 157/50  Pulse 59  Temp(Src) 101.9 F (38.8 C) (Rectal)  Resp 23 Physical Exam  Nursing note and vitals reviewed. Constitutional: She is oriented to person, place, and time. She appears well-developed and well-nourished. No distress.  Awake, alert, does not follow commands  HENT:  Head: Normocephalic and atraumatic.  Right Ear:  Tympanic membrane, external ear and ear canal normal.  Left Ear: Tympanic membrane, external ear and ear canal normal.  Nose: Nose normal. No epistaxis. Right sinus exhibits no maxillary sinus tenderness and no frontal sinus tenderness. Left sinus exhibits no maxillary sinus tenderness and no frontal sinus tenderness.  Mouth/Throat: Uvula is midline, oropharynx is clear and moist and mucous membranes are normal. Mucous membranes are not pale and not cyanotic. No oropharyngeal exudate, posterior oropharyngeal edema, posterior oropharyngeal erythema or tonsillar abscesses.  Eyes: Conjunctivae are normal. Pupils are equal, round, and reactive to light. No scleral icterus.  Neck: Normal range of motion and full passive range of motion without pain. Neck supple.  Full range of motion of the neck  Cardiovascular: Normal rate, normal heart sounds and intact distal pulses.   Irregularly irregular rhythm, no tachycardia  Pulmonary/Chest: Effort normal. No stridor. No respiratory distress. She has decreased breath sounds.  Diminished throughout  Abdominal: Soft. Bowel sounds are normal. She exhibits no mass. There is no tenderness. There is no rebound and no guarding.  Abdomen soft and nontender  Musculoskeletal: Normal range of motion. She exhibits no edema.  Lymphadenopathy:    She has no cervical adenopathy.  Neurological: She is alert and oriented to person, place, and time.  Speech is clear and goal oriented Moves extremities without ataxia  Skin: Skin is warm and dry. No rash noted. She is not diaphoretic. No erythema.  No erythema, lesions or evidence of cellulitis  Psychiatric: She has a normal mood and affect.    ED Course  Procedures (including critical care time) Labs Review Labs Reviewed  COMPREHENSIVE METABOLIC PANEL  URINALYSIS, ROUTINE W REFLEX MICROSCOPIC  CBC WITH DIFFERENTIAL  PROTIME-INR  APTT  CBG MONITORING, ED  I-STAT TROPOININ, ED  I-STAT CG4 LACTIC ACID, ED     Imaging Review No results found.   EKG Interpretation None      MDM   Final diagnoses:  None   Shelly [name] presents to the ED with altered mental status.  Level V caveat. Daughter provides history. Patient is febrile on arrival.  Concern for sepsis vs ICH 2/2 fall.    5:59 AM AMS labs and imaging initiated. Will attempt swallow screen and if she passes, will give Tylenol.    6:07 AM Pt is unable to follow the stroke swallow screen directions. Will order PR tylenol.    Discussed with Junius Finner, PA-C who will assume care of the patient.     Dahlia Client Aevah Stansbery, PA-C 11/13/13 2952  Dierdre Forth, PA-C 11/13/13 [phone]

## 2013-11-13 NOTE — ED Notes (Signed)
Patient's daughter now at bedside Per daughter, patient is normally ambulatory via use of walker, is able to perform ADLs with minimal assistance

## 2013-11-13 NOTE — ED Notes (Signed)
Patient transported to X-ray and CT scan.  

## 2013-11-13 NOTE — Consult Note (Signed)
Reason for Consult:  Right caudate hemorrhage Referring Physician:  Dr. Kathrynn Humble (EDP Francetta Found, PA-C  Telephone consultation  Shelly [name] is an 80 y.o. female.  HPI: Called by Noland Fordyce, PA-C, who is working with Dr. Kathrynn Humble in the [facility] emergency room, regarding this patient and her CT scan findings from a neurosurgical perspective. Case discussed with Ms. Hilda Blades regarding CT scan of brain finding of right caudate hemorrhage and anticoagulation with Coumadin, with an INR of 1.75. She explains that the patient's daughter reported finding the patient having apparently fallen forward onto the floor a couple days ago. Patient was brought to the emergency room because of altered mental status. Reversal of the anticoagulation has not yet been initiated by the emergency room staff.  Past Medical History:  Past Medical History  Diagnosis Date  . Arthritis   . Hypertension   . H/O blood clots   . Bronchitis   . Chronic anemia   . GERD (gastroesophageal reflux disease)   . Diabetes mellitus   . Renal failure     Stage II, creatine clearance 40-50 cc /minute    Past Surgical History:  Past Surgical History  Procedure Laterality Date  . Tubaligation    . Replacement total knee bilateral      Family History:  Family History  Problem Relation Age of Onset  . Hypertension Daughter     Social History:  reports that she has never smoked. She has never used smokeless tobacco. She reports that she does not drink alcohol or use illicit drugs.  Allergies: No Known Allergies  Medications: I have reviewed the patient's current medications.  ROS   Results for orders placed during the hospital encounter of 11/13/13 (from the past 48 hour(s))  CBG MONITORING, ED     Status: None   Collection Time    11/13/13  5:58 AM      Result Value Ref Range   Glucose-Capillary 86  70 - 99 mg/dL   Comment 1 Documented in Chart     Comment 2 Notify RN    COMPREHENSIVE  METABOLIC PANEL     Status: Abnormal   Collection Time    11/13/13  6:35 AM      Result Value Ref Range   Sodium 136 (*) 137 - 147 mEq/L   Potassium 3.8  3.7 - 5.3 mEq/L   Chloride 97  96 - 112 mEq/L   CO2 25  19 - 32 mEq/L   Glucose, Bld 86  70 - 99 mg/dL   BUN 16  6 - 23 mg/dL   Creatinine, Ser 1.46 (*) 0.50 - 1.10 mg/dL   Calcium 9.4  8.4 - 10.5 mg/dL   Total Protein 8.6 (*) 6.0 - 8.3 g/dL   Albumin 3.4 (*) 3.5 - 5.2 g/dL   AST 26  0 - 37 U/L   ALT 11  0 - 35 U/L   Alkaline Phosphatase 42  39 - 117 U/L   Total Bilirubin 1.0  0.3 - 1.2 mg/dL   GFR calc non Af Amer 30 (*) >90 mL/min   GFR calc Af Amer 35 (*) >90 mL/min   Comment: (NOTE)     The eGFR has been calculated using the CKD EPI equation.     This calculation has not been validated in all clinical situations.     eGFR's persistently <90 mL/min signify possible Chronic Kidney     Disease.  CBC WITH DIFFERENTIAL     Status: Abnormal  Collection Time    11/13/13  6:35 AM      Result Value Ref Range   WBC 10.5  4.0 - 10.5 K/uL   RBC 5.16 (*) 3.87 - 5.11 MIL/uL   Hemoglobin 12.1  12.0 - 15.0 g/dL   HCT 35.8 (*) 36.0 - 46.0 %   MCV 69.4 (*) 78.0 - 100.0 fL   MCH 23.4 (*) 26.0 - 34.0 pg   MCHC 33.8  30.0 - 36.0 g/dL   RDW 15.8 (*) 11.5 - 15.5 %   Platelets 260  150 - 400 K/uL   Neutrophils Relative % 62  43 - 77 %   Lymphocytes Relative 26  12 - 46 %   Monocytes Relative 11  3 - 12 %   Eosinophils Relative 1  0 - 5 %   Basophils Relative 0  0 - 1 %   Neutro Abs 6.5  1.7 - 7.7 K/uL   Lymphs Abs 2.7  0.7 - 4.0 K/uL   Monocytes Absolute 1.2 (*) 0.1 - 1.0 K/uL   Eosinophils Absolute 0.1  0.0 - 0.7 K/uL   Basophils Absolute 0.0  0.0 - 0.1 K/uL   RBC Morphology TARGET CELLS    PROTIME-INR     Status: Abnormal   Collection Time    11/13/13  6:35 AM      Result Value Ref Range   Prothrombin Time 19.9 (*) 11.6 - 15.2 seconds   INR 1.75 (*) 0.00 - 1.49  APTT     Status: None   Collection Time    11/13/13  6:35 AM       Result Value Ref Range   aPTT 31  24 - 37 seconds  URINALYSIS, ROUTINE W REFLEX MICROSCOPIC     Status: Abnormal   Collection Time    11/13/13  6:57 AM      Result Value Ref Range   Color, Urine YELLOW  YELLOW   APPearance CLEAR  CLEAR   Specific Gravity, Urine 1.012  1.005 - 1.030   pH 7.5  5.0 - 8.0   Glucose, UA NEGATIVE  NEGATIVE mg/dL   Hgb urine dipstick TRACE (*) NEGATIVE   Bilirubin Urine NEGATIVE  NEGATIVE   Ketones, ur NEGATIVE  NEGATIVE mg/dL   Protein, ur 30 (*) NEGATIVE mg/dL   Urobilinogen, UA 1.0  0.0 - 1.0 mg/dL   Nitrite NEGATIVE  NEGATIVE   Leukocytes, UA NEGATIVE  NEGATIVE  I-STAT TROPOININ, ED     Status: None   Collection Time    11/13/13  6:57 AM      Result Value Ref Range   Troponin i, poc 0.08  0.00 - 0.08 ng/mL   Comment NOTIFIED PHYSICIAN     Comment 3            Comment: Due to the release kinetics of cTnI,     a negative result within the first hours     of the onset of symptoms does not rule out     myocardial infarction with certainty.     If myocardial infarction is still suspected,     repeat the test at appropriate intervals.  URINE MICROSCOPIC-ADD ON     Status: None   Collection Time    11/13/13  6:57 AM      Result Value Ref Range   RBC / HPF 0-2  <3 RBC/hpf  I-STAT CG4 LACTIC ACID, ED     Status: None   Collection Time    11/13/13  6:59 AM      Result Value Ref Range   Lactic Acid, Venous 1.28  0.5 - 2.2 mmol/L    Dg Chest 2 View  11/13/2013   CLINICAL DATA:  Altered mental status  EXAM: CHEST  2 VIEW  COMPARISON:  None.  FINDINGS: Shallow inspiration. Borderline heart size with normal pulmonary vascularity. Interstitial changes consistent with fibrosis. Bronchial wall thickening suggesting chronic bronchitis. No focal airspace disease or consolidation. Vertebral compression deformities, stable since prior study.  IMPRESSION: Borderline heart size. Interstitial fibrosis and chronic bronchitic changes in the lungs. No focal  consolidation.   Electronically Signed   By: Lucienne Capers M.D.   On: 11/13/2013 06:15   Ct Head Wo Contrast  11/13/2013   CLINICAL DATA:  Altered mental status, fall, on Coumadin.  EXAM: CT HEAD WITHOUT CONTRAST  CT CERVICAL SPINE WITHOUT CONTRAST  TECHNIQUE: Multidetector CT imaging of the head and cervical spine was performed following the standard protocol without intravenous contrast. Multiplanar CT image reconstructions of the cervical spine were also generated.  COMPARISON:  CT HEAD W/O CM dated 10/21/2012  FINDINGS: CT HEAD FINDINGS  The ventricles and sulci are normal for age. 11 mm round density within the right caudate head, axial 8/11. No intraparenchymal mass effect nor midline shift. Confluent supratentorial white matter hypodensities are within normal range for patient's age and though non-specific suggest sequelae of chronic small vessel ischemic disease. No acute large vascular territory infarcts.  No abnormal extra-axial fluid collections. Stable subcentimeter calcification along the interhemispheric fissure. Basal cisterns are patent. Moderate calcific atherosclerosis of the carotid siphons.  No skull fracture. The included ocular globes and orbital contents are non-suspicious. The mastoid aircells and included paranasal sinuses are well-aerated.  CT CERVICAL SPINE FINDINGS  Cervical vertebral bodies and posterior elements are intact and aligned with straightened cervical lordosis. Moderate to severe C3-4, C5-6 and C6-7 degenerative disc disease. Atlantodental interval is 2.5 mm, widened. No destructive bony lesions. Bone mineral density is decreased. Heterogeneous thyroid gland.  IMPRESSION: CT head: 11 mm round density in right caudate head likely reflects acute hemorrhage without mass effect.  Involutional changes. Moderate to severe white matter changes may reflect chronic small vessel ischemic disease.  CT cervical spine: Straightened cervical lordosis without acute fracture nor  malalignment. However, mildly widened atlantodental interval can be seen with ligamentous laxity or injury. If clinically indicated consider follow-up MRI of the cervical spine.  Findings discussed with and reconfirmed by Dr.Nanavati on5/11/2015at6:39 AM.   Electronically Signed   By: Elon Alas   On: 11/13/2013 06:40   Ct Cervical Spine Wo Contrast  11/13/2013   CLINICAL DATA:  Altered mental status, fall, on Coumadin.  EXAM: CT HEAD WITHOUT CONTRAST  CT CERVICAL SPINE WITHOUT CONTRAST  TECHNIQUE: Multidetector CT imaging of the head and cervical spine was performed following the standard protocol without intravenous contrast. Multiplanar CT image reconstructions of the cervical spine were also generated.  COMPARISON:  CT HEAD W/O CM dated 10/21/2012  FINDINGS: CT HEAD FINDINGS  The ventricles and sulci are normal for age. 11 mm round density within the right caudate head, axial 8/11. No intraparenchymal mass effect nor midline shift. Confluent supratentorial white matter hypodensities are within normal range for patient's age and though non-specific suggest sequelae of chronic small vessel ischemic disease. No acute large vascular territory infarcts.  No abnormal extra-axial fluid collections. Stable subcentimeter calcification along the interhemispheric fissure. Basal cisterns are patent. Moderate calcific atherosclerosis of the carotid siphons.  No skull  fracture. The included ocular globes and orbital contents are non-suspicious. The mastoid aircells and included paranasal sinuses are well-aerated.  CT CERVICAL SPINE FINDINGS  Cervical vertebral bodies and posterior elements are intact and aligned with straightened cervical lordosis. Moderate to severe C3-4, C5-6 and C6-7 degenerative disc disease. Atlantodental interval is 2.5 mm, widened. No destructive bony lesions. Bone mineral density is decreased. Heterogeneous thyroid gland.  IMPRESSION: CT head: 11 mm round density in right caudate head likely  reflects acute hemorrhage without mass effect.  Involutional changes. Moderate to severe white matter changes may reflect chronic small vessel ischemic disease.  CT cervical spine: Straightened cervical lordosis without acute fracture nor malalignment. However, mildly widened atlantodental interval can be seen with ligamentous laxity or injury. If clinically indicated consider follow-up MRI of the cervical spine.  Findings discussed with and reconfirmed by Dr.Nanavati on5/11/2015at6:39 AM.   Electronically Signed   By: Elon Alas   On: 11/13/2013 06:40     Assessment/Plan: Case and images reviewed, and case discussed with Ms. Hilda Blades. The hemorrhage within the right caudate certainly is more suggestive of a stroke than a traumatic hemorrhage. In any event it is not causing any mass effect or shift, and certainly does not require neurosurgical intervention at this time. I explained that I favor prompt reversal of the patient's anticoagulation and stroke neurology consultation and medical management.  Ms. Hilda Blades indicated that she will proceed as such.  Hosie Spangle, MD 11/13/2013, 7:58 AM

## 2013-11-13 NOTE — ED Provider Notes (Signed)
6:03 AM Pt signed out by Dahlia Client Muthersbaugh at shift change, plan is to follow up on pt's labs and imaging to determine additional treatment and pt's level of care as pt will likely be admitted for AMS.  Note from previous provider reviewed. Pt is a 80yo female brought to ED via EMS for AMS.  Pt unable to answer questions.  She is accompanied by her daughter who reports a fall on Saturday, 11/11/13. Pt is on coumadin for a-fib.  Pt is normal alert and oriented.  Upon arrival to ED, pt's temp-101.9.  She has been given acetaminophen rectally as pt cannot complete swallow screen.  Pt is able to follow simple commands, will squeeze fingers, will answer "yes" when asked if she can feel sensation in left and right extremities.    CXR: interstitial fibrosis, and chronic bronchitis changes in the lungs.  No focal consolidation.  CT head: 81mm round density in right caudate head likely reflects acute hemorrhage w/o mass effect.  Resulted called in by radiology to Dr. Rhunette Croft.  INR still pending.  Pt is being placed in [place] collar.  Plan is to call neurosurgery to determine if pt needs to be transferred or if she needs intervention at [facility] by neurosurgery first.  7:06 AM  INR-1.75, will consult neurosurgery 7:58 AM Consulted with Dr. Jule Ser, neurosurgery, who does not believe bleed is traumatic in nature, more likely due to CVA.  Will consult neurology. In meantime, pt started on coumadin reversal tx.    8:03 AM consulted with Dr. Thad Ranger, neurology, pt is to be transferred to ICU bed at [facility].  In order for pt to be transferred, bed hold request had to be placed for ICU.       Junius Finner, PA-C 11/13/13 1446

## 2013-11-13 NOTE — H&P (Signed)
Chief Complaint: non traumatic ICH  HPI:                                                                                                                                         Shelly [name] is an 80 y.o. female who lives with her daughter and husband.  At baseline she does have problems with long and short term memory.  She requires around the clock assistance and walks with a walker--only short distances. Patient was placed to bed on Saturday night at 9:30 PM, at 10 PM daughter found her on the floor.  Pateint told daughter she went to turn the light off and fell.  She was placed back to bed. The next morning she was noted to still not be acting normal.  She was less talkative and responsive as usual. In addition, her depends diapers were wet multiple times (usually she is able to tell someone she needs to go to bathroom or able to walk to bathroom by herself).  She was brought to [facility] were CT head showed a right caudate head ICH.  Neurosurgery was called but after review did not feel this was a traumatic ICH.  patient was transferred to [facility] and Neurology was asked to admit.  Currently patient is stable and able to follow commands.  She has no complaints of HA.  Date last known well: Date: 11/12/2013 Time last known well: Time: 22:00 tPA Given: No: ICH and out of window  Past Medical History  Diagnosis Date  . Hypertension   . Bronchitis     "not chronic; not recurrent" (11/13/2013)  . Chronic anemia   . GERD (gastroesophageal reflux disease)   . Pulmonary embolism   . DVT (deep venous thrombosis)     "RLE?"  . Rheumatoid arthritis   . Stroke     11/13/2013 "she has a slow bleed"  . Chronic kidney disease (CKD), stage II (mild)     Stage II, creatine clearance 40-50 cc /minute    Past Surgical History  Procedure Laterality Date  . Replacement total knee bilateral Bilateral   . Joint replacement    . Tubal ligation    . Carpal tunnel release    . Dilation and curettage of uterus     . Cataract extraction w/ intraocular lens  implant, bilateral Bilateral     Family History  Problem Relation Age of Onset  . Hypertension Daughter    Social History:  reports that she has never smoked. She has never used smokeless tobacco. She reports that she does not drink alcohol or use illicit drugs.  Allergies:  Allergies  Allergen Reactions  . Aricept [Donepezil Hcl] Nausea And Vomiting    She becomes violently ill per daughter.    Medications:  Prior to Admission:  Prescriptions prior to admission  Medication Sig Dispense Refill  . Calcium Carbonate-Vitamin D (CALCIUM 600+D) 600-400 MG-UNIT per tablet Take 1 tablet by mouth 2 (two) times daily.      Marland Kitchen diltiazem (CARDIZEM) 60 MG tablet Take 60 mg by mouth 3 (three) times daily.      Marland Kitchen HYDROcodone-acetaminophen (NORCO/VICODIN) 5-325 MG per tablet Take 1 tablet by mouth every 6 (six) hours as needed for pain.      . mirtazapine (REMERON) 15 MG tablet Take 15 mg by mouth at bedtime.      . pantoprazole (PROTONIX) 40 MG tablet Take 40 mg by mouth daily after breakfast.       . predniSONE (DELTASONE) 5 MG tablet Take 5 mg by mouth every morning.       . warfarin (COUMADIN) 4 MG tablet Take 2-4 mg by mouth at bedtime. She takes one tablet everyday except for on Mondays. She takes half a tablet on that one day.       Scheduled: Continuous:  ROS:                                                                                                                                       History obtained from daughter  General ROS: negative for - chills, fatigue, fever, night sweats, weight gain or weight loss Psychological ROS: negative for - behavioral disorder, hallucinations, memory difficulties, mood swings or suicidal ideation Ophthalmic ROS: negative for - blurry vision, double vision, eye pain or loss of  vision ENT ROS: negative for - epistaxis, nasal discharge, oral lesions, sore throat, tinnitus or vertigo Allergy and Immunology ROS: negative for - hives or itchy/watery eyes Hematological and Lymphatic ROS: negative for - bleeding problems, bruising or swollen lymph nodes Endocrine ROS: negative for - galactorrhea, hair pattern changes, polydipsia/polyuria or temperature intolerance Respiratory ROS: negative for - cough, hemoptysis, shortness of breath or wheezing Cardiovascular ROS: negative for - chest pain, dyspnea on exertion, edema or irregular heartbeat Gastrointestinal ROS: negative for - abdominal pain, diarrhea, hematemesis, nausea/vomiting or stool incontinence Genito-Urinary ROS: negative for - dysuria, hematuria, incontinence or urinary frequency/urgency Musculoskeletal ROS: negative for - joint swelling or muscular weakness Neurological ROS: as noted in HPI Dermatological ROS: negative for rash and skin lesion changes   General Exam: Lungs: CTAB ABD: Soft NT, ND Heart: S1, S2  Audible, Irregular.  Skin: WDI   Neurologic Examination:  Blood pressure 145/79, pulse 72, temperature 97.9 F (36.6 C), temperature source Oral, resp. rate 31, weight 66.679 kg (147 lb), SpO2 100.00%.  Mental Status: Alert, [facility], cannot tell me the month or year (at baseline would not be able to per daughter).  Speech is minimal and takes a long time for patient to respond. When she does speak it is fluent without evidence of aphasia.  Able to follow simple one step commands.  Cranial Nerves: II: Discs flat bilaterally; Visual fields blinks to threat bilaterally, pupils equal, round, reactive to light and accommodation III,IV, VI: ptosis not present, extra-ocular motions intact bilaterally V,VII: smile asymmetric on the right, facial light touch sensation difficult to assess  due to mentation but appears intact bilaterally and winces to pain bilaterally VIII: hearing normal bilaterally IX,X: gag reflex present XI: bilateral shoulder shrug XII: midline tongue extension without atrophy or fasciculations  Motor: --LE exam limited by knee pain but shows 4/5 strength when noxious stimuli given and she withdrawals --Right UE shows good 5/5 strength --Left UE shows 4/5 strength but when asked to hold arm up for 10 seconds she allows it to drop to the bed after 5.  Tone and bulk:normal tone throughout; no atrophy noted Sensory: Pinprick and light touch intact throughout, bilaterally Deep Tendon Reflexes:  Right: Upper Extremity   Left: Upper extremity   biceps (C-5 to C-6) 2/4   biceps (C-5 to C-6) 2/4 tricep (C7) 2/4    triceps (C7) 2/4 Brachioradialis (C6) 2/4  Brachioradialis (C6) 2/4  Lower Extremity Lower Extremity  quadriceps (L-2 to L-4) 0/4   quadriceps (L-2 to L-4) 0/4 Achilles (S1) 0/4   Achilles (S1) 0/4  Plantars: Right: downgoing   Left: downgoing Cerebellar: normal finger-to-nose,  Unable to attain heel-to-shin test Gait: not tested CV: pulses palpable throughout    Lab Results: Basic Metabolic Panel:  Recent Labs Lab 11/13/13 0635  NA 136*  K 3.8  CL 97  CO2 25  GLUCOSE 86  BUN 16  CREATININE 1.46*  CALCIUM 9.4    Liver Function Tests:  Recent Labs Lab 11/13/13 0635  AST 26  ALT 11  ALKPHOS 42  BILITOT 1.0  PROT 8.6*  ALBUMIN 3.4*   No results found for this basename: LIPASE, AMYLASE,  in the last 168 hours No results found for this basename: AMMONIA,  in the last 168 hours  CBC:  Recent Labs Lab 11/13/13 0635  WBC 10.5  NEUTROABS 6.5  HGB 12.1  HCT 35.8*  MCV 69.4*  PLT 260    Cardiac Enzymes: No results found for this basename: CKTOTAL, CKMB, CKMBINDEX, TROPONINI,  in the last 168 hours  Lipid Panel: No results found for this basename: CHOL, TRIG, HDL, CHOLHDL, VLDL, LDLCALC,  in the last 168  hours  CBG:  Recent Labs Lab 11/13/13 0558 11/13/13 1530  GLUCAP 86 78    Microbiology: Results for orders placed during the hospital encounter of 11/13/13  MRSA PCR SCREENING     Status: Abnormal   Collection Time    11/13/13  2:43 PM      Result Value Ref Range Status   MRSA by PCR POSITIVE (*) NEGATIVE Final   Comment:            The GeneXpert MRSA Assay (FDA     approved for NASAL specimens     only), is one component of a     comprehensive MRSA colonization     surveillance program.  It is not     intended to diagnose MRSA     infection nor to guide or     monitor treatment for     MRSA infections.    Coagulation Studies:  Recent Labs  11/13/13 0635 11/13/13 1005  LABPROT 19.9* 14.3  INR 1.75* 1.13    Imaging: Dg Chest 2 View  11/13/2013   CLINICAL DATA:  Altered mental status  EXAM: CHEST  2 VIEW  COMPARISON:  None.  FINDINGS: Shallow inspiration. Borderline heart size with normal pulmonary vascularity. Interstitial changes consistent with fibrosis. Bronchial wall thickening suggesting chronic bronchitis. No focal airspace disease or consolidation. Vertebral compression deformities, stable since prior study.  IMPRESSION: Borderline heart size. Interstitial fibrosis and chronic bronchitic changes in the lungs. No focal consolidation.   Electronically Signed   By: Burman Nieves M.D.   On: 11/13/2013 06:15   Ct Head Wo Contrast  11/13/2013   CLINICAL DATA:  Altered mental status, fall, on Coumadin.  EXAM: CT HEAD WITHOUT CONTRAST  CT CERVICAL SPINE WITHOUT CONTRAST  TECHNIQUE: Multidetector CT imaging of the head and cervical spine was performed following the standard protocol without intravenous contrast. Multiplanar CT image reconstructions of the cervical spine were also generated.  COMPARISON:  CT HEAD W/O CM dated 10/21/2012  FINDINGS: CT HEAD FINDINGS  The ventricles and sulci are normal for age. 11 mm round density within the right caudate head, axial 8/11. No  intraparenchymal mass effect nor midline shift. Confluent supratentorial white matter hypodensities are within normal range for patient's age and though non-specific suggest sequelae of chronic small vessel ischemic disease. No acute large vascular territory infarcts.  No abnormal extra-axial fluid collections. Stable subcentimeter calcification along the interhemispheric fissure. Basal cisterns are patent. Moderate calcific atherosclerosis of the carotid siphons.  No skull fracture. The included ocular globes and orbital contents are non-suspicious. The mastoid aircells and included paranasal sinuses are well-aerated.  CT CERVICAL SPINE FINDINGS  Cervical vertebral bodies and posterior elements are intact and aligned with straightened cervical lordosis. Moderate to severe C3-4, C5-6 and C6-7 degenerative disc disease. Atlantodental interval is 2.5 mm, widened. No destructive bony lesions. Bone mineral density is decreased. Heterogeneous thyroid gland.  IMPRESSION: CT head: 11 mm round density in right caudate head likely reflects acute hemorrhage without mass effect.  Involutional changes. Moderate to severe white matter changes may reflect chronic small vessel ischemic disease.  CT cervical spine: Straightened cervical lordosis without acute fracture nor malalignment. However, mildly widened atlantodental interval can be seen with ligamentous laxity or injury. If clinically indicated consider follow-up MRI of the cervical spine.  Findings discussed with and reconfirmed by Dr.Nanavati on5/11/2015at6:39 AM.   Electronically Signed   By: Awilda Metro   On: 11/13/2013 06:40   Ct Cervical Spine Wo Contrast  11/13/2013   CLINICAL DATA:  Altered mental status, fall, on Coumadin.  EXAM: CT HEAD WITHOUT CONTRAST  CT CERVICAL SPINE WITHOUT CONTRAST  TECHNIQUE: Multidetector CT imaging of the head and cervical spine was performed following the standard protocol without intravenous contrast. Multiplanar CT image  reconstructions of the cervical spine were also generated.  COMPARISON:  CT HEAD W/O CM dated 10/21/2012  FINDINGS: CT HEAD FINDINGS  The ventricles and sulci are normal for age. 11 mm round density within the right caudate head, axial 8/11. No intraparenchymal mass effect nor midline shift. Confluent supratentorial white matter hypodensities are within normal range for patient's age and though non-specific suggest sequelae of chronic small vessel ischemic disease.  No acute large vascular territory infarcts.  No abnormal extra-axial fluid collections. Stable subcentimeter calcification along the interhemispheric fissure. Basal cisterns are patent. Moderate calcific atherosclerosis of the carotid siphons.  No skull fracture. The included ocular globes and orbital contents are non-suspicious. The mastoid aircells and included paranasal sinuses are well-aerated.  CT CERVICAL SPINE FINDINGS  Cervical vertebral bodies and posterior elements are intact and aligned with straightened cervical lordosis. Moderate to severe C3-4, C5-6 and C6-7 degenerative disc disease. Atlantodental interval is 2.5 mm, widened. No destructive bony lesions. Bone mineral density is decreased. Heterogeneous thyroid gland.  IMPRESSION: CT head: 11 mm round density in right caudate head likely reflects acute hemorrhage without mass effect.  Involutional changes. Moderate to severe white matter changes may reflect chronic small vessel ischemic disease.  CT cervical spine: Straightened cervical lordosis without acute fracture nor malalignment. However, mildly widened atlantodental interval can be seen with ligamentous laxity or injury. If clinically indicated consider follow-up MRI of the cervical spine.  Findings discussed with and reconfirmed by Dr.Nanavati on5/11/2015at6:39 AM.   Electronically Signed   By: Awilda Metro   On: 11/13/2013 06:40   Felicie Morn PA-C [facility] [phone]  11/13/2013, 5:45 PM   Patient seen and  examined.  Clinical course and management discussed.  Necessary edits performed.  I agree with the above.  Assessment and plan of care developed and discussed below.     Assessment: 80 y.o. female presenting after a fall on Coumadin.  Head CT reviewed and shows a right caudate ICH.  With history of hypertension, concern is for stroke.  After conversation with [facility] physicians, Coumadin reversed.  Further work up recommended.  Stroke Risk Factors - diabetes mellitus and hypertension  Recommendations: 1. HgbA1c, fasting lipid panel 2. MRI, MRA  of the brain without contrast 3. PT consult, OT consult, Speech consult 4. Echocardiogram 5. Carotid dopplers 6. Prophylactic therapy-None 7. Risk factor modification 8. Telemetry monitoring 9. Frequent neuro checks 10. Cervical spine cleared  This patient is critically ill and at significant risk of neurological worsening, death and care requires constant monitoring of vital signs, hemodynamics,respiratory and cardiac monitoring, neurological assessment, discussion with family, other specialists and medical decision making of high complexity. I spent 60 minutes of neurocritical care time  in the care of  this patient.   Thana Farr, MD [facility] [phone]  11/13/2013  5:51 PM

## 2013-11-13 NOTE — ED Notes (Addendum)
Patient arrives via EMS due to c/o "being catatonic." Patient with fever on Saturday  Patient's family also states that patient fell Saturday Per EMS, patient not at baseline--not talking and "had a lot of urinary output yesterday." Patient is non-verbal

## 2013-11-14 ENCOUNTER — Inpatient Hospital Stay (HOSPITAL_COMMUNITY): Payer: Medicare Other

## 2013-11-14 ENCOUNTER — Encounter (HOSPITAL_COMMUNITY): Payer: Self-pay | Admitting: Radiology

## 2013-11-14 LAB — GLUCOSE, CAPILLARY
Glucose-Capillary: 68 mg/dL — ABNORMAL LOW (ref 70–99)
Glucose-Capillary: 95 mg/dL (ref 70–99)
Glucose-Capillary: 88 mg/dL (ref 70–99)
Glucose-Capillary: 106 mg/dL — ABNORMAL HIGH (ref 70–99)

## 2013-11-14 LAB — PROTIME-INR
INR: 1.06 (ref 0.00–1.49)
INR: 1.11 (ref 0.00–1.49)
INR: 1.17 (ref 0.00–1.49)
Prothrombin Time: 13.6 seconds (ref 11.6–15.2)
Prothrombin Time: 14.1 seconds (ref 11.6–15.2)
Prothrombin Time: 14.7 seconds (ref 11.6–15.2)

## 2013-11-14 NOTE — Evaluation (Signed)
Occupational Therapy Evaluation Patient Details Name: Shelly [name] MRN: [id number] DOB: 23-Oct-1918 Today's Date: 11/14/2013    History of Present Illness 80 yo with Rt caudate ICH. Hx HTN, BIL TKA, PE, RA   Clinical Impression   PT admitted with Rt caudate ICH. Pt currently with functional limitiations due to the deficits listed below (see OT problem list).  Pt will benefit from skilled OT to increase their independence and safety with adls and balance to allow discharge CIR.     Follow Up Recommendations  CIR    Equipment Recommendations  3 in 1 bedside comode;Other (comment) (RW)    Recommendations for Other Services Rehab consult     Precautions / Restrictions Precautions Precautions: Fall Restrictions Weight Bearing Restrictions: No      Mobility Bed Mobility Overal bed mobility: Needs Assistance Bed Mobility: Supine to Sit     Supine to sit: Min assist;HOB elevated Sit to supine: Min assist   General bed mobility comments: visual cues and verbal cues to progress to EOB  Transfers Overall transfer level: Needs assistance Equipment used: Rolling walker (2 wheeled);2 person hand held assist Transfers: Sit to/from Stand Sit to Stand: +2 physical assistance;Mod assist         General transfer comment: cues for upright postuer    Balance Overall balance assessment: Needs assistance Sitting-balance support: No upper extremity supported Sitting balance-Leahy Scale: Fair     Standing balance support: Bilateral upper extremity supported;During functional activity Standing balance-Leahy Scale: Poor                              ADL Overall ADL's : Needs assistance/impaired     Grooming: Wash/dry face;Minimal assistance;Sitting                   Toilet Transfer: +2 for physical assistance;Moderate assistance;Stand-pivot   Toileting- Clothing Manipulation and Hygiene: Total assistance;+2 for physical assistance;Sit to/from  stand Toileting - Clothing Manipulation Details (indicate cue type and reason): total (A) for peri care       General ADL Comments: Pt verbalized need to void and completed void of bladder/ bowels on 3n1     Vision                     Perception     Praxis      Pertinent Vitals/Pain VSS     Hand Dominance Right   Extremity/Trunk Assessment Upper Extremity Assessment Upper Extremity Assessment: Overall WFL for tasks assessed   Lower Extremity Assessment Lower Extremity Assessment: Defer to PT evaluation   Cervical / Trunk Assessment Cervical / Trunk Assessment: Normal   Communication     Cognition Arousal/Alertness: Awake/alert Behavior During Therapy: Flat affect Overall Cognitive Status: History of cognitive impairments - at baseline Area of Impairment: Attention;Memory;Following commands;Safety/judgement;Awareness;Problem solving   Current Attention Level: Sustained Memory: Decreased short-term memory Following Commands: Follows one step commands with increased time Safety/Judgement: Decreased awareness of deficits Awareness: Emergent Problem Solving: Slow processing;Decreased initiation General Comments:  daughter cueing patient to answer questions. pt able to answer simple questions     General Comments       Exercises       Shoulder Instructions      Home Living Family/patient expects to be discharged to:: Private residence Living Arrangements: Children Available Help at Discharge: Family Type of Home: House Home Access: Stairs to enter [employer] of Steps: 2 Entrance Stairs-Rails: Right Home  Layout: One level               Home Equipment: Walker - 2 wheels   Additional Comments: Pt lives with daughter.       Prior Functioning/Environment Level of Independence: Needs assistance  Gait / Transfers Assistance Needed: Amb household distances with walker. ADL's / Homemaking Assistance Needed: Min A with ADLs         OT Diagnosis: Generalized weakness;Cognitive deficits   OT Problem List: Decreased strength;Decreased activity tolerance;Impaired balance (sitting and/or standing);Decreased safety awareness;Decreased knowledge of use of DME or AE;Decreased cognition;Decreased knowledge of precautions   OT Treatment/Interventions: Self-care/ADL training;Therapeutic exercise;DME and/or AE instruction;Therapeutic activities;Cognitive remediation/compensation;Patient/family education;Balance training    OT Goals(Current goals can be found in the care plan section) Acute Rehab OT Goals Patient Stated Goal: to go to the bathroom OT Goal Formulation: With patient/family Time For Goal Achievement: 11/28/13 Potential to Achieve Goals: Good  OT Frequency: Min 2X/week   Barriers to D/C:            Co-evaluation              End of Session Nurse Communication: Mobility status;Precautions  Activity Tolerance: Patient tolerated treatment well Patient left: in bed;with call bell/phone within reach;with family/visitor present   Time: 1572-6203 OT Time Calculation (min): 23 min Charges:  OT General Charges $OT Visit: 1 Procedure OT Evaluation $Initial OT Evaluation Tier I: 1 Procedure OT Treatments $Self Care/Home Management : 8-22 mins G-Codes:    Harolyn Rutherford 11/18/2013, 1:21 PM Pager: [phone]

## 2013-11-14 NOTE — Evaluation (Signed)
Clinical/Bedside Swallow Evaluation Patient Details  Name: Shelly [name] MRN: [id number] Date of Birth: 1918-08-27  Today's Date: 11/14/2013 Time: 1030-1130 SLP Time Calculation (min): 60 min  Past Medical History:  Past Medical History  Diagnosis Date  . Hypertension   . Bronchitis     "not chronic; not recurrent" (11/13/2013)  . Chronic anemia   . GERD (gastroesophageal reflux disease)   . Pulmonary embolism   . DVT (deep venous thrombosis)     "RLE?"  . Rheumatoid arthritis   . Stroke     11/13/2013 "she has a slow bleed"  . Chronic kidney disease (CKD), stage II (mild)     Stage II, creatine clearance 40-50 cc /minute   Past Surgical History:  Past Surgical History  Procedure Laterality Date  . Replacement total knee bilateral Bilateral   . Joint replacement    . Tubal ligation    . Carpal tunnel release    . Dilation and curettage of uterus    . Cataract extraction w/ intraocular lens  implant, bilateral Bilateral    HPI:  80 year old female adm with rt caudate ICH. Pt with history of HTN, Bil TKA, PE, RA.  Lives at home with husband.  2 daughters rotate staying with their parents, and 2 sons assist as well, providing 24 hour supervision and assist with ADLs. Memory deficits noted at baseline, but pt.tolerated a regular diet PTA.  RN swallow screen was not passed due to patient not following commands for oral-motor assessment.  SLP was ordered for Bedside swallow evaluation per RN.   Assessment / Plan / Recommendation Clinical Impression  Patient coughs when drinking water via straw, but appears to tolerate small sips of water from cup.  Pt. had difficulty chewing a small piece of cracker (prolonged chewing, decreased manipulation, holding of unchewed bolus).  Patient swallowed applesauce without difficulty.    Aspiration Risk  Moderate    Diet Recommendation Dysphagia 2 (Fine chop);Thin liquid   Liquid Administration via: Cup;No straw Medication Administration:  Crushed with puree Supervision: Staff to assist with self feeding;Full supervision/cueing for compensatory strategies Compensations: Slow rate;Small sips/bites;Check for pocketing Postural Changes and/or Swallow Maneuvers: Seated upright 90 degrees    Other  Recommendations Oral Care Recommendations: Oral care BID;Staff/trained caregiver to provide oral care Other Recommendations: Have oral suction available;Clarify dietary restrictions   Follow Up Recommendations  Inpatient Rehab;24 hour supervision/assistance    Frequency and Duration min 2x/week  2 weeks   Pertinent Vitals/Pain LS clear/diminished; Low grade temp.; no c/o pain         Swallow Study Prior Functional Status  Type of Home: House Available Help at Discharge: Family    General HPI: 80 year old female adm with rt caudate ICH. Pt with history of HTN, Bil TKA, PE, RA.  Lives at home with husband.  2 daughters rotate staying with their parents, and 2 sons assist as well, providing 24 hour supervision and assist with ADLs. Memory deficits noted at baseline, but pt.tolerated a regular diet PTA.  RN swallow screen was not passed due to patient not following commands for oral-motor assessment.  SLP was ordered for Bedside swallow evaluation per RN. Type of Study: Bedside swallow evaluation Previous Swallow Assessment: none Diet Prior to this Study: NPO Temperature Spikes Noted: Yes Respiratory Status: Room air History of Recent Intubation: No Behavior/Cognition: Alert;Cooperative;Doesn't follow directions;Requires cueing;Distractible;Decreased sustained attention Oral Cavity - Dentition: Dentures, top;Dentures, bottom Self-Feeding Abilities: Total assist Patient Positioning: Upright in bed Baseline Vocal Quality:  Clear Volitional Cough: Cognitively unable to elicit Volitional Swallow: Unable to elicit    Oral/Motor/Sensory Function Overall Oral Motor/Sensory Function: Appears within functional limits for tasks assessed    Ice Chips Ice chips: Within functional limits Presentation: Spoon   Thin Liquid Thin Liquid: Impaired Presentation: Spoon;Cup;Straw Pharyngeal  Phase Impairments: Suspected delayed Swallow;Cough - Immediate (Cough with straw, ok with cup sip)    Nectar Thick Nectar Thick Liquid: Not tested   Honey Thick Honey Thick Liquid: Not tested   Puree Puree: Within functional limits   Solid   GO    Solid: Impaired Oral Phase Impairments: Reduced lingual movement/coordination;Impaired mastication;Impaired anterior to posterior transit Oral Phase Functional Implications: Oral holding;Oral residue       Lenor Derrick 11/14/2013,11:38 AM

## 2013-11-14 NOTE — Progress Notes (Signed)
In to give pt a bath. Foley noted to be leaking slightly earlier in shift, but upon assessment this time there was a considerable amount of urine that had leaked on the bed pad.  Foley catheter removed due to leaking. Foley balloon only had 7cc of saline in the balloon. Foley catheter was not replaced due to nurse driven protocol.   Will continue to monitor pt.

## 2013-11-14 NOTE — Progress Notes (Signed)
2227 call made to Dr Leroy Kennedy  Discussed with MD about patient. Patient has been trying to get out of bed and confused. MD aware and stated to continue to monitor. Patient is moving all extremities without any difficulty. Patient stated that she wanted to go home. Does not appear to be in any distress. Alert/oriented to person but not to time date or situation(as noted in previous assessments). RN read CT report to MD, no further intervention required. Will continue to monitor patient.

## 2013-11-14 NOTE — Evaluation (Addendum)
Physical Therapy Evaluation Patient Details Name: Shelly [name] MRN: [id number] DOB: 13-Nov-1918 Today's Date: 11/14/2013   History of Present Illness  Pt adm with rt caudate ICH. Pt with history of HTN, Bil TKA, PE, RA.  Clinical Impression  Pt admitted with above. Pt currently with functional limitations due to the deficits listed below (see PT Problem List).  Pt will benefit from skilled PT to increase their independence and safety with mobility to allow discharge to the venue listed below.       Follow Up Recommendations CIR    Equipment Recommendations  None recommended by PT    Recommendations for Other Services       Precautions / Restrictions Precautions Precautions: Fall Restrictions Weight Bearing Restrictions: No      Mobility  Bed Mobility Overal bed mobility: Needs Assistance Bed Mobility: Supine to Sit;Sit to Supine     Supine to sit: Mod assist Sit to supine: Mod assist   General bed mobility comments: Verbal/tactile cues to initiate and for technique. Incr physical assist needed due to cognitive problems.  Transfers Overall transfer level: Needs assistance Equipment used: Rolling walker (2 wheeled) Transfers: Sit to/from Stand Sit to Stand: Mod assist;+2 safety/equipment         General transfer comment: Assist to initiate and to  bring hips up.  Ambulation/Gait Ambulation/Gait assistance: Mod assist;+2 safety/equipment Ambulation Distance (Feet): 2 Feet Assistive device: Rolling walker (2 wheeled) Gait Pattern/deviations: Decreased step length - right;Decreased step length - left;Step-to pattern     General Gait Details: Side stepped up toward the head of the bed. Verbal cues to initiate and to stand more erect.  Stairs            Wheelchair Mobility    Modified Rankin (Stroke Patients Only)       Balance Overall balance assessment: Needs assistance Sitting-balance support: No upper extremity supported Sitting balance-Leahy  Scale: Fair     Standing balance support: Bilateral upper extremity supported Standing balance-Leahy Scale: Poor                               Pertinent Vitals/Pain HR to 120's with activity. Rt ankle has lateral area that is tender to touch.    Home Living Family/patient expects to be discharged to:: Private residence Living Arrangements: Children Available Help at Discharge: Family Type of Home: House Home Access: Stairs to enter Entrance Stairs-Rails: Right Entrance Stairs-Number of Steps: 2 Home Layout: One level Home Equipment: Walker - 2 wheels Additional Comments: Pt lives with daughter.     Prior Function Level of Independence: Needs assistance   Gait / Transfers Assistance Needed: Amb household distances with walker.  ADL's / Homemaking Assistance Needed: Min A with ADLs        Hand Dominance   Dominant Hand: Right    Extremity/Trunk Assessment   Upper Extremity Assessment: Defer to OT evaluation           Lower Extremity Assessment: Generalized weakness         Communication      Cognition Arousal/Alertness: Awake/alert Behavior During Therapy: WFL for tasks assessed/performed Overall Cognitive Status: Impaired/Different from baseline Area of Impairment: Attention;Following commands;Problem solving   Current Attention Level: Sustained   Following Commands: Follows one step commands inconsistently;Follows one step commands with increased time     Problem Solving: Slow processing;Decreased initiation;Difficulty sequencing;Requires verbal cues;Requires tactile cues      General Comments  Exercises        Assessment/Plan    PT Assessment Patient needs continued PT services  PT Diagnosis Difficulty walking;Generalized weakness   PT Problem List Decreased strength;Decreased balance;Decreased mobility;Decreased cognition;Decreased knowledge of use of DME;Decreased knowledge of precautions  PT Treatment Interventions DME  instruction;Gait training;Functional mobility training;Therapeutic activities;Therapeutic exercise;Balance training;Patient/family education   PT Goals (Current goals can be found in the Care Plan section) Acute Rehab PT Goals Patient Stated Goal: Pt didn't state PT Goal Formulation: Patient unable to participate in goal setting Time For Goal Achievement: 11/28/13 Potential to Achieve Goals: Good    Frequency Min 3X/week   Barriers to discharge        Co-evaluation               End of Session Equipment Utilized During Treatment: Gait belt Activity Tolerance: Patient tolerated treatment well Patient left: in bed;with call bell/phone within reach;with bed alarm set Nurse Communication: Mobility status         Time: 4196-2229 PT Time Calculation (min): 19 min   Charges:   PT Evaluation $Initial PT Evaluation Tier I: 1 Procedure PT Treatments $Gait Training: 8-22 mins   PT G CodesAngelina Ok [facility] 11/14/2013, 10:44 AM  Skip Mayer PT [phone]

## 2013-11-14 NOTE — ED Provider Notes (Addendum)
Shared service with midlevel provider. I have personally seen and examined the patient, providing direct face to face care, presenting with the chief complaint of altered mental status. Physical exam findings include confused patient, who is oriented to self and inconsistently follows even simple advise. She has no visible trauma on face, but there is a hx of fall sat night, followed by change in mentation, inability to walk etc. CT shows a small hemorrhage. INR is 1.7, as patient is on coumadin for afib.  Plan will be 1. Consult Neurosurgery. 2. Ask specifically if warfarin reversal is needed besides vit K and holding meds which have been done in the ER. 3. Transfer to [facility] and 4. Get Neurology. Code status- full code. Airway is protected. Fever likely from the ich. I have reviewed the nursing documentation on past medical history, family history, and social history.   Derwood Kaplan, MD 11/14/13 1311   CRITICAL CARE Performed by: Gigi Onstad   Total critical care time: 45 minutes  Critical care time was exclusive of separately billable procedures and treating other patients.  Critical care was necessary to treat or prevent imminent or life-threatening deterioration.  Critical care was time spent personally by me on the following activities: development of treatment plan with patient and/or surrogate as well as nursing, discussions with consultants, evaluation of patient's response to treatment, examination of patient, obtaining history from patient or surrogate, ordering and performing treatments and interventions, ordering and review of laboratory studies, ordering and review of radiographic studies, pulse oximetry and re-evaluation of patient's condition.   Derwood Kaplan, MD 11/14/13 1312

## 2013-11-14 NOTE — Progress Notes (Signed)
Rehab Admissions Coordinator Note:  Patient was screened by [name] for appropriateness for an Inpatient Acute Rehab Consult.  At this time, we are recommending Inpatient Rehab consult.  Shelly [name] 11/14/2013, 12:02 PM  I can be reached at [phone].

## 2013-11-14 NOTE — ED Provider Notes (Signed)
Shared service with midlevel provider. I have personally seen and examined the patient, providing direct face to face care, presenting with the chief complaint of altered mental status. Physical exam findings include confused patient, who is oriented to self and inconsistently follows even simple advise. She has no visible trauma on face, but there is a hx of fall sat night, followed by change in mentation, inability to walk etc. CT shows a small hemorrhage. INR is 1.7, as patient is on coumadin for afib.  Plan will be 1. Consult Neurosurgery. 2. Ask specifically if warfarin reversal is needed besides vit K and holding meds which have been done in the ER. 3. Transfer to [facility] and 4. Get Neurology. Code status- full code. Airway is protected. Fever likely from the ich. I have reviewed the nursing documentation on past medical history, family history, and social history.   Derwood Kaplan, MD 11/14/13 1311   CRITICAL CARE Performed by: Zareya Tuckett   Total critical care time: 45 minutes  Critical care time was exclusive of separately billable procedures and treating other patients.  Critical care was necessary to treat or prevent imminent or life-threatening deterioration.  Critical care was time spent personally by me on the following activities: development of treatment plan with patient and/or surrogate as well as nursing, discussions with consultants, evaluation of patient's response to treatment, examination of patient, obtaining history from patient or surrogate, ordering and performing treatments and interventions, ordering and review of laboratory studies, ordering and review of radiographic studies, pulse oximetry and re-evaluation of patient's condition.  Derwood Kaplan, MD 11/14/13 1313

## 2013-11-14 NOTE — Progress Notes (Signed)
UR Completed.  Vangie Bicker [phone] 11/14/2013

## 2013-11-14 NOTE — Progress Notes (Signed)
Stroke Team Progress Note  HISTORY Shelly [name] is a 80 y.o. female who lives with her daughter and husband. At baseline she does have problems with long and short term memory. She requires around the clock assistance and walks with a walker--only short distances. Patient was placed to bed on Saturday night at 9:30 PM, at 10 PM daughter found her on the floor. Pateint told daughter she went to turn the light off and fell. She was placed back to bed. The next morning she was noted to still not be acting normal. She was less talkative and responsive as usual. In addition, her depends diapers were wet multiple times (usually she is able to tell someone she needs to go to bathroom or able to walk to bathroom by herself). She was brought to [facility] were CT head showed a right caudate head ICH. Neurosurgery was called but after review did not feel this was a traumatic ICH. patient was transferred to [facility] and Neurology was asked to admit. Currently patient is stable and able to follow commands. She has no complaints of HA.   Date last known well: Date: 11/12/2013  Time last known well: Time: 22:00  tPA Given: No: ICH and out of window   SUBJECTIVE There are no family members present. The patient has baseline dementia. No focal deficits were noted.  OBJECTIVE Most recent Vital Signs: Filed Vitals:   11/14/13 0400 11/14/13 0413 11/14/13 0500 11/14/13 0600  BP: 146/62  134/92 136/52  Pulse: 74  80 52  Temp:  100.3 F (37.9 C)    TempSrc:  Oral    Resp: 16  28 30   Height:      Weight:      SpO2: 95%  96% 95%   CBG (last 3)   Recent Labs  11/13/13 0558 11/13/13 1530 11/13/13 1927  GLUCAP 86 78 78    IV Fluid Intake:   . sodium chloride 75 mL/hr at 11/14/13 0600    MEDICATIONS  . Chlorhexidine Gluconate Cloth  6 each Topical Q0600  . diltiazem  60 mg Oral TID  . mirtazapine  15 mg Oral QHS  . mupirocin ointment  1 application Nasal BID  . pantoprazole  40 mg Oral QPC breakfast  .  predniSONE  5 mg Oral Q breakfast  . senna-docusate  1 tablet Oral BID   PRN:  acetaminophen, acetaminophen, HYDROcodone-acetaminophen, labetalol  Diet:  Dysphagia 2 diet with thin liquids Activity:  Up with assistance DVT Prophylaxis:  SCDs  CLINICALLY SIGNIFICANT STUDIES Basic Metabolic Panel:  Recent Labs Lab 11/13/13 0635  NA 136*  K 3.8  CL 97  CO2 25  GLUCOSE 86  BUN 16  CREATININE 1.46*  CALCIUM 9.4   Liver Function Tests:  Recent Labs Lab 11/13/13 0635  AST 26  ALT 11  ALKPHOS 42  BILITOT 1.0  PROT 8.6*  ALBUMIN 3.4*   CBC:  Recent Labs Lab 11/13/13 0635  WBC 10.5  NEUTROABS 6.5  HGB 12.1  HCT 35.8*  MCV 69.4*  PLT 260   Coagulation:  Recent Labs Lab 11/13/13 1005 11/13/13 1832 11/13/13 2345 11/14/13 0437  LABPROT 14.3 13.6 13.6 14.7  INR 1.13 1.06 1.06 1.17   Cardiac Enzymes: No results found for this basename: CKTOTAL, CKMB, CKMBINDEX, TROPONINI,  in the last 168 hours Urinalysis:  Recent Labs Lab 11/13/13 0657  COLORURINE YELLOW  LABSPEC 1.012  PHURINE 7.5  GLUCOSEU NEGATIVE  HGBUR TRACE*  BILIRUBINUR NEGATIVE  KETONESUR NEGATIVE  PROTEINUR 30*  UROBILINOGEN 1.0  NITRITE NEGATIVE  LEUKOCYTESUR NEGATIVE   Lipid Panel    Component Value Date/Time   CHOL  Value: 155        ATP III CLASSIFICATION:  <200     mg/dL   Desirable  425-956  mg/dL   Borderline High  >=387    mg/dL   High        5/64/3329 0125   TRIG 36 03/04/2010 0125   HDL 60 03/04/2010 0125   CHOLHDL 2.6 03/04/2010 0125   VLDL 7 03/04/2010 0125   LDLCALC  Value: 88        Total Cholesterol/HDL:CHD Risk Coronary Heart Disease Risk Table                     Men   Women  1/2 Average Risk   3.4   3.3  Average Risk       5.0   4.4  2 X Average Risk   9.6   7.1  3 X Average Risk  23.4   11.0        Use the calculated Patient Ratio above and the CHD Risk Table to determine the patient's CHD Risk.        ATP III CLASSIFICATION (LDL):  <100     mg/dL   Optimal  518-841  mg/dL   Near  or Above                    Optimal  130-159  mg/dL   Borderline  660-630  mg/dL   High  >160     mg/dL   Very High 07/14/3233 5732   HgbA1C  Lab Results  Component Value Date   HGBA1C  Value: 6.0 (NOTE)                                                                       According to the ADA Clinical Practice Recommendations for 2011, when HbA1c is used as a screening test:   >=6.5%   Diagnostic of Diabetes Mellitus           (if abnormal result  is confirmed)  5.7-6.4%   Increased risk of developing Diabetes Mellitus  References:Diagnosis and Classification of Diabetes Mellitus,Diabetes Care,2011,34(Suppl 1):S62-S69 and Standards of Medical Care in         Diabetes - 2011,Diabetes Care,2011,34  (Suppl 1):S11-S61.* 03/03/2010    Urine Drug Screen:   No results found for this basename: labopia, cocainscrnur, labbenz, amphetmu, thcu, labbarb    Alcohol Level: No results found for this basename: ETH,  in the last 168 hours  Dg Chest 2 View 11/13/2013    Borderline heart size. Interstitial fibrosis and chronic bronchitic changes in the lungs. No focal consolidation.      Ct Head Wo Contrast 11/13/2013    11 mm round density in right caudate head likely reflects acute hemorrhage without mass effect.  Involutional changes. Moderate to severe white matter changes may reflect chronic small vessel ischemic disease.    CT cervical spine:  Straightened cervical lordosis without acute fracture nor malalignment. However, mildly widened atlantodental interval can be seen with ligamentous laxity or injury. If clinically indicated consider follow-up MRI of the cervical spine.  MRI of the brain    MRA of the brain    2D Echocardiogram  ejection fraction 65-70%. A small pericardial effusion was identified however there was no cardiac source of emboli noted.  Carotid Doppler  pending  CXR    EKG  Sinus tachycardia rate 119 beats per minute. For complete results please see formal report.   Therapy  Recommendations pending  Physical Exam   Mental Status:  Alert, disoriented cannot tell me the month or year (at baseline would not be able to per daughter). Speech is minimal and takes a long time for patient to respond. When she does speak it is fluent without evidence of aphasia. Able to follow simple one step commands.  Cranial Nerves:  II: Discs flat bilaterally; Visual fields blinks to threat bilaterally, pupils equal, round, reactive to light and accommodation  III,IV, VI: ptosis not present, extra-ocular motions intact bilaterally  V,VII: smile asymmetric on the right, facial light touch sensation difficult to assess due to mentation but appears intact bilaterally and winces to pain bilaterally  VIII: hearing normal bilaterally  IX,X: gag reflex present  XI: bilateral shoulder shrug  XII: midline tongue extension without atrophy or fasciculations  Motor:  --LE exam limited by knee pain but shows 4/5 strength when noxious stimuli given and she withdrawals  --Right UE shows good 5/5 strength  --Left UE shows 4/5 strength but when asked to hold arm up for 10 seconds she allows it to drop to the bed after 5.  Tone and bulk:normal tone throughout; no atrophy noted  Sensory: Pinprick and light touch intact throughout, bilaterally  Deep Tendon Reflexes:  Right: Upper Extremity Left: Upper extremity  biceps (C-5 to C-6) 2/4 biceps (C-5 to C-6) 2/4  tricep (C7) 2/4 triceps (C7) 2/4  Brachioradialis (C6) 2/4 Brachioradialis (C6) 2/4  Lower Extremity Lower Extremity  quadriceps (L-2 to L-4) 0/4 quadriceps (L-2 to L-4) 0/4  Achilles (S1) 0/4 Achilles (S1) 0/4  Plantars:  Right: downgoing Left: downgoing  Cerebellar:  normal finger-to-nose, Unable to attain heel-to-shin test  Gait: not tested     ASSESSMENT Ms. MELODYE CLINGMAN is a 80 y.o. female presenting with a fall and subsequent intracranial hemorrhage. TPA was not indicated.a CT scan revealed an 11 mm round density in right caudate  head likely reflects acute hemorrhage without mass effect.  On warfarin prior to admission. Now off anticoagulation for secondary stroke prevention. Patient with resultant mild left upper extremity weakness. Stroke work up underway.   Hypertension  History of pulmonary embolism  History of a DVT  Previous hemorrhagic stroke  Diabetes mellitus hemoglobin A1c 6.0  Abnormal CT of the cervical spine.  Mild renal insufficiency Cr 1.46  Dementia  Cholesterol 155 LDL 88   Hospital day # 1  TREATMENT/PLAN  Continue to hold warfarin for secondary stroke prevention.  Consider inferior vena cava filter for history DVT and pulmonary embolus (no documented history of atrial fibrillation)  Await therapy evaluations  Await 2-D echo and carotid Dopplers  Continue to monitor renal function   Delton See PA-C [facility] Pager [phone] 11/14/2013, 8:05 AM This patient is critically ill and at significant risk of neurological worsening, death and care requires constant monitoring of vital signs, hemodynamics,respiratory and cardiac monitoring,review of multiple databases, neurological assessment, discussion with family, other specialists and medical decision making of high complexity. I spent 30 minutes of neurocritical care time  in the care of  this patient. I have personally obtained a history,  examined the patient, evaluated imaging results, and formulated the assessment and plan of care. I agree with the above. Delia Heady, MD   To contact Stroke Continuity provider, please refer to [email or web address]. After hours, contact General Neurology

## 2013-11-15 DIAGNOSIS — I619 Nontraumatic intracerebral hemorrhage, unspecified: Secondary | ICD-10-CM

## 2013-11-15 LAB — GLUCOSE, CAPILLARY
Glucose-Capillary: 73 mg/dL (ref 70–99)
Glucose-Capillary: 121 mg/dL — ABNORMAL HIGH (ref 70–99)
Glucose-Capillary: 117 mg/dL — ABNORMAL HIGH (ref 70–99)
Glucose-Capillary: 136 mg/dL — ABNORMAL HIGH (ref 70–99)

## 2013-11-15 LAB — PROTIME-INR
INR: 1.22 (ref 0.00–1.49)
Prothrombin Time: 15.1 seconds (ref 11.6–15.2)

## 2013-11-15 NOTE — Progress Notes (Signed)
Stroke Team Progress Note  HISTORY Shelly [name] is a 80 y.o. female who lives with her daughter and husband. At baseline she does have problems with long and short term memory. She requires around the clock assistance and walks with a walker--only short distances. Patient was placed to bed on Saturday night at 9:30 PM, at 10 PM daughter found her on the floor. Pateint told daughter she went to turn the light off and fell. She was placed back to bed. The next morning she was noted to still not be acting normal. She was less talkative and responsive as usual. In addition, her depends diapers were wet multiple times (usually she is able to tell someone she needs to go to bathroom or able to walk to bathroom by herself). She was brought to [facility] were CT head showed a right caudate head ICH. Neurosurgery was called but after review did not feel this was a traumatic ICH. patient was transferred to [facility] and Neurology was asked to admit. Currently patient is stable and able to follow commands. She has no complaints of HA.   Date last known well: Date: 11/12/2013  Time last known well: Time: 22:00  tPA Given: No: ICH and out of window   SUBJECTIVE There are no family members present. The patient has baseline dementia. No focal deficits were noted.BP stable. Repeat head Ct stable bleed  OBJECTIVE Most recent Vital Signs: Filed Vitals:   11/15/13 0800 11/15/13 0850 11/15/13 0900 11/15/13 1000  BP: 131/74  137/67   Pulse: 70  66 35  Temp:  98.6 F (37 C)    TempSrc:  Oral    Resp:    30  Height:      Weight:      SpO2: 100%  99% 99%   CBG (last 3)   Recent Labs  11/14/13 1715 11/14/13 2130 11/15/13 0803  GLUCAP 88 106* 73    IV Fluid Intake:   . sodium chloride 75 mL/hr at 11/14/13 2232    MEDICATIONS  . Chlorhexidine Gluconate Cloth  6 each Topical Q0600  . diltiazem  60 mg Oral TID  . mirtazapine  15 mg Oral QHS  . mupirocin ointment  1 application Nasal BID  . pantoprazole  40 mg Oral  QPC breakfast  . predniSONE  5 mg Oral Q breakfast  . senna-docusate  1 tablet Oral BID   PRN:  acetaminophen, acetaminophen, HYDROcodone-acetaminophen, labetalol  Diet:  Dysphagia 2 diet with thin liquids Activity:  Up with assistance DVT Prophylaxis:  SCDs  CLINICALLY SIGNIFICANT STUDIES Basic Metabolic Panel:   Recent Labs Lab 11/13/13 0635  NA 136*  K 3.8  CL 97  CO2 25  GLUCOSE 86  BUN 16  CREATININE 1.46*  CALCIUM 9.4   Liver Function Tests:   Recent Labs Lab 11/13/13 0635  AST 26  ALT 11  ALKPHOS 42  BILITOT 1.0  PROT 8.6*  ALBUMIN 3.4*   CBC:   Recent Labs Lab 11/13/13 0635  WBC 10.5  NEUTROABS 6.5  HGB 12.1  HCT 35.8*  MCV 69.4*  PLT 260   Coagulation:   Recent Labs Lab 11/13/13 2345 11/14/13 0437 11/14/13 1110 11/15/13 0300  LABPROT 13.6 14.7 14.1 15.1  INR 1.06 1.17 1.11 1.22   Cardiac Enzymes: No results found for this basename: CKTOTAL, CKMB, CKMBINDEX, TROPONINI,  in the last 168 hours Urinalysis:   Recent Labs Lab 11/13/13 0657  COLORURINE YELLOW  LABSPEC 1.012  PHURINE 7.5  GLUCOSEU NEGATIVE  HGBUR TRACE*  BILIRUBINUR NEGATIVE  KETONESUR NEGATIVE  PROTEINUR 30*  UROBILINOGEN 1.0  NITRITE NEGATIVE  LEUKOCYTESUR NEGATIVE   Lipid Panel    Component Value Date/Time   CHOL  Value: 155        ATP III CLASSIFICATION:  <200     mg/dL   Desirable  382-505  mg/dL   Borderline High  >=397    mg/dL   High        6/73/4193 0125   TRIG 36 03/04/2010 0125   HDL 60 03/04/2010 0125   CHOLHDL 2.6 03/04/2010 0125   VLDL 7 03/04/2010 0125   LDLCALC  Value: 88        Total Cholesterol/HDL:CHD Risk Coronary Heart Disease Risk Table                     Men   Women  1/2 Average Risk   3.4   3.3  Average Risk       5.0   4.4  2 X Average Risk   9.6   7.1  3 X Average Risk  23.4   11.0        Use the calculated Patient Ratio above and the CHD Risk Table to determine the patient's CHD Risk.        ATP III CLASSIFICATION (LDL):  <100     mg/dL    Optimal  790-240  mg/dL   Near or Above                    Optimal  130-159  mg/dL   Borderline  973-532  mg/dL   High  >992     mg/dL   Very High 10/30/8339 9622   HgbA1C  Lab Results  Component Value Date   HGBA1C  Value: 6.0 (NOTE)                                                                       According to the ADA Clinical Practice Recommendations for 2011, when HbA1c is used as a screening test:   >=6.5%   Diagnostic of Diabetes Mellitus           (if abnormal result  is confirmed)  5.7-6.4%   Increased risk of developing Diabetes Mellitus  References:Diagnosis and Classification of Diabetes Mellitus,Diabetes Care,2011,34(Suppl 1):S62-S69 and Standards of Medical Care in         Diabetes - 2011,Diabetes Care,2011,34  (Suppl 1):S11-S61.* 03/03/2010    Urine Drug Screen:   No results found for this basename: labopia,  cocainscrnur,  labbenz,  amphetmu,  thcu,  labbarb    Alcohol Level: No results found for this basename: ETH,  in the last 168 hours  Dg Chest 2 View 11/13/2013    Borderline heart size. Interstitial fibrosis and chronic bronchitic changes in the lungs. No focal consolidation.      Ct Head Wo Contrast 11/13/2013    11 mm round density in right caudate head likely reflects acute hemorrhage without mass effect.  Involutional changes. Moderate to severe white matter changes may reflect chronic small vessel ischemic disease.    CT cervical spine:  Straightened cervical lordosis without acute fracture nor malalignment. However, mildly widened atlantodental interval can be  seen with ligamentous laxity or injury. If clinically indicated consider follow-up MRI of the cervical spine.     MRI of the brain    MRA of the brain    2D Echocardiogram  ejection fraction 65-70%. A small pericardial effusion was identified however there was no cardiac source of emboli noted.  Carotid Doppler  pending  CXR    EKG  Sinus tachycardia rate 119 beats per minute. For complete results  please see formal report.   Therapy Recommendations pending  Physical Exam   Mental Status:  Alert, disoriented cannot tell me the month or year (at baseline would not be able to per daughter). Speech is minimal and takes a long time for patient to respond. When she does speak it is fluent without evidence of aphasia. Able to follow simple one step commands.  Cranial Nerves:  II: Discs flat bilaterally; Visual fields blinks to threat bilaterally, pupils equal, round, reactive to light and accommodation  III,IV, VI: ptosis not present, extra-ocular motions intact bilaterally  V,VII: smile asymmetric on the right, facial light touch sensation difficult to assess due to mentation but appears intact bilaterally and winces to pain bilaterally  VIII: hearing normal bilaterally  IX,X: gag reflex present  XI: bilateral shoulder shrug  XII: midline tongue extension without atrophy or fasciculations  Motor:  --LE exam limited by knee pain but shows 4/5 strength when noxious stimuli given and she withdrawals  --Right UE shows good 5/5 strength  --Left UE shows 4/5 strength but when asked to hold arm up for 10 seconds she allows it to drop to the bed after 5.  Tone and bulk:normal tone throughout; no atrophy noted  Sensory: Pinprick and light touch intact throughout, bilaterally  Deep Tendon Reflexes:  Right: Upper Extremity Left: Upper extremity  biceps (C-5 to C-6) 2/4 biceps (C-5 to C-6) 2/4  tricep (C7) 2/4 triceps (C7) 2/4  Brachioradialis (C6) 2/4 Brachioradialis (C6) 2/4  Lower Extremity Lower Extremity  quadriceps (L-2 to L-4) 0/4 quadriceps (L-2 to L-4) 0/4  Achilles (S1) 0/4 Achilles (S1) 0/4  Plantars:  Right: downgoing Left: downgoing  Cerebellar:  normal finger-to-nose, Unable to attain heel-to-shin test  Gait: not tested     ASSESSMENT Ms. Shelly [name] is a 80 y.o. female presenting with a fall and subsequent intracranial hemorrhage. TPA was not indicated.a CT scan revealed  an 11 mm round density in right caudate head likely reflects acute hemorrhage without mass effect.  On warfarin prior to admission. Now off anticoagulation for secondary stroke prevention. Patient with resultant mild left upper extremity weakness. Stroke work up underway.   Hypertension  History of pulmonary embolism  History of a DVT  Previous hemorrhagic stroke  Diabetes mellitus hemoglobin A1c 6.0  Abnormal CT of the cervical spine.  Mild renal insufficiency Cr 1.46  Dementia  Cholesterol 155 LDL 88   Hospital day # 2  TREATMENT/PLAN  Continue to hold warfarin for secondary stroke prevention.   Mobilize out of bed  Await therapy evaluations  Await 2-D echo and carotid Dopplers  Continue to monitor renal function  Transfer to the floor  D/w daughter and answered questions   Delia Heady, MD  11/15/2013, 12:10 PM    To contact Stroke Continuity provider, please refer to [email or web address]. After hours, contact General Neurology

## 2013-11-15 NOTE — Consult Note (Signed)
Physical Medicine and Rehabilitation Consult Reason for Consult: Hemorrhagic CVA Referring Physician: Dr. Pearlean Brownie   HPI: Shelly [name] is a 80 y.o. right-handed female with history of bilateral TKR, hypertension, rheumatoid arthritis with chronic prednisone, DVT with chronic Coumadin and chronic renal insufficiency baseline creatinine 1.46. Patient independent to ambulate household distances with a walker prior to admission. Admitted 11/13/2013 after being found on the floor. By report patient attempted to turn the light off and fell out of bed. Patient with altered mental status decreased level of responsiveness. Cranial CT scan showed right caudate head ICH. Neurosurgery Dr. Newell Coral consulted advise conservative care. INR on admission of 1.75 and reversed. Followup cranial CT scan stable without hydrocephalus. MRSA PCR screening positive maintained on contact precautions. Dysphagia 2 thin liquid diet. Physical and occupational therapy evaluations completed 11/14/2013 with recommendations of physical medicine rehabilitation consult.   Review of Systems  Gastrointestinal:       GERD  Musculoskeletal: Positive for falls, joint pain and myalgias.  All other systems reviewed and are negative.  Past Medical History  Diagnosis Date  . Hypertension   . Bronchitis     "not chronic; not recurrent" (11/13/2013)  . Chronic anemia   . GERD (gastroesophageal reflux disease)   . Pulmonary embolism   . DVT (deep venous thrombosis)     "RLE?"  . Rheumatoid arthritis   . Stroke     11/13/2013 "she has a slow bleed"  . Chronic kidney disease (CKD), stage II (mild)     Stage II, creatine clearance 40-50 cc /minute   Past Surgical History  Procedure Laterality Date  . Replacement total knee bilateral Bilateral   . Joint replacement    . Tubal ligation    . Carpal tunnel release    . Dilation and curettage of uterus    . Cataract extraction w/ intraocular lens  implant, bilateral Bilateral      Family History  Problem Relation Age of Onset  . Hypertension Daughter    Social History:  reports that she has never smoked. She has never used smokeless tobacco. She reports that she does not drink alcohol or use illicit drugs. Allergies:  Allergies  Allergen Reactions  . Aricept [Donepezil Hcl] Nausea And Vomiting    She becomes violently ill per daughter.   Medications Prior to Admission  Medication Sig Dispense Refill  . Calcium Carbonate-Vitamin D (CALCIUM 600+D) 600-400 MG-UNIT per tablet Take 1 tablet by mouth 2 (two) times daily.      Marland Kitchen diltiazem (CARDIZEM) 60 MG tablet Take 60 mg by mouth 3 (three) times daily.      Marland Kitchen HYDROcodone-acetaminophen (NORCO/VICODIN) 5-325 MG per tablet Take 1 tablet by mouth every 6 (six) hours as needed for pain.      . mirtazapine (REMERON) 15 MG tablet Take 15 mg by mouth at bedtime.      . pantoprazole (PROTONIX) 40 MG tablet Take 40 mg by mouth daily after breakfast.       . predniSONE (DELTASONE) 5 MG tablet Take 5 mg by mouth every morning.       . warfarin (COUMADIN) 4 MG tablet Take 2-4 mg by mouth at bedtime. She takes one tablet everyday except for on Mondays. She takes half a tablet on that one day.        Home: Home Living Family/patient expects to be discharged to:: Private residence Living Arrangements: Children Available Help at Discharge: Family Type of Home: House Home Access: Stairs to  enter Entrance Stairs-Number of Steps: 2 Entrance Stairs-Rails: Right Home Layout: One level Home Equipment: Walker - 2 wheels Additional Comments: Pt lives with daughter.   Functional History: Prior Function Level of Independence: Needs assistance Gait / Transfers Assistance Needed: Amb household distances with walker. ADL's / Homemaking Assistance Needed: Min A with ADLs Functional Status:  Mobility: Bed Mobility Overal bed mobility: Needs Assistance Bed Mobility: Supine to Sit Supine to sit: Min assist;HOB elevated Sit to  supine: Min assist General bed mobility comments: visual cues and verbal cues to progress to EOB Transfers Overall transfer level: Needs assistance Equipment used: Rolling walker (2 wheeled);2 person hand held assist Transfers: Sit to/from Stand Sit to Stand: +2 physical assistance;Mod assist General transfer comment: cues for upright postuer Ambulation/Gait Ambulation/Gait assistance: Mod assist;+2 safety/equipment Ambulation Distance (Feet): 2 Feet Assistive device: Rolling walker (2 wheeled) Gait Pattern/deviations: Decreased step length - right;Decreased step length - left;Step-to pattern General Gait Details: Side stepped up toward the head of the bed. Verbal cues to initiate and to stand more erect.    ADL: ADL Overall ADL's : Needs assistance/impaired Grooming: Wash/dry face;Minimal Copy: +2 for physical assistance;Moderate assistance;Stand-pivot Toileting- Clothing Manipulation and Hygiene: Total assistance;+2 for physical assistance;Sit to/from stand Toileting - Clothing Manipulation Details (indicate cue type and reason): total (A) for peri care General ADL Comments: Pt verbalized need to void and completed void of bladder/ bowels on 3n1  Cognition: Cognition Overall Cognitive Status: History of cognitive impairments - at baseline Orientation Level: Oriented to person;Oriented to place;Oriented to time;Disoriented to situation Cognition Arousal/Alertness: Awake/alert Behavior During Therapy: Flat affect Overall Cognitive Status: History of cognitive impairments - at baseline Area of Impairment: Attention;Memory;Following commands;Safety/judgement;Awareness;Problem solving Current Attention Level: Sustained Memory: Decreased short-term memory Following Commands: Follows one step commands with increased time Safety/Judgement: Decreased awareness of deficits Awareness: Emergent Problem Solving: Slow processing;Decreased initiation General  Comments:  daughter cueing patient to answer questions. pt able to answer simple questions    Blood pressure 137/67, pulse 35, temperature 98.6 F (37 C), temperature source Oral, resp. rate 30, height 5\' 6"  (1.676 m), weight 80.1 kg (176 lb 9.4 oz), SpO2 99.00%. Physical Exam  Constitutional:  80 year old African American female  HENT:  Head: Normocephalic.  Eyes: EOM are normal.  Neck: Normal range of motion. Neck supple. No thyromegaly present.  Cardiovascular: Normal rate and regular rhythm.   Respiratory: Effort normal and breath sounds normal. No respiratory distress.  GI: Soft. Bowel sounds are normal. She exhibits no distension.  Musculoskeletal:  Rheumatoid changes to both hands and feet, shoulders with some discomfort also  Neurological: She is alert.  Patient was able to provide her name, age and date of birth. She did followed basic commands. Very limited medical historian. Mild left central 7. RUE 4- to 4/5 prox to distal with weakness with grip to RA. LUE 3+ to 4/5. LLE 2/5 HF, KE, 3- ankles. RLE 3 to 3+/5. No sensory deficits  Skin: Skin is warm and dry.  Psychiatric: She has a normal mood and affect. Her behavior is normal.    Results for orders placed during the hospital encounter of 11/13/13 (from the past 24 hour(s))  GLUCOSE, CAPILLARY     Status: None   Collection Time    11/14/13  5:15 PM      Result Value Ref Range   Glucose-Capillary 88  70 - 99 mg/dL  GLUCOSE, CAPILLARY     Status: Abnormal   Collection Time    11/14/13  9:30 PM      Result Value Ref Range   Glucose-Capillary 106 (*) 70 - 99 mg/dL  PROTIME-INR     Status: None   Collection Time    11/15/13  3:00 AM      Result Value Ref Range   Prothrombin Time 15.1  11.6 - 15.2 seconds   INR 1.22  0.00 - 1.49  GLUCOSE, CAPILLARY     Status: None   Collection Time    11/15/13  8:03 AM      Result Value Ref Range   Glucose-Capillary 73  70 - 99 mg/dL  GLUCOSE, CAPILLARY     Status: Abnormal    Collection Time    11/15/13 12:01 PM      Result Value Ref Range   Glucose-Capillary 121 (*) 70 - 99 mg/dL   Ct Head Wo Contrast  11/14/2013   CLINICAL DATA:  Altered mental status, recent intracranial hemorrhage after fall  EXAM: CT HEAD WITHOUT CONTRAST  TECHNIQUE: Contiguous axial images were obtained from the base of the skull through the vertex without intravenous contrast.  COMPARISON:  CT HEAD W/O CM dated 11/13/2013; CT C SPINE W/O CM dated 11/13/2013; CT HEAD W/O CM dated 10/21/2012 is  FINDINGS: Moderate atrophy and low attenuation in the deep white matter consistent with chronic involutional change. Age-related basal ganglia calcification and calcifications in the falx, stable. There is again high attenuation in the right caudate, unchanged at 18 x 10 mm. There is mild mass effect on the frontal horn of the right lateral ventricle, unchanged. There is no hydrocephalus but there is a small volume of hemorrhage layering dependently in both lateral ventricles.  No evidence of low attenuation to suggest vascular territory infarct.  IMPRESSION: Stable focus of hyperattenuation in the right caudate, again consistent with hemorrhage. Small volume hemorrhage layering dependently in both ventricles. No hydrocephalus.   Electronically Signed   By: Esperanza Heir M.D.   On: 11/14/2013 19:33    Assessment/Plan: Diagnosis: right caudate ICH 1. Does the need for close, 24 hr/day medical supervision in concert with the patient's rehab needs make it unreasonable for this patient to be served in a less intensive setting? Yes 2. Co-Morbidities requiring supervision/potential complications: RA, htn 3. Due to bladder management, bowel management, safety, skin/wound care, disease management, medication administration, pain management and patient education, does the patient require 24 hr/day rehab nursing? Yes 4. Does the patient require coordinated care of a physician, rehab nurse, PT (1-2 hrs/day, 5 days/week),  OT (1-2 hrs/day, 5 days/week) and SLP (1-2 hrs/day, 5 days/week) to address physical and functional deficits in the context of the above medical diagnosis(es)? Yes Addressing deficits in the following areas: balance, endurance, locomotion, strength, transferring, bowel/bladder control, bathing, dressing, feeding, grooming, toileting, speech, swallowing and psychosocial support 5. Can the patient actively participate in an intensive therapy program of at least 3 hrs of therapy per day at least 5 days per week? Yes 6. The potential for patient to make measurable gains while on inpatient rehab is excellent 7. Anticipated functional outcomes upon discharge from inpatient rehab are supervision  with PT, supervision with OT, supervision with SLP. 8. Estimated rehab length of stay to reach the above functional goals is: 10-14 days 9. Does the patient have adequate social supports to accommodate these discharge functional goals? Yes 10. Anticipated D/C setting: Home 11. Anticipated post D/C treatments: HH therapy and Outpatient therapy 12. Overall Rehab/Functional Prognosis: excellent  RECOMMENDATIONS: This patient's condition is appropriate for continued  rehabilitative care in the following setting: CIR Patient has agreed to participate in recommended program. Yes Note that insurance prior authorization may be required for reimbursement for recommended care.  Comment: Rehab Admissions Coordinator to follow up. Need to follow up on availability of social supports.  Thanks,  Ranelle Oyster, MD, [place] [facility]     11/15/2013

## 2013-11-16 ENCOUNTER — Inpatient Hospital Stay (HOSPITAL_COMMUNITY)
Admission: RE | Admit: 2013-11-16 | Discharge: 2013-11-28 | DRG: 945 | Disposition: A | Discharge status: Home Health | Payer: Medicare Other | Source: Intra-hospital | Attending: Physical Medicine & Rehabilitation | Admitting: Physical Medicine & Rehabilitation

## 2013-11-16 ENCOUNTER — Encounter (HOSPITAL_COMMUNITY): Payer: Self-pay | Admitting: *Deleted

## 2013-11-16 DIAGNOSIS — I619 Nontraumatic intracerebral hemorrhage, unspecified: Secondary | ICD-10-CM | POA: Diagnosis present

## 2013-11-16 DIAGNOSIS — Z8249 Family history of ischemic heart disease and other diseases of the circulatory system: Secondary | ICD-10-CM

## 2013-11-16 DIAGNOSIS — M069 Rheumatoid arthritis, unspecified: Secondary | ICD-10-CM | POA: Diagnosis present

## 2013-11-16 DIAGNOSIS — E876 Hypokalemia: Secondary | ICD-10-CM | POA: Diagnosis present

## 2013-11-16 DIAGNOSIS — R29898 Other symptoms and signs involving the musculoskeletal system: Secondary | ICD-10-CM | POA: Diagnosis present

## 2013-11-16 DIAGNOSIS — D62 Acute posthemorrhagic anemia: Secondary | ICD-10-CM | POA: Diagnosis present

## 2013-11-16 DIAGNOSIS — Z7982 Long term (current) use of aspirin: Secondary | ICD-10-CM

## 2013-11-16 DIAGNOSIS — I498 Other specified cardiac arrhythmias: Secondary | ICD-10-CM | POA: Diagnosis not present

## 2013-11-16 DIAGNOSIS — I129 Hypertensive chronic kidney disease with stage 1 through stage 4 chronic kidney disease, or unspecified chronic kidney disease: Secondary | ICD-10-CM | POA: Diagnosis present

## 2013-11-16 DIAGNOSIS — IMO0002 Reserved for concepts with insufficient information to code with codable children: Secondary | ICD-10-CM

## 2013-11-16 DIAGNOSIS — R197 Diarrhea, unspecified: Secondary | ICD-10-CM | POA: Diagnosis not present

## 2013-11-16 DIAGNOSIS — Z8673 Personal history of transient ischemic attack (TIA), and cerebral infarction without residual deficits: Secondary | ICD-10-CM

## 2013-11-16 DIAGNOSIS — I10 Essential (primary) hypertension: Secondary | ICD-10-CM | POA: Diagnosis present

## 2013-11-16 DIAGNOSIS — K219 Gastro-esophageal reflux disease without esophagitis: Secondary | ICD-10-CM | POA: Diagnosis present

## 2013-11-16 DIAGNOSIS — Z9851 Tubal ligation status: Secondary | ICD-10-CM

## 2013-11-16 DIAGNOSIS — Z5189 Encounter for other specified aftercare: Principal | ICD-10-CM

## 2013-11-16 DIAGNOSIS — Z86711 Personal history of pulmonary embolism: Secondary | ICD-10-CM

## 2013-11-16 DIAGNOSIS — Z86718 Personal history of other venous thrombosis and embolism: Secondary | ICD-10-CM

## 2013-11-16 DIAGNOSIS — Z9849 Cataract extraction status, unspecified eye: Secondary | ICD-10-CM

## 2013-11-16 DIAGNOSIS — Z961 Presence of intraocular lens: Secondary | ICD-10-CM

## 2013-11-16 DIAGNOSIS — F0390 Unspecified dementia without behavioral disturbance: Secondary | ICD-10-CM | POA: Diagnosis present

## 2013-11-16 DIAGNOSIS — Z79899 Other long term (current) drug therapy: Secondary | ICD-10-CM

## 2013-11-16 DIAGNOSIS — Z96659 Presence of unspecified artificial knee joint: Secondary | ICD-10-CM

## 2013-11-16 DIAGNOSIS — N182 Chronic kidney disease, stage 2 (mild): Secondary | ICD-10-CM | POA: Diagnosis present

## 2013-11-16 LAB — GLUCOSE, CAPILLARY
Glucose-Capillary: 87 mg/dL (ref 70–99)
Glucose-Capillary: 102 mg/dL — ABNORMAL HIGH (ref 70–99)
Glucose-Capillary: 107 mg/dL — ABNORMAL HIGH (ref 70–99)
Glucose-Capillary: 93 mg/dL (ref 70–99)

## 2013-11-16 MED ORDER — GUAIFENESIN-DM 100-10 MG/5ML PO SYRP: 5.0000 mL | ORAL_SOLUTION | Freq: Four times a day (QID) | ORAL | Status: DC | PRN

## 2013-11-16 MED ORDER — PREDNISONE 5 MG PO TABS
5.0000 mg | ORAL_TABLET | Freq: Every day | ORAL | Status: DC
  Administered 2013-11-17 – 2013-11-28 (×12): 5 mg via ORAL
  Filled 2013-11-16 (×13): qty 1

## 2013-11-16 MED ORDER — DILTIAZEM HCL 60 MG PO TABS
60.0000 mg | ORAL_TABLET | Freq: Three times a day (TID) | ORAL | Status: DC
  Administered 2013-11-16: 60 mg via ORAL
  Filled 2013-11-16 (×5): qty 1

## 2013-11-16 MED ORDER — MIRTAZAPINE 15 MG PO TABS
15.0000 mg | ORAL_TABLET | Freq: Every day | ORAL | Status: DC
  Administered 2013-11-16 – 2013-11-27 (×12): 15 mg via ORAL
  Filled 2013-11-16 (×14): qty 1

## 2013-11-16 MED ORDER — ACETAMINOPHEN 325 MG PO TABS
325.0000 mg | ORAL_TABLET | ORAL | Status: DC | PRN
  Administered 2013-11-20 – 2013-11-27 (×8): 650 mg via ORAL
  Filled 2013-11-16 (×8): qty 2

## 2013-11-16 MED ORDER — PROCHLORPERAZINE MALEATE 5 MG PO TABS
5.0000 mg | ORAL_TABLET | Freq: Four times a day (QID) | ORAL | Status: DC | PRN
  Filled 2013-11-16: qty 2

## 2013-11-16 MED ORDER — PANTOPRAZOLE SODIUM 40 MG PO TBEC
40.0000 mg | DELAYED_RELEASE_TABLET | Freq: Every day | ORAL | Status: DC
  Administered 2013-11-17 – 2013-11-28 (×12): 40 mg via ORAL
  Filled 2013-11-16 (×11): qty 1

## 2013-11-16 MED ORDER — MUPIROCIN 2 % EX OINT
1.0000 "application " | TOPICAL_OINTMENT | Freq: Two times a day (BID) | CUTANEOUS | Status: AC
  Administered 2013-11-17 – 2013-11-21 (×10): 1 via NASAL
  Filled 2013-11-16: qty 22

## 2013-11-16 MED ORDER — SENNOSIDES-DOCUSATE SODIUM 8.6-50 MG PO TABS
1.0000 | ORAL_TABLET | Freq: Two times a day (BID) | ORAL | Status: DC
  Administered 2013-11-16 – 2013-11-28 (×19): 1 via ORAL
  Filled 2013-11-16 (×19): qty 1

## 2013-11-16 MED ORDER — PROCHLORPERAZINE EDISYLATE 5 MG/ML IJ SOLN
5.0000 mg | Freq: Four times a day (QID) | INTRAMUSCULAR | Status: DC | PRN
  Filled 2013-11-16: qty 2

## 2013-11-16 MED ORDER — FLEET ENEMA 7-19 GM/118ML RE ENEM: 1.0000 | ENEMA | Freq: Once | RECTAL | Status: AC | PRN

## 2013-11-16 MED ORDER — TRAZODONE HCL 50 MG PO TABS: 25.0000 mg | ORAL_TABLET | Freq: Every evening | ORAL | Status: DC | PRN

## 2013-11-16 MED ORDER — BISACODYL 10 MG RE SUPP
10.0000 mg | Freq: Every day | RECTAL | Status: DC | PRN
  Filled 2013-11-16: qty 1

## 2013-11-16 MED ORDER — PROCHLORPERAZINE 25 MG RE SUPP
12.5000 mg | Freq: Four times a day (QID) | RECTAL | Status: DC | PRN
  Filled 2013-11-16: qty 1

## 2013-11-16 MED ORDER — ALUM & MAG HYDROXIDE-SIMETH 200-200-20 MG/5ML PO SUSP: 30.0000 mL | ORAL | Status: DC | PRN

## 2013-11-16 MED ORDER — HYDROCODONE-ACETAMINOPHEN 5-325 MG PO TABS
1.0000 | ORAL_TABLET | Freq: Four times a day (QID) | ORAL | Status: DC | PRN
  Administered 2013-11-18: 1 via ORAL
  Filled 2013-11-16: qty 1

## 2013-11-16 MED ORDER — DIPHENHYDRAMINE HCL 12.5 MG/5ML PO ELIX: 12.5000 mg | ORAL_SOLUTION | Freq: Four times a day (QID) | ORAL | Status: DC | PRN

## 2013-11-16 NOTE — Progress Notes (Signed)
Nursing Note: Pt oriented to the rehab process and what to expect.Pt able to provide most of what was needed for her history but did forget some things but remembered w/ cues form what was in the chart.MAE well.Remember that she fell and that was why she came to the hospital.Pt states she did not get hurt when she fell.Bed low,call bell in reach and bed alarm set.wbb

## 2013-11-16 NOTE — Progress Notes (Signed)
Stroke Team Progress Note  HISTORY Shelly [name] is a 80 y.o. female who lives with her daughter and husband. At baseline she does have problems with long and short term memory. She requires around the clock assistance and walks with a walker--only short distances. Patient was placed to bed on Saturday night at 9:30 PM, at 10 PM daughter found her on the floor. Pateint told daughter she went to turn the light off and fell. She was placed back to bed. The next morning she was noted to still not be acting normal. She was less talkative and responsive as usual. In addition, her depends diapers were wet multiple times (usually she is able to tell someone she needs to go to bathroom or able to walk to bathroom by herself). She was brought to [facility] were CT head showed a right caudate head ICH. Neurosurgery was called but after review did not feel this was a traumatic ICH. patient was transferred to [facility] and Neurology was asked to admit. Currently patient is stable and able to follow commands. She has no complaints of HA.   Date last known well: Date: 11/12/2013  Time last known well: Time: 22:00  tPA Given: No: ICH and out of window   SUBJECTIVE There are no family members present. The patient has baseline dementia. No focal deficits were noted.BP stable. Repeat head Ct stable bleed  OBJECTIVE Most recent Vital Signs: Filed Vitals:   11/16/13 0137 11/16/13 0616 11/16/13 0900 11/16/13 1442  BP: 136/64 142/93 161/89 102/68  Pulse: 65 58 55 68  Temp: 98.7 F (37.1 C) 97.5 F (36.4 C) 98.1 F (36.7 C) 98 F (36.7 C)  TempSrc: Oral Oral Oral Oral  Resp: 18 20 20 20   Height:      Weight:      SpO2: 98% 99% 95% 97%   CBG (last 3)   Recent Labs  11/16/13 0655 11/16/13 1207 11/16/13 1637  GLUCAP 87 102* 107*    IV Fluid Intake:   . sodium chloride 75 mL/hr at 11/16/13 0446    MEDICATIONS  . Chlorhexidine Gluconate Cloth  6 each Topical Q0600  . diltiazem  60 mg Oral TID  . mirtazapine  15  mg Oral QHS  . mupirocin ointment  1 application Nasal BID  . pantoprazole  40 mg Oral QPC breakfast  . predniSONE  5 mg Oral Q breakfast  . senna-docusate  1 tablet Oral BID   PRN:  acetaminophen, acetaminophen, HYDROcodone-acetaminophen, labetalol  Diet:  Dysphagia 2 diet with thin liquids Activity:  Up with assistance DVT Prophylaxis:  SCDs  CLINICALLY SIGNIFICANT STUDIES Basic Metabolic Panel:   Recent Labs Lab 11/13/13 0635  NA 136*  K 3.8  CL 97  CO2 25  GLUCOSE 86  BUN 16  CREATININE 1.46*  CALCIUM 9.4   Liver Function Tests:   Recent Labs Lab 11/13/13 0635  AST 26  ALT 11  ALKPHOS 42  BILITOT 1.0  PROT 8.6*  ALBUMIN 3.4*   CBC:   Recent Labs Lab 11/13/13 0635  WBC 10.5  NEUTROABS 6.5  HGB 12.1  HCT 35.8*  MCV 69.4*  PLT 260   Coagulation:   Recent Labs Lab 11/13/13 2345 11/14/13 0437 11/14/13 1110 11/15/13 0300  LABPROT 13.6 14.7 14.1 15.1  INR 1.06 1.17 1.11 1.22   Cardiac Enzymes: No results found for this basename: CKTOTAL, CKMB, CKMBINDEX, TROPONINI,  in the last 168 hours Urinalysis:   Recent Labs Lab 11/13/13 0657  COLORURINE YELLOW  LABSPEC 1.012  PHURINE 7.5  GLUCOSEU NEGATIVE  HGBUR TRACE*  BILIRUBINUR NEGATIVE  KETONESUR NEGATIVE  PROTEINUR 30*  UROBILINOGEN 1.0  NITRITE NEGATIVE  LEUKOCYTESUR NEGATIVE   Lipid Panel    Component Value Date/Time   CHOL  Value: 155        ATP III CLASSIFICATION:  <200     mg/dL   Desirable  846-962  mg/dL   Borderline High  >=952    mg/dL   High        8/41/3244 0125   TRIG 36 03/04/2010 0125   HDL 60 03/04/2010 0125   CHOLHDL 2.6 03/04/2010 0125   VLDL 7 03/04/2010 0125   LDLCALC  Value: 88        Total Cholesterol/HDL:CHD Risk Coronary Heart Disease Risk Table                     Men   Women  1/2 Average Risk   3.4   3.3  Average Risk       5.0   4.4  2 X Average Risk   9.6   7.1  3 X Average Risk  23.4   11.0        Use the calculated Patient Ratio above and the CHD Risk Table to  determine the patient's CHD Risk.        ATP III CLASSIFICATION (LDL):  <100     mg/dL   Optimal  010-272  mg/dL   Near or Above                    Optimal  130-159  mg/dL   Borderline  536-644  mg/dL   High  >034     mg/dL   Very High 7/42/5956 3875   HgbA1C  Lab Results  Component Value Date   HGBA1C  Value: 6.0 (NOTE)                                                                       According to the ADA Clinical Practice Recommendations for 2011, when HbA1c is used as a screening test:   >=6.5%   Diagnostic of Diabetes Mellitus           (if abnormal result  is confirmed)  5.7-6.4%   Increased risk of developing Diabetes Mellitus  References:Diagnosis and Classification of Diabetes Mellitus,Diabetes Care,2011,34(Suppl 1):S62-S69 and Standards of Medical Care in         Diabetes - 2011,Diabetes Care,2011,34  (Suppl 1):S11-S61.* 03/03/2010    Urine Drug Screen:   No results found for this basename: labopia,  cocainscrnur,  labbenz,  amphetmu,  thcu,  labbarb    Alcohol Level: No results found for this basename: ETH,  in the last 168 hours  Dg Chest 2 View 11/13/2013    Borderline heart size. Interstitial fibrosis and chronic bronchitic changes in the lungs. No focal consolidation.      Ct Head Wo Contrast 11/13/2013    11 mm round density in right caudate head likely reflects acute hemorrhage without mass effect.  Involutional changes. Moderate to severe white matter changes may reflect chronic small vessel ischemic disease.    CT cervical spine:  Straightened cervical lordosis without acute fracture  nor malalignment. However, mildly widened atlantodental interval can be seen with ligamentous laxity or injury. If clinically indicated consider follow-up MRI of the cervical spine.     MRI of the brain  Not done  MRA of the brain  Not done  2D Echocardiogram  ejection fraction 65-70%. A small pericardial effusion was identified however there was no cardiac source of emboli  noted.  Carotid Doppler  Not done  CXR  11/13/13 Borderline heart size. Interstitial fibrosis and chronic bronchitic  changes in the lungs. No focal consolidation   EKG  Sinus tachycardia rate 119 beats per minute. For complete results please see formal report.   Therapy Recommendations pending  Physical Exam   Mental Status:  Alert, disoriented cannot tell me the month or year (at baseline would not be able to per daughter). Speech is minimal and takes a long time for patient to respond. When she does speak it is fluent without evidence of aphasia. Able to follow simple one step commands.  Cranial Nerves:  II: Discs flat bilaterally; Visual fields blinks to threat bilaterally, pupils equal, round, reactive to light and accommodation  III,IV, VI: ptosis not present, extra-ocular motions intact bilaterally  V,VII: smile asymmetric on the right, facial light touch sensation difficult to assess due to mentation but appears intact bilaterally and winces to pain bilaterally  VIII: hearing normal bilaterally  IX,X: gag reflex present  XI: bilateral shoulder shrug  XII: midline tongue extension without atrophy or fasciculations  Motor:  --LE exam limited by knee pain but shows 4/5 strength when noxious stimuli given and she withdrawals  --Right UE shows good 5/5 strength  --Left UE shows 4/5 strength but when asked to hold arm up for 10 seconds she allows it to drop to the bed after 5.  Tone and bulk:normal tone throughout; no atrophy noted  Sensory: Pinprick and light touch intact throughout, bilaterally  Deep Tendon Reflexes:  Right: Upper Extremity Left: Upper extremity  biceps (C-5 to C-6) 2/4 biceps (C-5 to C-6) 2/4  tricep (C7) 2/4 triceps (C7) 2/4  Brachioradialis (C6) 2/4 Brachioradialis (C6) 2/4  Lower Extremity Lower Extremity  quadriceps (L-2 to L-4) 0/4 quadriceps (L-2 to L-4) 0/4  Achilles (S1) 0/4 Achilles (S1) 0/4  Plantars:  Right: downgoing Left: downgoing  Cerebellar:   normal finger-to-nose, Unable to attain heel-to-shin test  Gait: not tested     ASSESSMENT Ms. Shelly [name] is a 80 y.o. female presenting with a fall and subsequent intracranial hemorrhage. TPA was not indicated.a CT scan revealed an 11 mm round density in right caudate head likely reflects acute hemorrhage without mass effect.  On warfarin prior to admission. Now off anticoagulation for secondary stroke prevention. Patient with resultant mild left upper extremity weakness. Stroke work up underway.   Hypertension  History of pulmonary embolism  History of a DVT  Previous hemorrhagic stroke  Diabetes mellitus hemoglobin A1c 6.0  Abnormal CT of the cervical spine.  Mild renal insufficiency Cr 1.46  Dementia  Cholesterol 155 LDL 88   Hospital day # 3  TREATMENT/PLAN  Continue to hold warfarin for secondary stroke prevention.   Mobilize out of bed  Transfer to the rehab unit when bed available  D/w daughter and answered questions   Delia Heady, MD  11/16/2013, 5:50 PM    To contact Stroke Continuity provider, please refer to [email or web address]. After hours, contact General Neurology

## 2013-11-16 NOTE — Progress Notes (Signed)
Nursing Note: Received report that pt arrived shortly before 7pm.Pt resting quietly in bed.Denies pain.Pt educated on use of call bell and was able to point the call bell and the correct button for calling the nurse.wbb

## 2013-11-16 NOTE — H&P (Signed)
Physical Medicine and Rehabilitation Admission H&P  Chief Complaint   Patient presents with   .  Altered Mental Status, LUE weakness and difficulty walking.   HPI: Shelly [name] is a 80 y.o. right-handed female with history of bilateral TKR, hypertension, baseline dementia, RA with chronic prednisone, DVT with chronic Coumadin and chronic renal insufficiency baseline creatinine 1.46. Per reports on 11/11/13, patient attempted to turn the light off, and was found face down on the floor but without any injuries. She was placed back in bed but was noted to be less talkative, with altered mental status, increase in urinary incontinence as well as decreased level of responsiveness since the fall. She was admitted on 11/13/13 for workup and Cranial CT scan showed right caudate head ICH. y Dr. Newell Coral advise conservatived care with serial CCT. INR on admission of 1.75 and reversed. Followup cranial CT scan stable right caudate hemorrhage without hydrocephalus. Patient with resultant LUE weakness, delayed speech as well as problems processing. CIR recommended by therapy team and patient admitted today.  Review of Systems  HENT: Negative for hearing loss.  Eyes: Negative for blurred vision and double vision.  Respiratory: Positive for cough. Negative for shortness of breath.  Cardiovascular: Negative for chest pain and leg swelling.  Gastrointestinal: Negative for heartburn and diarrhea.  Musculoskeletal: Negative for myalgias.  Neurological: Negative for headaches.   Past Medical History   Diagnosis  Date   .  Hypertension    .  Bronchitis      "not chronic; not recurrent" (11/13/2013)   .  Chronic anemia    .  GERD (gastroesophageal reflux disease)    .  Pulmonary embolism    .  DVT (deep venous thrombosis)      "RLE?"   .  Rheumatoid arthritis    .  Stroke      11/13/2013 "she has a slow bleed"   .  Chronic kidney disease (CKD), stage II (mild)      Stage II, creatine clearance 40-50 cc  /minute    Past Surgical History   Procedure  Laterality  Date   .  Replacement total knee bilateral  Bilateral    .  Joint replacement     .  Tubal ligation     .  Carpal tunnel release     .  Dilation and curettage of uterus     .  Cataract extraction w/ intraocular lens implant, bilateral  Bilateral     Family History   Problem  Relation  Age of Onset   .  Hypertension  Daughter     Social History: Lives with daughter. Per reports that she has never smoked. She has never used smokeless tobacco. Per reports that she does not drink alcohol or use illicit drugs.  Allergies   Allergen  Reactions   .  Aricept [Donepezil Hcl]  Nausea And Vomiting     She becomes violently ill per daughter.    Medications Prior to Admission   Medication  Sig  Dispense  Refill   .  Calcium Carbonate-Vitamin D (CALCIUM 600+D) 600-400 MG-UNIT per tablet  Take 1 tablet by mouth 2 (two) times daily.     Marland Kitchen  diltiazem (CARDIZEM) 60 MG tablet  Take 60 mg by mouth 3 (three) times daily.     Marland Kitchen  HYDROcodone-acetaminophen (NORCO/VICODIN) 5-325 MG per tablet  Take 1 tablet by mouth every 6 (six) hours as needed for pain.     .  mirtazapine (REMERON) 15  MG tablet  Take 15 mg by mouth at bedtime.     .  pantoprazole (PROTONIX) 40 MG tablet  Take 40 mg by mouth daily after breakfast.     .  predniSONE (DELTASONE) 5 MG tablet  Take 5 mg by mouth every morning.     .  warfarin (COUMADIN) 4 MG tablet  Take 2-4 mg by mouth at bedtime. She takes one tablet everyday except for on Mondays. She takes half a tablet on that one day.      Home:  Home Living  Family/patient expects to be discharged to:: Private residence  Living Arrangements: Children  Available Help at Discharge: Family  Type of Home: House  Home Access: Stairs to enter  Secretary/administrator of Steps: 2  Entrance Stairs-Rails: Right  Home Layout: One level  Home Equipment: Walker - 2 wheels  Additional Comments: Pt lives with daughter.  Functional  History:  Prior Function  Level of Independence: Needs assistance  Gait / Transfers Assistance Needed: Amb household distances with walker.  ADL's / Homemaking Assistance Needed: Min A with ADLs  Functional Status:  Mobility:  Bed Mobility  Overal bed mobility: Needs Assistance  Bed Mobility: Supine to Sit  Supine to sit: Min assist;HOB elevated  Sit to supine: Min assist  General bed mobility comments: visual cues and verbal cues to progress to EOB  Transfers  Overall transfer level: Needs assistance  Equipment used: Rolling walker (2 wheeled);2 person hand held assist  Transfers: Sit to/from Stand  Sit to Stand: +2 physical assistance;Mod assist  General transfer comment: cues for upright postuer  Ambulation/Gait  Ambulation/Gait assistance: Mod assist;+2 safety/equipment  Ambulation Distance (Feet): 2 Feet  Assistive device: Rolling walker (2 wheeled)  Gait Pattern/deviations: Decreased step length - right;Decreased step length - left;Step-to pattern  General Gait Details: Side stepped up toward the head of the bed. Verbal cues to initiate and to stand more erect.   ADL:  ADL  Overall ADL's : Needs assistance/impaired  Grooming: Wash/dry face;Minimal Interior and spatial designer: +2 for physical assistance;Moderate assistance;Stand-pivot  Toileting- Clothing Manipulation and Hygiene: Total assistance;+2 for physical assistance;Sit to/from stand  Toileting - Clothing Manipulation Details (indicate cue type and reason): total (A) for peri care  General ADL Comments: Pt verbalized need to void and completed void of bladder/ bowels on 3n1  Cognition:  Cognition  Overall Cognitive Status: History of cognitive impairments - at baseline  Orientation Level: Oriented to person;Oriented to place;Oriented to time;Disoriented to situation  Cognition  Arousal/Alertness: Awake/alert  Behavior During Therapy: Flat affect  Overall Cognitive Status: History of cognitive impairments  - at baseline  Area of Impairment: Attention;Memory;Following commands;Safety/judgement;Awareness;Problem solving  Current Attention Level: Sustained  Memory: Decreased short-term memory  Following Commands: Follows one step commands with increased time  Safety/Judgement: Decreased awareness of deficits  Awareness: Emergent  Problem Solving: Slow processing;Decreased initiation  General Comments: daughter cueing patient to answer questions. pt able to answer simple questions  Physical Exam:  Blood pressure 102/68, pulse 68, temperature 98 F (36.7 C), temperature source Oral, resp. rate 20, height 5\' 6"  (1.676 m), weight 80.1 kg (176 lb 9.4 oz), SpO2 97.00%.  Physical Exam  Nursing note and vitals reviewed.  Constitutional: She appears well-developed and well-nourished.  HENT:  Head: Normocephalic and atraumatic.  Edentulous  Eyes: Pupils are equal, round, and reactive to light.  Neck: Normal range of motion. Neck supple.  Cardiovascular: Normal rate and regular rhythm.  Respiratory: Effort normal and breath sounds normal.  GI: Soft. Bowel sounds are normal. She exhibits no distension. There is no tenderness.  Musculoskeletal: She exhibits edema (pedal edema ).  RA changes bilateral hands and feet. Well healed old B-TKR and hammer toe repair incisions.  Neurological: She is alert.  Oriented to self and place. Able to follow simple one step commands.  Mild left central 7. RUE 4- to 4/5 prox to distal with weakness with grip to RA. LUE 3+ to 4/5. LLE 2/5 HF, KE, 3- ankles. RLE 3 to 3+/5. No sensory deficits  Skin: Skin is warm and dry.  Slowed responses, oriented to person and place Needs verbal and physical cues for manual muscle testing with repeated instructions Results for orders placed during the hospital encounter of 11/13/13 (from the past 48 hour(s))   GLUCOSE, CAPILLARY Status: None    Collection Time    11/14/13 5:15 PM   Result  Value  Ref Range    Glucose-Capillary  88   70 - 99 mg/dL   GLUCOSE, CAPILLARY Status: Abnormal    Collection Time    11/14/13 9:30 PM   Result  Value  Ref Range    Glucose-Capillary  106 (*)  70 - 99 mg/dL   PROTIME-INR Status: None    Collection Time    11/15/13 3:00 AM   Result  Value  Ref Range    Prothrombin Time  15.1  11.6 - 15.2 seconds    INR  1.22  0.00 - 1.49   GLUCOSE, CAPILLARY Status: None    Collection Time    11/15/13 8:03 AM   Result  Value  Ref Range    Glucose-Capillary  73  70 - 99 mg/dL   GLUCOSE, CAPILLARY Status: Abnormal    Collection Time    11/15/13 12:01 PM   Result  Value  Ref Range    Glucose-Capillary  121 (*)  70 - 99 mg/dL   GLUCOSE, CAPILLARY Status: Abnormal    Collection Time    11/15/13 3:24 PM   Result  Value  Ref Range    Glucose-Capillary  117 (*)  70 - 99 mg/dL   GLUCOSE, CAPILLARY Status: Abnormal    Collection Time    11/15/13 9:10 PM   Result  Value  Ref Range    Glucose-Capillary  136 (*)  70 - 99 mg/dL    Comment 1  Documented in Chart     Comment 2  Notify RN    GLUCOSE, CAPILLARY Status: None    Collection Time    11/16/13 6:55 AM   Result  Value  Ref Range    Glucose-Capillary  87  70 - 99 mg/dL    Comment 1  Documented in Chart     Comment 2  Notify RN    GLUCOSE, CAPILLARY Status: Abnormal    Collection Time    11/16/13 12:07 PM   Result  Value  Ref Range    Glucose-Capillary  102 (*)  70 - 99 mg/dL    Comment 1  Documented in Chart     Ct Head Wo Contrast  11/14/2013 CLINICAL DATA: Altered mental status, recent intracranial hemorrhage after fall EXAM: CT HEAD WITHOUT CONTRAST TECHNIQUE: Contiguous axial images were obtained from the base of the skull through the vertex without intravenous contrast. COMPARISON: CT HEAD W/O CM dated 11/13/2013; CT C SPINE W/O CM dated 11/13/2013; CT HEAD W/O CM dated 10/21/2012 is FINDINGS: Moderate atrophy and low attenuation in the deep white matter consistent with chronic involutional change.  Age-related basal ganglia  calcification and calcifications in the falx, stable. There is again high attenuation in the right caudate, unchanged at 18 x 10 mm. There is mild mass effect on the frontal horn of the right lateral ventricle, unchanged. There is no hydrocephalus but there is a small volume of hemorrhage layering dependently in both lateral ventricles. No evidence of low attenuation to suggest vascular territory infarct. IMPRESSION: Stable focus of hyperattenuation in the right caudate, again consistent with hemorrhage. Small volume hemorrhage layering dependently in both ventricles. No hydrocephalus. Electronically Signed By: Esperanza Heir M.D. On: 11/14/2013 19:33   Medical Problem List and Plan:  1. Functional deficits secondary to Right caudate ICH  2. PE/ H/o DVT/Anticoagulation: Mechanical: Antiembolism stockings, knee (TED hose) Bilateral lower extremities  Sequential compression devices, below knee Bilateral lower extremities  3. Pain Management: Will continue Vicodin every 6 hours prn  4. Mood: Continue Remeron at bedtime. LCSW to follow for evaluation and support.  5. Mild dementia/Neuropsych: Did not tolerate Aricept trial due to SE. This patient is not capable of making decisions on her own behalf.  6. HTN: Will monitor BP every 8 hours. Continue cardizem tid.  7. CKD: Baseline Cr-1.4 at admission. Will recheck in am.  8. RA: Will continue chronic steroids.  Post Admission Physician Evaluation:  1. Functional deficits secondary to right caudate ICH. 2. Patient is admitted to receive collaborative, interdisciplinary care between the physiatrist, rehab nursing staff, and therapy team. 3. Patient's level of medical complexity and substantial therapy needs in context of that medical necessity cannot be provided at a lesser intensity of care such as a SNF. 4. Patient has experienced substantial functional loss from his/her baseline which was documented above under the "Functional History" and "Functional  Status" headings. Judging by the patient's diagnosis, physical exam, and functional history, the patient has potential for functional progress which will result in measurable gains while on inpatient rehab. These gains will be of substantial and practical use upon discharge in facilitating mobility and self-care at the household level. 5. Physiatrist will provide 24 hour management of medical needs as well as oversight of the therapy plan/treatment and provide guidance as appropriate regarding the interaction of the two. 6. 24 hour rehab nursing will assist with bladder management, bowel management, safety, skin/wound care, disease management, medication administration and patient education and help integrate therapy concepts, techniques,education, etc. 7. PT will assess and treat for/with: pre gait, gait training, endurance , safety, equipment, neuromuscular re education. Goals are:  supervision with mobility . 8. OT will assess and treat for/with: ADLs, Cognitive perceptual skills, Neuromuscular re education, safety, endurance, equipment. Goals are:  supervision with ADLs . 9. SLP will assess and treat for/with:  assess cognition including memory attention concentration problem solve . Goals are:  supervision medication management, provide basic 10.  11. : Medical information . 12. Case Management and Social Worker will assess and treat for psychological issues and discharge planning. 13. Team conference will be held weekly to assess progress toward goals and to determine barriers to discharge. 14. Patient will receive at least 3 hours of therapy per day at least 5 days per week. 15. ELOS: 12-14days  16. Prognosis: fair Erick Colace M.D. [facility] FAAPM&R (Sports Med, Neuromuscular Med) Diplomate [place] Board of Electrodiagnostic Med   11/16/2013

## 2013-11-16 NOTE — Progress Notes (Signed)
Rehab admissions - Evaluated for possible admission.  I met with patient and her daughter.  Daughter is in agreement to inpatient rehab admission.  I talked to Dr. Leonie Man and Dr. Leonie Man is okay with inpatient rehab admission today.  Bed available and will admit today.  Call me for questions.  #[phone]

## 2013-11-16 NOTE — PMR Pre-admission (Signed)
PMR Admission Coordinator Pre-Admission Assessment  Patient: Shelly [name] is an 80 y.o., female MRN: [id number] DOB: [birth year] Height: 5\' 6"  (167.6 cm) Weight: 80.1 kg (176 lb 9.4 oz)              Insurance Information HMO: No    PPO:       PCP:       IPA:       80/20:       OTHER:   PRIMARY: Medicare A/B      Policy#: A      Subscriber: [id number] CM Name:        Phone#:       Fax#:   Pre-Cert#:        Employer: Retired Benefits:  Phone #:       Name: Checked in [place]. Date: 12/04/85     Deduct: $1260      Out of Pocket Max: none      Life Max: unlimited CIR: 100%      SNF: 100 days Outpatient: 80%     Co-Pay: 20% Home Health: 100%      Co-Pay: none DME: 80%     Co-Pay: 20% Providers: patient's choice  SECONDARY: U[employer]      Policy#: 02/03/86      Subscriber: [id number] CM Name:        Phone#:       Fax#:   Pre-Cert#:        Employer: Retired Benefits:  Phone #: [phone]     Name:   Eff. Date:       Deduct:        Out of Pocket Max:        Life Max:   CIR:        SNF:   Outpatient:       Co-Pay:   Home Health:        Co-Pay:   DME:       Co-Pay:     Emergency Contact Information Contact Information   Name Relation Home Work [place] Daughter [phone]  [phone]   Reaves,Phyllis Daughter   [phone]     Current Medical History  Patient Admitting Diagnosis:  Right caudate ICH    History of Present Illness: A 80 y.o. right-handed female with history of bilateral TKR, hypertension, rheumatoid arthritis with chronic prednisone, DVT with chronic Coumadin and chronic renal insufficiency baseline creatinine 1.46. Patient independent to ambulate household distances with a walker prior to admission. Admitted 11/13/2013 after being found on the floor. By report patient attempted to turn the light off and fell out of bed. Patient with altered mental status decreased level of responsiveness. Cranial CT scan showed right caudate head ICH.  Neurosurgery Dr. 01/13/2014 consulted advise conservative care. INR on admission of 1.75 and reversed. Followup cranial CT scan stable without hydrocephalus. MRSA PCR screening positive maintained on contact precautions. Dysphagia 2 thin liquid diet. Physical and occupational therapy evaluations completed 11/14/2013 with recommendations of physical medicine rehabilitation consult.      Total: 2=NIH  Past Medical History  Past Medical History  Diagnosis Date  . Hypertension   . Bronchitis     "not chronic; not recurrent" (11/13/2013)  . Chronic anemia   . GERD (gastroesophageal reflux disease)   . Pulmonary embolism   . DVT (deep venous thrombosis)     "RLE?"  . Rheumatoid arthritis   . Stroke     11/13/2013 "she has a slow bleed"  .  Chronic kidney disease (CKD), stage II (mild)     Stage II, creatine clearance 40-50 cc /minute    Family History  family history includes Hypertension in her daughter.  Prior Rehab/Hospitalizations:  Had HH therapies after previous TKRs.   Current Medications  Current facility-administered medications:0.9 %  sodium chloride infusion, , Intravenous, Continuous, Ulice Dash, PA-C, Last Rate: 75 mL/hr at 11/16/13 0446;  acetaminophen (TYLENOL) suppository 650 mg, 650 mg, Rectal, Q4H PRN, Ulice Dash, PA-C;  acetaminophen (TYLENOL) tablet 650 mg, 650 mg, Oral, Q4H PRN, Ulice Dash, PA-C;  Chlorhexidine Gluconate Cloth 2 % PADS 6 each, 6 each, Topical, Q0600, Thana Farr, MD, 6 each at 11/16/13 0446 diltiazem (CARDIZEM) tablet 60 mg, 60 mg, Oral, TID, Ulice Dash, PA-C, 60 mg at 11/16/13 0944;  HYDROcodone-acetaminophen (NORCO/VICODIN) 5-325 MG per tablet 1 tablet, 1 tablet, Oral, Q6H PRN, Ulice Dash, PA-C;  labetalol (NORMODYNE,TRANDATE) injection 10-40 mg, 10-40 mg, Intravenous, Q10 min PRN, Ulice Dash, PA-C;  mirtazapine (REMERON) tablet 15 mg, 15 mg, Oral, QHS, Ulice Dash, PA-C, 15 mg at 11/14/13 2153 mupirocin ointment (BACTROBAN) 2 % 1  application, 1 application, Nasal, BID, Thana Farr, MD, 1 application at 11/15/13 0906;  pantoprazole (PROTONIX) EC tablet 40 mg, 40 mg, Oral, QPC breakfast, Ulice Dash, PA-C, 40 mg at 11/16/13 2355;  predniSONE (DELTASONE) tablet 5 mg, 5 mg, Oral, Q breakfast, Ulice Dash, PA-C, 5 mg at 11/16/13 0945 senna-docusate (Senokot-S) tablet 1 tablet, 1 tablet, Oral, BID, Ulice Dash, PA-C, 1 tablet at 11/16/13 7322  Patients Current Diet: Dysphagia  Precautions / Restrictions Precautions Precautions: Fall Restrictions Weight Bearing Restrictions: No   Prior Activity Level Limited Community (1-2x/wk): Went out 1-2 X a week to church and to MD appointments  Home Assistive Devices / Equipment Home Assistive Devices/Equipment: Dentures (specify type);Walker (specify type);Bedside commode/3-in-1;Shower chair with back;Grab bars around toilet;Hand-held shower hose Home Equipment: Walker - 2 wheels  Prior Functional Level Prior Function Level of Independence: Needs assistance Gait / Transfers Assistance Needed: Amb household distances with walker. ADL's / Homemaking Assistance Needed: Min A with ADLs  Current Functional Level Cognition  Overall Cognitive Status: History of cognitive impairments - at baseline Current Attention Level: Sustained Orientation Level: Oriented to person;Oriented to place;Oriented to time;Disoriented to situation Following Commands: Follows one step commands with increased time Safety/Judgement: Decreased awareness of deficits General Comments: Patient able to answer simple questions.    Extremity Assessment (includes Sensation/Coordination)          ADLs  Overall ADL's : Needs assistance/impaired Grooming: Wash/dry face;Minimal assistance;Sitting Toilet Transfer: +2 for physical assistance;Moderate assistance;Stand-pivot Toileting- Clothing Manipulation and Hygiene: Total assistance;+2 for physical assistance;Sit to/from stand Toileting - Clothing  Manipulation Details (indicate cue type and reason): total (A) for peri care General ADL Comments: Pt verbalized need to void and completed void of bladder/ bowels on 3n1    Mobility  Overal bed mobility: Needs Assistance Bed Mobility: Rolling;Sidelying to Sit Rolling: Min assist Sidelying to sit: HOB elevated;Mod assist Supine to sit: Min assist;HOB elevated Sit to supine: Min assist General bed mobility comments: Mod-A needed to help slide bed pad to position pt hips during supine to sit transfer.  Mod VC provided for hand placement and leg positioning.    Transfers  Overall transfer level: Needs assistance Equipment used: Rolling walker (2 wheeled);2 person hand held assist Transfers: Sit to/from Stand Sit to Stand: +2 physical assistance;Mod assist General transfer comment: VC to lean forward before  beginning to stand and to increase hip extension in standing.  Mod-A x2 to maintain hip extension     Ambulation / Gait / Stairs / Wheelchair Mobility  Ambulation/Gait Ambulation/Gait assistance: Mod assist;+2 physical assistance Ambulation Distance (Feet): 8 Feet Assistive device: Rolling walker (2 wheeled) Gait Pattern/deviations: Step-to pattern;Decreased stride length;Decreased weight shift to left;Trunk flexed Gait velocity: decreased Gait velocity interpretation: Below normal speed for age/gender General Gait Details: Pt required postural facilitation through the hips to maintain upright posture.  Pt voided bladder after ambulating about 6 feet.    Posture / Balance      Special needs/care consideration BiPAP/CPAP No CPM No Continuous Drip IV 0.9% NS 75 ml/hr Dialysis No         Life Vest No Oxygen No Special Bed No Trach Size No Wound Vac (area) No      Skin Has wound right calf from blood clot                             Bowel mgmt: Had last BM 11/15/13 Bladder mgmt: Uses BSC and is incontinent at times, especially at night Diabetic mgmt No, per daughter Contact  Isolation: Yes    Previous Home Environment Living Arrangements: Children Available Help at Discharge: Family Type of Home: House Home Layout: One level Home Access: Stairs to enter Entrance Stairs-Rails: Right Entrance Stairs-Number of Steps: 2 Home Care Services: No Additional Comments: Pt lives with daughter.   Discharge Living Setting Plans for Discharge Living Setting: Patient's home;House;Lives with (comment) (Lives with husband and daughter.) Type of Home at Discharge: House Discharge Home Layout: One level Discharge Home Access: Stairs to enter Entrance Stairs-Number of Steps: 1 at back and 3-4 at front Does the patient have any problems obtaining your medications?: No  Social/Family/Support Systems Patient Roles: Spouse;Parent (Has a husband and 4 children.) Contact Information: Marida Pulido - daughter (h) [phone] (c) [phone] Anticipated Caregiver: Daughter [name] Ability/Limitations of Caregiver: Daughter is caregiver for patient and her dad in their home Caregiver Availability: 24/7 Discharge Plan Discussed with Primary Caregiver: Yes Is Caregiver In Agreement with Plan?: Yes Does Caregiver/Family have Issues with Lodging/Transportation while Pt is in Rehab?: No  Goals/Additional Needs Patient/Family Goal for Rehab: PT/OT/ST Supervision goals Expected length of stay: 10-14 days Cultural Considerations: [facility] Dietary Needs: Dys 2, thin liquids Equipment Needs: TBD Pt/Family Agrees to Admission and willing to participate: Yes Program Orientation Provided & Reviewed with Pt/Caregiver Including Roles  & Responsibilities: Yes  Decrease burden of Care through IP rehab admission: N/A  Possible need for SNF placement upon discharge: Not planned  Patient Condition: This patient's condition remains as documented in the consult dated 11/15/13, in which the Rehabilitation Physician determined and documented that the patient's condition is appropriate for  intensive rehabilitative care in an inpatient rehabilitation facility. Will admit to inpatient rehab today.  Preadmission Screen Completed By:  [name], 11/16/2013 3:07 PM ______________________________________________________________________   Discussed status with Dr. Wynn Banker on 11/16/13 at 1514 and received telephone approval for admission today.  Admission Coordinator:  [name], time1514/Date05/14/15

## 2013-11-16 NOTE — Progress Notes (Signed)
Physical Therapy Treatment Patient Details Name: Shelly [name] MRN: [id number] DOB: [birth year] Today's Date: 11/16/2013    History of Present Illness 80 yo with Rt caudate ICH. Hx HTN, BIL TKA, PE, RA    PT Comments    Pt was able to log roll to L side and come to sitting at EOB with min-mod assist for verbal cueing and assistance with hip mobility over bed.  Pt ambulated 8 ft with RW and mod-A +2 for verbal cueing and physical assist to maintain hip extension for standing.  Pt voided bladder while ambulating.  Pt will benefit from continued PT for further progression of functional abilities.  Will continue to follow.  Follow Up Recommendations  CIR     Equipment Recommendations  None recommended by PT    Recommendations for Other Services       Precautions / Restrictions Precautions Precautions: Fall Restrictions Weight Bearing Restrictions: No    Mobility  Bed Mobility Overal bed mobility: Needs Assistance Bed Mobility: Rolling;Sidelying to Sit Rolling: Min assist Sidelying to sit: HOB elevated;Mod assist       General bed mobility comments: Mod-A needed to help slide bed pad to position pt hips during supine to sit transfer.  Mod VC provided for hand placement and leg positioning.  Transfers Overall transfer level: Needs assistance Equipment used: Rolling walker (2 wheeled);2 person hand held assist Transfers: Sit to/from Stand Sit to Stand: +2 physical assistance;Mod assist         General transfer comment: VC to lean forward before beginning to stand and to increase hip extension in standing.  Mod-A x2 to maintain hip extension   Ambulation/Gait Ambulation/Gait assistance: Mod assist;+2 physical assistance Ambulation Distance (Feet): 8 Feet Assistive device: Rolling walker (2 wheeled) Gait Pattern/deviations: Step-to pattern;Decreased stride length;Decreased weight shift to left;Trunk flexed Gait velocity: decreased Gait velocity interpretation: Below  normal speed for age/gender General Gait Details: Pt required postural facilitation through the hips to maintain upright posture.  Pt voided bladder after ambulating about 6 feet.   Stairs            Wheelchair Mobility    Modified Rankin (Stroke Patients Only)       Balance Overall balance assessment: Needs assistance         Standing balance support: Bilateral upper extremity supported Standing balance-Leahy Scale: Poor Standing balance comment: Pt able to stand with mod-A x1 and bil. UE support on RW for 1 minute after walking and before returning to sit.                     Cognition Arousal/Alertness: Awake/alert Behavior During Therapy: Flat affect Overall Cognitive Status: History of cognitive impairments - at baseline Area of Impairment: Attention;Memory;Following commands;Safety/judgement;Awareness;Problem solving   Current Attention Level: Sustained Memory: Decreased short-term memory Following Commands: Follows one step commands with increased time Safety/Judgement: Decreased awareness of deficits Awareness: Emergent Problem Solving: Slow processing;Decreased initiation General Comments: Patient able to answer simple questions.    Exercises      General Comments        Pertinent Vitals/Pain Pt reports no pain during therapy today.    Home Living                      Prior Function            PT Goals (current goals can now be found in the care plan section) Acute Rehab PT Goals Patient Stated Goal: none stated PT  Goal Formulation: Patient unable to participate in goal setting Time For Goal Achievement: 11/28/13 Potential to Achieve Goals: Good    Frequency  Min 3X/week    PT Plan Current plan remains appropriate    Co-evaluation             End of Session Equipment Utilized During Treatment: Gait belt Activity Tolerance: Patient tolerated treatment well Patient left: in chair;with call bell/phone within  reach;with family/visitor present;with chair alarm set     Time: 5974-1638 PT Time Calculation (min): 23 min  Charges:  $Gait Training: 8-22 mins $Therapeutic Activity: 8-22 mins                    G Codes:     Bayard Males, SPT  [place], [place]  [phone] 11/16/2013, 4:08 PM

## 2013-11-16 NOTE — Discharge Summary (Signed)
Physician Discharge Summary  Patient ID: Shelly [name] MRN: [id number] DOB/AGE: May 03, 1919 80 y.o.  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 female who lives with her daughter and husband. At baseline she does have problems with long and short term memory. She requires around the clock assistance and walks with a walker--only short distances. Patient was placed to bed on Saturday night at 9:30 PM, at 10 PM daughter found her on the floor. Pateint told daughter she went to turn the light off and fell. She was placed back to bed. The next morning she was noted to still not be acting normal. She was less talkative and responsive as usual. In addition, her depends diapers were wet multiple times (usually she is able to tell someone she needs to go to bathroom or able to walk to bathroom by herself). She was brought to [facility] were CT head showed a right caudate head ICH. Neurosurgery was called but after review did not feel this was a traumatic ICH. patient was transferred to [facility] and Neurology was asked to admit. On admission patient was stable and able to follow commands. She had no complaints of HA.  Date last known well: Date: 11/12/2013  Time last known well: Time: 22:00  tPA Given: No: ICH and out of window She was admitted to neurological intensive care unit and had close neurological monitoring and blood pressure monitoring. She remained stable and was awake and following commands throughout. Repeat CT scan of the head that 24 hours showed stable appearance of hemorrhage without significant hydrocephalus, midline shift or mass effect. The patient was on warfarin at the time of admission and  this was reversed by treatment with Kcentra and serial INRs were followed by pharmacy and were normal for 2 days. She was seen by physical, occupational speech therapy and felt to be an appropriate candidate for inpatient rehabilitation. She was transferred to inpatient rehabilitation in a stable condition after insurance approval and her lability of bed on 11/16/13. Warfarin was discontinued and patient will be started on aspirin after 2 weeks and a followup CT scan showing resolution of hemorrhage  Consults: rehabilitation medicine Dr Hermelinda Medicus  Significant Diagnostic Studies: labs: CT head x 2, chest xray  Treatments: anticoagulation reversal with Theodoro Parma per pharmacy protocol  Discharge Exam: Blood pressure 102/68, pulse 68, temperature 98 F (36.7 C), temperature source Oral, resp. rate 20, height 5\' 6"  (1.676 m), weight 176 lb 9.4 oz (80.1 kg), SpO2 97.00%. Head: Normocephalic.  Eyes: EOM are normal.  Neck: Normal range of motion. Neck supple. No thyromegaly present.  Cardiovascular: Normal rate and regular rhythm.  Respiratory: Effort normal and breath sounds normal. No respiratory distress.  GI: Soft. Bowel sounds are normal. She exhibits no distension.  Musculoskeletal:  Rheumatoid changes to both hands and feet, shoulders with some discomfort also  Neurological: She is alert.  Patient was able to provide her name, age and date of birth. She did followed basic commands. Very limited medical historian. Mild left central 7. RUE 4- to 4/5 prox to distal with weakness with grip to RA. LUE 3+ to 4/5. LLE 2/5 HF, KE, 3- ankles. RLE 3 to 3+/5. No sensory deficits  Skin: Skin is warm and dry.  Psychiatric: She has  a normal mood and affect. Her behavior is normal.    Disposition:Inpatient rehab unit     Medication List         CALCIUM 600+D 600-400 MG-UNIT per tablet  Generic drug:  Calcium Carbonate-Vitamin D  Take 1 tablet by mouth 2 (two) times daily.     diltiazem 60 MG tablet   Commonly known as:  CARDIZEM  Take 60 mg by mouth 3 (three) times daily.     HYDROcodone-acetaminophen 5-325 MG per tablet  Commonly known as:  NORCO/VICODIN  Take 1 tablet by mouth every 6 (six) hours as needed for pain.     mirtazapine 15 MG tablet  Commonly known as:  REMERON  Take 15 mg by mouth at bedtime.     pantoprazole 40 MG tablet  Commonly known as:  PROTONIX  Take 40 mg by mouth daily after breakfast.     predniSONE 5 MG tablet  Commonly known as:  DELTASONE  Take 5 mg by mouth every morning.       aspirin 81 mg daily to be started 2 weeks after discharge following a repeat CT scan of the head showing resolution of brain hemorrhage   Discharge followup with Dr. Pearlean Brownie in his office in 2 months. With primary care physician in 2 weeks.        Signed: Micki Riley 11/16/2013, 5:53 PM

## 2013-11-16 NOTE — Progress Notes (Signed)
Physical Therapy Treatment Patient Details Name: Shelly [name] MRN: [id number] DOB: Feb 21, 1919 Today's Date: 11/16/2013    History of Present Illness 80 yo with Rt caudate ICH. Hx HTN, BIL TKA, PE, RA    PT Comments    Pt was able to log roll to L side and come to sitting at EOB with min-mod assist for verbal cueing and assistance with hip mobility over bed.  Pt ambulated 8 ft with RW and mod-A +2 for verbal cueing and physical assist to maintain hip extension for standing.  Pt voided bladder while ambulating.  Pt will benefit from continued PT for further progression of functional abilities.  Will continue to follow.  Follow Up Recommendations  CIR     Equipment Recommendations  None recommended by PT    Recommendations for Other Services       Precautions / Restrictions Precautions Precautions: Fall Restrictions Weight Bearing Restrictions: No    Mobility  Bed Mobility Overal bed mobility: Needs Assistance Bed Mobility: Rolling;Sidelying to Sit Rolling: Min assist Sidelying to sit: HOB elevated;Mod assist       General bed mobility comments: Mod-A needed to help slide bed pad to position pt hips during supine to sit transfer.  Mod VC provided for hand placement and leg positioning.  Transfers Overall transfer level: Needs assistance Equipment used: Rolling walker (2 wheeled);2 person hand held assist Transfers: Sit to/from Stand Sit to Stand: +2 physical assistance;Mod assist         General transfer comment: VC to lean forward before beginning to stand and to increase hip extension in standing.  Mod-A x2 to maintain hip extension   Ambulation/Gait Ambulation/Gait assistance: Mod assist;+2 physical assistance Ambulation Distance (Feet): 8 Feet Assistive device: Rolling walker (2 wheeled) Gait Pattern/deviations: Step-to pattern;Decreased stride length;Decreased weight shift to left;Trunk flexed Gait velocity: decreased Gait velocity interpretation: Below  normal speed for age/gender General Gait Details: Pt required postural facilitation through the hips to maintain upright posture.  Pt voided bladder after ambulating about 6 feet.   Stairs            Wheelchair Mobility    Modified Rankin (Stroke Patients Only)       Balance Overall balance assessment: Needs assistance         Standing balance support: Bilateral upper extremity supported Standing balance-Leahy Scale: Poor Standing balance comment: Pt able to stand with mod-A x1 and bil. UE support on RW for 1 minute after walking and before returning to sit.                     Cognition Arousal/Alertness: Awake/alert Behavior During Therapy: Flat affect Overall Cognitive Status: History of cognitive impairments - at baseline Area of Impairment: Attention;Memory;Following commands;Safety/judgement;Awareness;Problem solving   Current Attention Level: Sustained Memory: Decreased short-term memory Following Commands: Follows one step commands with increased time Safety/Judgement: Decreased awareness of deficits Awareness: Emergent Problem Solving: Slow processing;Decreased initiation General Comments: Patient able to answer simple questions.    Exercises      General Comments        Pertinent Vitals/Pain Pt reports no pain during therapy today.    Home Living                      Prior Function            PT Goals (current goals can now be found in the care plan section) Acute Rehab PT Goals Patient Stated Goal: none stated PT  Goal Formulation: Patient unable to participate in goal setting Time For Goal Achievement: 11/28/13 Potential to Achieve Goals: Good    Frequency  Min 3X/week    PT Plan Current plan remains appropriate    Co-evaluation             End of Session Equipment Utilized During Treatment: Gait belt Activity Tolerance: Patient tolerated treatment well Patient left: in chair;with call bell/phone within  reach;with family/visitor present;with chair alarm set     Time: 5462-7035 PT Time Calculation (min): 23 min  Charges:                       G Codes:      Emmert Roethler, SPT 11/16/2013, 2:49 PM

## 2013-11-17 ENCOUNTER — Inpatient Hospital Stay (HOSPITAL_COMMUNITY): Payer: Medicare Other | Admitting: Speech Pathology

## 2013-11-17 ENCOUNTER — Inpatient Hospital Stay (HOSPITAL_COMMUNITY): Payer: Medicare Other | Admitting: *Deleted

## 2013-11-17 ENCOUNTER — Inpatient Hospital Stay (HOSPITAL_COMMUNITY): Payer: Medicare Other | Admitting: Occupational Therapy

## 2013-11-17 DIAGNOSIS — I69993 Ataxia following unspecified cerebrovascular disease: Secondary | ICD-10-CM

## 2013-11-17 DIAGNOSIS — I619 Nontraumatic intracerebral hemorrhage, unspecified: Secondary | ICD-10-CM

## 2013-11-17 LAB — GLUCOSE, CAPILLARY
Glucose-Capillary: 81 mg/dL (ref 70–99)
Glucose-Capillary: 89 mg/dL (ref 70–99)
Glucose-Capillary: 112 mg/dL — ABNORMAL HIGH (ref 70–99)
Glucose-Capillary: 130 mg/dL — ABNORMAL HIGH (ref 70–99)

## 2013-11-17 LAB — CBC WITH DIFFERENTIAL/PLATELET
Basophils Absolute: 0 10*3/uL (ref 0.0–0.1)
Basophils Relative: 0 % (ref 0–1)
Eosinophils Absolute: 0.2 10*3/uL (ref 0.0–0.7)
Eosinophils Relative: 3 % (ref 0–5)
HCT: 27.5 % — ABNORMAL LOW (ref 36.0–46.0)
Hemoglobin: 9.1 g/dL — ABNORMAL LOW (ref 12.0–15.0)
Lymphocytes Relative: 34 % (ref 12–46)
Lymphs Abs: 2.1 10*3/uL (ref 0.7–4.0)
MCH: 23.3 pg — ABNORMAL LOW (ref 26.0–34.0)
MCHC: 33.1 g/dL (ref 30.0–36.0)
MCV: 70.5 fL — ABNORMAL LOW (ref 78.0–100.0)
Monocytes Absolute: 0.8 10*3/uL (ref 0.1–1.0)
Monocytes Relative: 13 % — ABNORMAL HIGH (ref 3–12)
Neutro Abs: 3.2 10*3/uL (ref 1.7–7.7)
Neutrophils Relative %: 50 % (ref 43–77)
Platelets: 206 10*3/uL (ref 150–400)
RBC: 3.9 MIL/uL (ref 3.87–5.11)
RDW: 16.1 % — ABNORMAL HIGH (ref 11.5–15.5)
WBC: 6.2 10*3/uL (ref 4.0–10.5)

## 2013-11-17 LAB — COMPREHENSIVE METABOLIC PANEL
ALT: 8 U/L (ref 0–35)
AST: 16 U/L (ref 0–37)
Albumin: 2.6 g/dL — ABNORMAL LOW (ref 3.5–5.2)
Alkaline Phosphatase: 31 U/L — ABNORMAL LOW (ref 39–117)
BUN: 8 mg/dL (ref 6–23)
CO2: 22 mEq/L (ref 19–32)
Calcium: 8.8 mg/dL (ref 8.4–10.5)
Chloride: 105 mEq/L (ref 96–112)
Creatinine, Ser: 1.08 mg/dL (ref 0.50–1.10)
GFR calc Af Amer: 50 mL/min — ABNORMAL LOW (ref >90)
GFR calc non Af Amer: 43 mL/min — ABNORMAL LOW (ref >90)
Glucose, Bld: 84 mg/dL (ref 70–99)
Potassium: 3.3 mEq/L — ABNORMAL LOW (ref 3.7–5.3)
Sodium: 139 mEq/L (ref 137–147)
Total Bilirubin: 0.6 mg/dL (ref 0.3–1.2)
Total Protein: 6.5 g/dL (ref 6.0–8.3)

## 2013-11-17 MED ORDER — DILTIAZEM HCL 30 MG PO TABS
30.0000 mg | ORAL_TABLET | Freq: Three times a day (TID) | ORAL | Status: DC
  Administered 2013-11-17 – 2013-11-28 (×33): 30 mg via ORAL
  Filled 2013-11-17 (×37): qty 1

## 2013-11-17 MED ORDER — POTASSIUM CHLORIDE CRYS ER 10 MEQ PO TBCR
10.0000 meq | EXTENDED_RELEASE_TABLET | Freq: Every day | ORAL | Status: DC
  Administered 2013-11-17 – 2013-11-28 (×12): 10 meq via ORAL
  Filled 2013-11-17 (×13): qty 1

## 2013-11-17 NOTE — Evaluation (Addendum)
Speech Language Pathology Assessment and Plan  Patient Details  Name: Shelly [name] MRN: [id number] Date of Birth: [birth year]  SLP Diagnosis: Dysphagia;Cognitive Impairments (cognitiion likely at baseline)  Rehab Potential: Good ELOS: 7-10 days for SLP only   Today's Date: 11/17/2013 Time: 0950-1050 Time Calculation (min): 60 min  Problem List:  Patient Active Problem List   Diagnosis Date Noted  . Intracranial bleed 11/13/2013  . ICH (intracerebral hemorrhage) 11/13/2013  . Other pulmonary embolism and infarction 04/11/2013  . Syncope 10/21/2012  . History of pulmonary embolism 10/21/2012  . Rheumatoid arthritis(714.0) 10/21/2012  . Hypertension 10/21/2012  . Fever 10/21/2012   Past Medical History:  Past Medical History  Diagnosis Date  . Hypertension   . Bronchitis     "not chronic; not recurrent" (11/13/2013)  . Chronic anemia   . GERD (gastroesophageal reflux disease)   . Pulmonary embolism   . DVT (deep venous thrombosis)     "RLE?"  . Rheumatoid arthritis   . Stroke     11/13/2013 "she has a slow bleed"  . Chronic kidney disease (CKD), stage II (mild)     Stage II, creatine clearance 40-50 cc /minute   Past Surgical History:  Past Surgical History  Procedure Laterality Date  . Replacement total knee bilateral Bilateral   . Joint replacement    . Tubal ligation    . Carpal tunnel release    . Dilation and curettage of uterus    . Cataract extraction w/ intraocular lens  implant, bilateral Bilateral     Assessment / Plan / Recommendation Clinical Impression [name] is a 80 y.o. right-handed female with history of bilateral TKR, hypertension, baseline dementia, RA with chronic prednisone, DVT with chronic Coumadin and chronic renal insufficiency baseline creatinine 1.46. Per reports on 11/11/13, patient attempted to turn the light off, and was found face down on the floor but without any injuries. She was placed back in bed but was noted to be less  talkative, with altered mental status, increase in urinary incontinence as well as decreased level of responsiveness since the fall. She was admitted on 11/13/13 for workup and Cranial CT scan showed right caudate head ICH.  Dr. Sherwood Gambler advised conservative care with serial CCT. INR on admission of 1.75 and reversed. Followup cranial CT scan stable right caudate hemorrhage without hydrocephalus. Patient with resultant LUE weakness, delayed speech as well as problems processing. CIR recommended by therapy team and patient admitted 11/16/2013.  Pt was seen for a bedside swallow and cognitive-linguistic evaluation.  Pt presents with overt s/s of aspiration with consecutive sips of thin liquids from the straw characterized by an immediate, strong cough.  No s/s of aspiration noted with small sips of thin liquids from the cup, purees, or mixed solid and pureed consistencies.  Additionally, pt was noted with impaired mastication and manipulation of moistened cracker consistencies secondary to her edentulous status.  Pt reports that she typically uses dentures regularly during meals; however she currently has a small, red, irritated area on her upper gums which prevents her from being able to comfortably wear her dentures.  Recommend that pt continue her current diet of dysphagia 2 solids and thin liquids with no straws and intermittent supervision for swallowing precautions.  Overall, pt presents with mild-moderate cognitive impairments for short term memory, executive function, alternating/divided attention, and safety awareness/insight.  Per pt and pt's daughter who was present at the time of the eval, pt is likely close to her cognitive baseline secondary to  a previous history of dementia.  Pt would benefit from speech therapy while inpatient for a minimum of 3 times per week for education and ongoing dysphagia management.  Pt will also need continued assistance for medication and financial management at discharge.     Skilled Therapeutic Interventions          Cognitive-linguistic evaluation completed with results and recommendations reviewed with family.    SLP Assessment  Patient will need skilled Speech Lanaguage Pathology Services during CIR admission    Recommendations  Diet Recommendations: Dysphagia 2 (Fine chop);Thin liquid Liquid Administration via: Cup;No straw Medication Administration: Whole meds with puree Supervision: Intermittent supervision to cue for compensatory strategies;Staff to assist with self feeding Compensations: Slow rate;Small sips/bites Postural Changes and/or Swallow Maneuvers: Out of bed for meals;Seated upright 90 degrees Oral Care Recommendations: Oral care BID;Staff/trained caregiver to provide oral care Patient destination: Home Follow up Recommendations: 24 hour supervision/assistance Equipment Recommended: None recommended by SLP    SLP Frequency 3 out of 7 days   SLP Treatment/Interventions Cueing hierarchy;Environmental controls;Dysphagia/aspiration precaution training;Functional tasks;Patient/family education;Therapeutic Exercise;Internal/external aids    Pain Pain Assessment Pain Assessment: No/denies pain Prior Functioning Cognitive/Linguistic Baseline: Baseline deficits Baseline deficit details: previous history of dementia Type of Home: House  Lives With: Spouse;Daughter Available Help at Discharge: Family;Available 24 hours/day Vocation: Retired  Industrial/product designer Term Goals: Week 1: SLP Short Term Goal 1 (Week 1): Pt will improve mastication and oral manipulation of solids with min assist-supervision cues for swallowing safety during trials of upgraded dys. 3 and/or regular solids.   SLP Short Term Goal 2 (Week 1): Pt will tolerate dys. 2 solids and thin liquids during functional meals and/or snacks with no overt s/sx of aspiration with min assist-supervision cues for swallowing safety.   SLP Short Term Goal 3 (Week 1): Pt will improve her safety awareness  during functional tasks for 80% accuracy and min assist.    See FIM for current functional status Refer to Care Plan for Long Term Goals  Recommendations for other services: None  Discharge Criteria: Patient will be discharged from SLP if patient refuses treatment 3 consecutive times without medical reason, if treatment goals not met, if there is a change in medical status, if patient makes no progress towards goals or if patient is discharged from hospital.  The above assessment, treatment plan, treatment alternatives and goals were discussed and mutually agreed upon: by patient and by family  Dorethea Clan.A. CFY SLP 11/17/2013, 1:01 PM

## 2013-11-17 NOTE — Care Management Note (Signed)
[facility] Individual Statement of Services  Patient Name:  Shelly [name]  Date:  11/17/2013  Welcome [facility] the [facility].  Our goal is to provide you with an individualized program based on your diagnosis and situation, designed to meet your specific needs.  With this comprehensive rehabilitation program, you will be expected to participate in at least 3 hours of rehabilitation therapies Monday-Friday, with modified therapy programming on the weekends.  Your rehabilitation program will include the following services:  Physical Therapy (PT), Occupational Therapy (OT), Speech Therapy (ST), 24 hour per day rehabilitation nursing, Therapeutic Recreaction (TR), Case Management (Social Worker), Rehabilitation Medicine, Nutrition Services and Pharmacy Services  Weekly team conferences will be held on Wednesday to discuss your progress.  Your Social Worker will talk with you frequently to get your input and to update you on team discussions.  Team conferences with you and your family in attendance may also be held.  Expected length of stay: 14-19 days Overall anticipated outcome: supervision/min level  Depending on your progress and recovery, your program may change. Your Social Worker will coordinate services and will keep you informed of any changes. Your Social Worker's name and contact numbers are listed  below.  The following services may also be recommended but are not provided by the [facility]:    Home Health Rehabiltiation Services  Outpatient Rehabilitation Services   Arrangements will be made to provide these services after discharge if needed.  Arrangements include referral to agencies that provide these services.  Your insurance has been verified to be:  Medicare & [employer] Your primary doctor is:  DR Cammie Fulp  Pertinent information will be shared with your doctor and your insurance company.  Social Worker:  Dossie Der,  [phone] or [phone]  Information discussed with and copy given to patient by: Lucy Chris, 11/17/2013, 10:54 AM

## 2013-11-17 NOTE — Progress Notes (Signed)
Physical Medicine and Rehabilitation Consult  Reason for Consult: Hemorrhagic CVA  Referring Physician: Dr. Pearlean Brownie  HPI: Shelly [name] is a 80 y.o. right-handed female with history of bilateral TKR, hypertension, rheumatoid arthritis with chronic prednisone, DVT with chronic Coumadin and chronic renal insufficiency baseline creatinine 1.46. Patient independent to ambulate household distances with a walker prior to admission. Admitted 11/13/2013 after being found on the floor. By report patient attempted to turn the light off and fell out of bed. Patient with altered mental status decreased level of responsiveness. Cranial CT scan showed right caudate head ICH. Neurosurgery Dr. Newell Coral consulted advise conservative care. INR on admission of 1.75 and reversed. Followup cranial CT scan stable without hydrocephalus. MRSA PCR screening positive maintained on contact precautions. Dysphagia 2 thin liquid diet. Physical and occupational therapy evaluations completed 11/14/2013 with recommendations of physical medicine rehabilitation consult.  Review of Systems  Gastrointestinal:  GERD  Musculoskeletal: Positive for falls, joint pain and myalgias.  All other systems reviewed and are negative.   Past Medical History   Diagnosis  Date   .  Hypertension    .  Bronchitis      "not chronic; not recurrent" (11/13/2013)   .  Chronic anemia    .  GERD (gastroesophageal reflux disease)    .  Pulmonary embolism    .  DVT (deep venous thrombosis)      "RLE?"   .  Rheumatoid arthritis    .  Stroke      11/13/2013 "she has a slow bleed"   .  Chronic kidney disease (CKD), stage II (mild)      Stage II, creatine clearance 40-50 cc /minute    Past Surgical History   Procedure  Laterality  Date   .  Replacement total knee bilateral  Bilateral    .  Joint replacement     .  Tubal ligation     .  Carpal tunnel release     .  Dilation and curettage of uterus     .  Cataract extraction w/ intraocular lens implant,  bilateral  Bilateral     Family History   Problem  Relation  Age of Onset   .  Hypertension  Daughter     Social History: reports that she has never smoked. She has never used smokeless tobacco. She reports that she does not drink alcohol or use illicit drugs.  Allergies:  Allergies   Allergen  Reactions   .  Aricept [Donepezil Hcl]  Nausea And Vomiting     She becomes violently ill per daughter.    Medications Prior to Admission   Medication  Sig  Dispense  Refill   .  Calcium Carbonate-Vitamin D (CALCIUM 600+D) 600-400 MG-UNIT per tablet  Take 1 tablet by mouth 2 (two) times daily.     Marland Kitchen  diltiazem (CARDIZEM) 60 MG tablet  Take 60 mg by mouth 3 (three) times daily.     Marland Kitchen  HYDROcodone-acetaminophen (NORCO/VICODIN) 5-325 MG per tablet  Take 1 tablet by mouth every 6 (six) hours as needed for pain.     .  mirtazapine (REMERON) 15 MG tablet  Take 15 mg by mouth at bedtime.     .  pantoprazole (PROTONIX) 40 MG tablet  Take 40 mg by mouth daily after breakfast.     .  predniSONE (DELTASONE) 5 MG tablet  Take 5 mg by mouth every morning.     .  warfarin (COUMADIN) 4 MG tablet  Take  2-4 mg by mouth at bedtime. She takes one tablet everyday except for on Mondays. She takes half a tablet on that one day.      Home:  Home Living  Family/patient expects to be discharged to:: Private residence  Living Arrangements: Children  Available Help at Discharge: Family  Type of Home: House  Home Access: Stairs to enter  Secretary/administrator of Steps: 2  Entrance Stairs-Rails: Right  Home Layout: One level  Home Equipment: Walker - 2 wheels  Additional Comments: Pt lives with daughter.  Functional History:  Prior Function  Level of Independence: Needs assistance  Gait / Transfers Assistance Needed: Amb household distances with walker.  ADL's / Homemaking Assistance Needed: Min A with ADLs  Functional Status:  Mobility:  Bed Mobility  Overal bed mobility: Needs Assistance  Bed Mobility:  Supine to Sit  Supine to sit: Min assist;HOB elevated  Sit to supine: Min assist  General bed mobility comments: visual cues and verbal cues to progress to EOB  Transfers  Overall transfer level: Needs assistance  Equipment used: Rolling walker (2 wheeled);2 person hand held assist  Transfers: Sit to/from Stand  Sit to Stand: +2 physical assistance;Mod assist  General transfer comment: cues for upright postuer  Ambulation/Gait  Ambulation/Gait assistance: Mod assist;+2 safety/equipment  Ambulation Distance (Feet): 2 Feet  Assistive device: Rolling walker (2 wheeled)  Gait Pattern/deviations: Decreased step length - right;Decreased step length - left;Step-to pattern  General Gait Details: Side stepped up toward the head of the bed. Verbal cues to initiate and to stand more erect.   ADL:  ADL  Overall ADL's : Needs assistance/impaired  Grooming: Wash/dry face;Minimal Interior and spatial designer: +2 for physical assistance;Moderate assistance;Stand-pivot  Toileting- Clothing Manipulation and Hygiene: Total assistance;+2 for physical assistance;Sit to/from stand  Toileting - Clothing Manipulation Details (indicate cue type and reason): total (A) for peri care  General ADL Comments: Pt verbalized need to void and completed void of bladder/ bowels on 3n1  Cognition:  Cognition  Overall Cognitive Status: History of cognitive impairments - at baseline  Orientation Level: Oriented to person;Oriented to place;Oriented to time;Disoriented to situation  Cognition  Arousal/Alertness: Awake/alert  Behavior During Therapy: Flat affect  Overall Cognitive Status: History of cognitive impairments - at baseline  Area of Impairment: Attention;Memory;Following commands;Safety/judgement;Awareness;Problem solving  Current Attention Level: Sustained  Memory: Decreased short-term memory  Following Commands: Follows one step commands with increased time  Safety/Judgement: Decreased awareness of  deficits  Awareness: Emergent  Problem Solving: Slow processing;Decreased initiation  General Comments: daughter cueing patient to answer questions. pt able to answer simple questions  Blood pressure 137/67, pulse 35, temperature 98.6 F (37 C), temperature source Oral, resp. rate 30, height 5\' 6"  (1.676 m), weight 80.1 kg (176 lb 9.4 oz), SpO2 99.00%.  Physical Exam  Constitutional:  80 year old African American female  HENT:  Head: Normocephalic.  Eyes: EOM are normal.  Neck: Normal range of motion. Neck supple. No thyromegaly present.  Cardiovascular: Normal rate and regular rhythm.  Respiratory: Effort normal and breath sounds normal. No respiratory distress.  GI: Soft. Bowel sounds are normal. She exhibits no distension.  Musculoskeletal:  Rheumatoid changes to both hands and feet, shoulders with some discomfort also  Neurological: She is alert.  Patient was able to provide her name, age and date of birth. She did followed basic commands. Very limited medical historian. Mild left central 7. RUE 4- to 4/5 prox to distal with weakness with grip to RA. LUE 3+ to 4/5.  LLE 2/5 HF, KE, 3- ankles. RLE 3 to 3+/5. No sensory deficits  Skin: Skin is warm and dry.  Psychiatric: She has a normal mood and affect. Her behavior is normal.   Results for orders placed during the hospital encounter of 11/13/13 (from the past 24 hour(s))   GLUCOSE, CAPILLARY Status: None    Collection Time    11/14/13 5:15 PM   Result  Value  Ref Range    Glucose-Capillary  88  70 - 99 mg/dL   GLUCOSE, CAPILLARY Status: Abnormal    Collection Time    11/14/13 9:30 PM   Result  Value  Ref Range    Glucose-Capillary  106 (*)  70 - 99 mg/dL   PROTIME-INR Status: None    Collection Time    11/15/13 3:00 AM   Result  Value  Ref Range    Prothrombin Time  15.1  11.6 - 15.2 seconds    INR  1.22  0.00 - 1.49   GLUCOSE, CAPILLARY Status: None    Collection Time    11/15/13 8:03 AM   Result  Value  Ref Range     Glucose-Capillary  73  70 - 99 mg/dL   GLUCOSE, CAPILLARY Status: Abnormal    Collection Time    11/15/13 12:01 PM   Result  Value  Ref Range    Glucose-Capillary  121 (*)  70 - 99 mg/dL    Ct Head Wo Contrast  11/14/2013 CLINICAL DATA: Altered mental status, recent intracranial hemorrhage after fall EXAM: CT HEAD WITHOUT CONTRAST TECHNIQUE: Contiguous axial images were obtained from the base of the skull through the vertex without intravenous contrast. COMPARISON: CT HEAD W/O CM dated 11/13/2013; CT C SPINE W/O CM dated 11/13/2013; CT HEAD W/O CM dated 10/21/2012 is FINDINGS: Moderate atrophy and low attenuation in the deep white matter consistent with chronic involutional change. Age-related basal ganglia calcification and calcifications in the falx, stable. There is again high attenuation in the right caudate, unchanged at 18 x 10 mm. There is mild mass effect on the frontal horn of the right lateral ventricle, unchanged. There is no hydrocephalus but there is a small volume of hemorrhage layering dependently in both lateral ventricles. No evidence of low attenuation to suggest vascular territory infarct. IMPRESSION: Stable focus of hyperattenuation in the right caudate, again consistent with hemorrhage. Small volume hemorrhage layering dependently in both ventricles. No hydrocephalus. Electronically Signed By: Esperanza Heir M.D. On: 11/14/2013 19:33   Assessment/Plan:  Diagnosis: right caudate ICH  1. Does the need for close, 24 hr/day medical supervision in concert with the patient's rehab needs make it unreasonable for this patient to be served in a less intensive setting? Yes 2. Co-Morbidities requiring supervision/potential complications: RA, htn 3. Due to bladder management, bowel management, safety, skin/wound care, disease management, medication administration, pain management and patient education, does the patient require 24 hr/day rehab nursing? Yes 4. Does the patient require coordinated  care of a physician, rehab nurse, PT (1-2 hrs/day, 5 days/week), OT (1-2 hrs/day, 5 days/week) and SLP (1-2 hrs/day, 5 days/week) to address physical and functional deficits in the context of the above medical diagnosis(es)? Yes Addressing deficits in the following areas: balance, endurance, locomotion, strength, transferring, bowel/bladder control, bathing, dressing, feeding, grooming, toileting, speech, swallowing and psychosocial support 5. Can the patient actively participate in an intensive therapy program of at least 3 hrs of therapy per day at least 5 days per week? Yes 6. The potential for patient to make measurable  gains while on inpatient rehab is excellent 7. Anticipated functional outcomes upon discharge from inpatient rehab are supervision with PT, supervision with OT, supervision with SLP. 8. Estimated rehab length of stay to reach the above functional goals is: 10-14 days 9. Does the patient have adequate social supports to accommodate these discharge functional goals? Yes 10. Anticipated D/C setting: Home 11. Anticipated post D/C treatments: HH therapy and Outpatient therapy 12. Overall Rehab/Functional Prognosis: excellent RECOMMENDATIONS:  This patient's condition is appropriate for continued rehabilitative care in the following setting: CIR  Patient has agreed to participate in recommended program. Yes  Note that insurance prior authorization may be required for reimbursement for recommended care.  Comment: Rehab Admissions Coordinator to follow up. Need to follow up on availability of social supports.  Thanks,  Ranelle Oyster, MD, [place] [facility]  11/15/2013  Revision History...      Date/Time User Action    11/15/2013 3:18 PM Ranelle Oyster, MD Sign    11/15/2013 12:42 PM Charlton Amor, PA-C [place]   View Details Report    Routing History.Marland KitchenMarland Kitchen

## 2013-11-17 NOTE — IPOC Note (Signed)
Overall Plan of Care [facility]) Patient Details Name: Shelly [name] MRN: [id number] DOB: 06/27/1919  Admitting Diagnosis: ICH  Hospital Problems: Active Problems:   ICH (intracerebral hemorrhage)     Functional Problem List: Nursing Bladder;Bowel;Endurance;Medication Management;Pain;Safety  PT Balance;Endurance;Motor;Perception;Safety  OT Balance;Cognition;Endurance;Motor;Perception;Vision  SLP Cognition;Safety  TR         Basic ADL's: OT Eating;Grooming;Bathing;Dressing;Toileting     Advanced  ADL's: OT       Transfers: PT Bed Mobility;Bed to Chair;Car;Furniture  OT Toilet;Tub/Shower     Locomotion: PT Ambulation;Stairs     Additional Impairments: OT Fuctional Use of Upper Extremity  SLP Swallowing      TR      Anticipated Outcomes Item Anticipated Outcome  Self Feeding set up  Swallowing  Mod I   Basic self-care  min A  Toileting  min A   Bathroom Transfers min A  Bowel/Bladder  has bladder accidents,needs to have a BM per pt,has been a few days  Transfers  Supervision basic, car, and furniture  Locomotion  Supervision 100' wtih RW  Communication     Cognition  Min assist/baseline status  Pain  no c/o pain at this time,h/o arthritis in hands,hands w/ some deformity r/t her arthritis  Safety/Judgment  h/o dementia,mild memory deficits,bed alrm set   Therapy Plan: PT Intensity: Minimum of 1-2 x/day ,45 to 90 minutes PT Frequency: 5 out of 7 days PT Duration Estimated Length of Stay: 10-14 days OT Intensity: Minimum of 1-2 x/day, 45 to 90 minutes OT Frequency: 5 out of 7 days OT Duration/Estimated Length of Stay: 20-24 days SLP Intensity: Minumum of 1-2 x/day, 30 to 90 minutes SLP Frequency: 3 out of 7 days SLP Duration/Estimated Length of Stay: 7-10 days for SLP only       Team Interventions: Nursing Interventions Patient/Family Education;Bladder Management;Bowel Management;Pain Management;Medication Management;Cognitive  Remediation/Compensation;Psychosocial Support;Discharge Planning  PT interventions Ambulation/gait training;Balance/vestibular training;Cognitive remediation/compensation;Community reintegration;Discharge planning;Disease management/prevention;DME/adaptive equipment instruction;Functional mobility training;Neuromuscular re-education;Pain management;Patient/family education;Psychosocial support;Skin care/wound management;Splinting/orthotics;Stair training;Therapeutic Activities;Therapeutic Exercise;UE/LE Strength taining/ROM;UE/LE Coordination activities;Visual/perceptual remediation/compensation;Wheelchair propulsion/positioning  OT Interventions Balance/vestibular training;Cognitive remediation/compensation;Discharge planning;DME/adaptive equipment instruction;Functional mobility training;Neuromuscular re-education;Patient/family education;Therapeutic Activities;Therapeutic Exercise;UE/LE Strength taining/ROM;UE/LE Coordination activities;Visual/perceptual remediation/compensation;Self Care/advanced ADL retraining  SLP Interventions Cueing hierarchy;Environmental controls;Dysphagia/aspiration precaution training;Functional tasks;Patient/family education;Therapeutic Exercise;Internal/external aids  TR Interventions    SW/CM Interventions Discharge Planning;Psychosocial Support;Patient/Family Education    Team Discharge Planning: Destination: PT-Home ,OT- Home , SLP-Home Projected Follow-up: PT-Home health PT;24 hour supervision/assistance, OT-  Home health OT, SLP-24 hour supervision/assistance Projected Equipment Needs: PT-None recommended by PT;Other (comment) (Pt likely has all equipment needs), OT- Tub/shower bench, SLP-None recommended by SLP Equipment Details: PT- , OT-  Patient/family involved in discharge planning: PT- Patient;Family member/caregiver,  OT-Patient, SLP-Patient;Family member/caregiver  MD ELOS: 12-14d Medical Rehab Prognosis:  Good Assessment: 80 y.o. right-handed female with  history of bilateral TKR, hypertension, baseline dementia, RA with chronic prednisone, DVT with chronic Coumadin and chronic renal insufficiency baseline creatinine 1.46. Per reports on 11/11/13, patient attempted to turn the light off, and was found face down on the floor but without any injuries. She was placed back in bed but was noted to be less talkative, with altered mental status, increase in urinary incontinence as well as decreased level of responsiveness since the fall. She was admitted on 11/13/13 for workup and Cranial CT scan showed right caudate head ICH. y Dr. Newell Coral advise conservatived care with serial CCT.  Now requiring 24/7 Rehab RN,MD, as well as CIR level PT, OT and SLP.  Treatment team will focus on  ADLs and mobility with goals set at [facility]    See Team Conference Notes for weekly updates to the plan of care

## 2013-11-17 NOTE — Progress Notes (Signed)
PMR Admission Coordinator Pre-Admission Assessment  Patient: Shelly [name] is an 80 y.o., female  MRN: [id number]  DOB: 01/30/1919  Height: 5\' 6"  (167.6 cm)  Weight: 80.1 kg (176 lb 9.4 oz)  Insurance Information  HMO: No PPO: PCP: IPA: 80/20: OTHER:  PRIMARY: Medicare A/B Policy#: A Subscriber: [id number]  CM Name: Phone#: Fax#:  Pre-Cert#: Employer: Retired  Benefits: Phone #: Name: Checked in [place]. Date: 12/04/85 Deduct: $1260 Out of Pocket Max: none Life Max: unlimited  CIR: 100% SNF: 100 days  Outpatient: 80% Co-Pay: 20%  Home Health: 100% Co-Pay: none  DME: 80% Co-Pay: 20%  Providers: patient's choice   SECONDARY: UHC Policy#: 02/03/86 Subscriber: [id number]  CM Name: Phone#: Fax#:  Pre-Cert#: Employer: Retired  Benefits: Phone #: [phone] Name:  Eff. Date: Deduct: Out of Pocket Max: Life Max:  CIR: SNF:  Outpatient: Co-Pay:  Home Health: Co-Pay:  DME: Co-Pay:  Emergency Contact Information  Contact Information    Name  Relation  Home  Work  Kuna  Daughter  [phone]   [phone]    Reaves,Phyllis  Daughter    [phone]      Current Medical History  Patient Admitting Diagnosis: Right caudate ICH  History of Present Illness: A 80 y.o. right-handed female with history of bilateral TKR, hypertension, rheumatoid arthritis with chronic prednisone, DVT with chronic Coumadin and chronic renal insufficiency baseline creatinine 1.46. Patient independent to ambulate household distances with a walker prior to admission. Admitted 11/13/2013 after being found on the floor. By report patient attempted to turn the light off and fell out of bed. Patient with altered mental status decreased level of responsiveness. Cranial CT scan showed right caudate head ICH. Neurosurgery Dr. 01/13/2014 consulted advise conservative care. INR on admission of 1.75 and reversed. Followup cranial CT scan stable without hydrocephalus. MRSA PCR screening  positive maintained on contact precautions. Dysphagia 2 thin liquid diet. Physical and occupational therapy evaluations completed 11/14/2013 with recommendations of physical medicine rehabilitation consult.  Total: 2=NIH  Past Medical History  Past Medical History   Diagnosis  Date   .  Hypertension    .  Bronchitis      "not chronic; not recurrent" (11/13/2013)   .  Chronic anemia    .  GERD (gastroesophageal reflux disease)    .  Pulmonary embolism    .  DVT (deep venous thrombosis)      "RLE?"   .  Rheumatoid arthritis    .  Stroke      11/13/2013 "she has a slow bleed"   .  Chronic kidney disease (CKD), stage II (mild)      Stage II, creatine clearance 40-50 cc /minute    Family History  family history includes Hypertension in her daughter.  Prior Rehab/Hospitalizations: Had HH therapies after previous TKRs.  Current Medications  Current facility-administered medications:0.9 % sodium chloride infusion, , Intravenous, Continuous, 01/13/2014, PA-C, Last Rate: 75 mL/hr at 11/16/13 0446; acetaminophen (TYLENOL) suppository 650 mg, 650 mg, Rectal, Q4H PRN, 11/18/13, PA-C; acetaminophen (TYLENOL) tablet 650 mg, 650 mg, Oral, Q4H PRN, Ulice Dash, PA-C; Chlorhexidine Gluconate Cloth 2 % PADS 6 each, 6 each, Topical, Q0600, Ulice Dash, MD, 6 each at 11/16/13 0446  diltiazem (CARDIZEM) tablet 60 mg, 60 mg, Oral, TID, 11/18/13, PA-C, 60 mg at 11/16/13 0944; HYDROcodone-acetaminophen (NORCO/VICODIN) 5-325 MG per tablet 1 tablet, 1 tablet, Oral, Q6H PRN, 11/18/13, PA-C; labetalol (NORMODYNE,TRANDATE) injection  10-40 mg, 10-40 mg, Intravenous, Q10 min PRN, Ulice Dash, PA-C; mirtazapine (REMERON) tablet 15 mg, 15 mg, Oral, QHS, Ulice Dash, PA-C, 15 mg at 11/14/13 2153  mupirocin ointment (BACTROBAN) 2 % 1 application, 1 application, Nasal, BID, Thana Farr, MD, 1 application at 11/15/13 0906; pantoprazole (PROTONIX) EC tablet 40 mg, 40 mg, Oral, QPC breakfast, Ulice Dash, PA-C, 40 mg at 11/16/13 3825; predniSONE (DELTASONE) tablet 5 mg, 5 mg, Oral, Q breakfast, Ulice Dash, PA-C, 5 mg at 11/16/13 0945  senna-docusate (Senokot-S) tablet 1 tablet, 1 tablet, Oral, BID, Ulice Dash, PA-C, 1 tablet at 11/16/13 0539  Patients Current Diet: Dysphagia  Precautions / Restrictions  Precautions  Precautions: Fall  Restrictions  Weight Bearing Restrictions: No  Prior Activity Level  Limited Community (1-2x/wk): Went out 1-2 X a week to church and to MD appointments  Home Assistive Devices / Equipment  Home Assistive Devices/Equipment: Dentures (specify type);Walker (specify type);Bedside commode/3-in-1;Shower chair with back;Grab bars around toilet;Hand-held shower hose  Home Equipment: Walker - 2 wheels  Prior Functional Level  Prior Function  Level of Independence: Needs assistance  Gait / Transfers Assistance Needed: Amb household distances with walker.  ADL's / Homemaking Assistance Needed: Min A with ADLs  Current Functional Level  Cognition  Overall Cognitive Status: History of cognitive impairments - at baseline  Current Attention Level: Sustained  Orientation Level: Oriented to person;Oriented to place;Oriented to time;Disoriented to situation  Following Commands: Follows one step commands with increased time  Safety/Judgement: Decreased awareness of deficits  General Comments: Patient able to answer simple questions.   Extremity Assessment  (includes Sensation/Coordination)     ADLs  Overall ADL's : Needs assistance/impaired  Grooming: Wash/dry face;Minimal assistance;Sitting  Toilet Transfer: +2 for physical assistance;Moderate assistance;Stand-pivot  Toileting- Clothing Manipulation and Hygiene: Total assistance;+2 for physical assistance;Sit to/from stand  Toileting - Clothing Manipulation Details (indicate cue type and reason): total (A) for peri care  General ADL Comments: Pt verbalized need to void and completed void of bladder/ bowels  on 3n1   Mobility  Overal bed mobility: Needs Assistance  Bed Mobility: Rolling;Sidelying to Sit  Rolling: Min assist  Sidelying to sit: HOB elevated;Mod assist  Supine to sit: Min assist;HOB elevated  Sit to supine: Min assist  General bed mobility comments: Mod-A needed to help slide bed pad to position pt hips during supine to sit transfer. Mod VC provided for hand placement and leg positioning.   Transfers  Overall transfer level: Needs assistance  Equipment used: Rolling walker (2 wheeled);2 person hand held assist  Transfers: Sit to/from Stand  Sit to Stand: +2 physical assistance;Mod assist  General transfer comment: VC to lean forward before beginning to stand and to increase hip extension in standing. Mod-A x2 to maintain hip extension   Ambulation / Gait / Stairs / Wheelchair Mobility  Ambulation/Gait  Ambulation/Gait assistance: Mod assist;+2 physical assistance  Ambulation Distance (Feet): 8 Feet  Assistive device: Rolling walker (2 wheeled)  Gait Pattern/deviations: Step-to pattern;Decreased stride length;Decreased weight shift to left;Trunk flexed  Gait velocity: decreased  Gait velocity interpretation: Below normal speed for age/gender  General Gait Details: Pt required postural facilitation through the hips to maintain upright posture. Pt voided bladder after ambulating about 6 feet.   Posture / Balance    Special needs/care consideration  BiPAP/CPAP No  CPM No  Continuous Drip IV 0.9% NS 75 ml/hr  Dialysis No  Life Vest No  Oxygen No  Special Bed No  Trach  Size No  Wound Vac (area) No  Skin Has wound right calf from blood clot  Bowel mgmt: Had last BM 11/15/13  Bladder mgmt: Uses BSC and is incontinent at times, especially at night  Diabetic mgmt No, per daughter  Contact Isolation: Yes   Previous Home Environment  Living Arrangements: Children  Available Help at Discharge: Family  Type of Home: House  Home Layout: One level  Home Access: Stairs to enter   Entrance Stairs-Rails: Right  Entrance Stairs-Number of Steps: 2  Home Care Services: No  Additional Comments: Pt lives with daughter.  Discharge Living Setting  Plans for Discharge Living Setting: Patient's home;House;Lives with (comment) (Lives with husband and daughter.)  Type of Home at Discharge: House  Discharge Home Layout: One level  Discharge Home Access: Stairs to enter  Entrance Stairs-Number of Steps: 1 at back and 3-4 at front  Does the patient have any problems obtaining your medications?: No  Social/Family/Support Systems  Patient Roles: Spouse;Parent (Has a husband and 4 children.)  Contact Information: Saraya Tirey - daughter (h) [phone] (c) [phone]  Anticipated Caregiver: Daughter [name]  Ability/Limitations of Caregiver: Daughter is caregiver for patient and her dad in their home  Caregiver Availability: 24/7  Discharge Plan Discussed with Primary Caregiver: Yes  Is Caregiver In Agreement with Plan?: Yes  Does Caregiver/Family have Issues with Lodging/Transportation while Pt is in Rehab?: No  Goals/Additional Needs  Patient/Family Goal for Rehab: PT/OT/ST Supervision goals  Expected length of stay: 10-14 days  Cultural Considerations: [employer]  Dietary Needs: Dys 2, thin liquids  Equipment Needs: TBD  Pt/Family Agrees to Admission and willing to participate: Yes  Program Orientation Provided & Reviewed with Pt/Caregiver Including Roles & Responsibilities: Yes  Decrease burden of Care through IP rehab admission: N/A  Possible need for SNF placement upon discharge: Not planned  Patient Condition: This patient's condition remains as documented in the consult dated 11/15/13, in which the Rehabilitation Physician determined and documented that the patient's condition is appropriate for intensive rehabilitative care in an inpatient rehabilitation facility. Will admit to inpatient rehab today.  Preadmission Screen Completed By: [name], 11/16/2013 3:07  PM  ______________________________________________________________________  Discussed status with Dr. Wynn Banker on 11/16/13 at 1514 and received telephone approval for admission today.  Admission Coordinator: [name], time1514/Date05/14/15  Cosigned by: Erick Colace, MD [11/16/2013 3:16 PM]

## 2013-11-17 NOTE — Evaluation (Signed)
Physical Therapy Assessment and Plan  Patient Details  Name: Shelly [name] MRN: [id number] Date of Birth: 1919/05/31  PT Diagnosis: Abnormal posture, Abnormality of gait, Cognitive deficits, Coordination disorder, Hemiparesis non-dominant, Impaired cognition and Muscle weakness Rehab Potential: Good ELOS: 10-14 days   Today's Date: 11/17/2013 Time: 1110-1210 and 13:35-14:00  Time Calculation (min): 60 min and 63mn   Problem List:  Patient Active Problem List   Diagnosis Date Noted  . Intracranial bleed 11/13/2013  . ICH (intracerebral hemorrhage) 11/13/2013  . Other pulmonary embolism and infarction 04/11/2013  . Syncope 10/21/2012  . History of pulmonary embolism 10/21/2012  . Rheumatoid arthritis(714.0) 10/21/2012  . Hypertension 10/21/2012  . Fever 10/21/2012    Past Medical History:  Past Medical History  Diagnosis Date  . Hypertension   . Bronchitis     "not chronic; not recurrent" (11/13/2013)  . Chronic anemia   . GERD (gastroesophageal reflux disease)   . Pulmonary embolism   . DVT (deep venous thrombosis)     "RLE?"  . Rheumatoid arthritis   . Stroke     11/13/2013 "she has a slow bleed"  . Chronic kidney disease (CKD), stage II (mild)     Stage II, creatine clearance 40-50 cc /minute   Past Surgical History:  Past Surgical History  Procedure Laterality Date  . Replacement total knee bilateral Bilateral   . Joint replacement    . Tubal ligation    . Carpal tunnel release    . Dilation and curettage of uterus    . Cataract extraction w/ intraocular lens  implant, bilateral Bilateral     Assessment & Plan Clinical Impression: Shelly KACHMARis a 80y.o. right-handed female with history of bilateral TKR, hypertension, baseline dementia, RA with chronic prednisone, DVT with chronic Coumadin and chronic renal insufficiency baseline creatinine 1.46. Per reports on 11/11/13, patient attempted to turn the light off, and was found face down on the floor but without  any injuries. She was placed back in bed but was noted to be less talkative, with altered mental status, increase in urinary incontinence as well as decreased level of responsiveness since the fall. She was admitted on 11/13/13 for workup and Cranial CT scan showed right caudate head ICH. y Dr. NSherwood Gambleradvise conservatived care with serial CCT. INR on admission of 1.75 and reversed. Followup cranial CT scan stable right caudate hemorrhage without hydrocephalus. Patient with resultant LUE weakness, delayed speech as well as problems processing. CIR recommended by therapy team and patient admitted today.   Patient transferred to CIR on 11/16/2013 .   Patient currently requires mod with mobility secondary to muscle weakness, decreased cardiorespiratoy endurance, impaired timing and sequencing, motor apraxia, decreased coordination and decreased motor planning, decreased visual motor skills, decreased initiation, decreased attention, decreased memory and delayed processing and decreased standing balance, decreased postural control, hemiplegia and decreased balance strategies.  Prior to hospitalization, patient was supervision with mobility and lived with Spouse;Daughter in a House home.  Home access is 2Stairs to enter.  Patient will benefit from skilled PT intervention to maximize safe functional mobility, minimize fall risk and decrease caregiver burden for planned discharge home with 24 hour supervision.  Anticipate patient will benefit from follow up HSalemat discharge.  PT - End of Session Activity Tolerance: Tolerates 10 - 20 min activity with multiple rests Endurance Deficit: Yes Endurance Deficit Description: Pt needed seated rest after 175m standing PT Assessment Rehab Potential: Good Barriers to Discharge: Other (comment) (None) PT Patient demonstrates  impairments in the following area(s): Balance;Endurance;Motor;Perception;Safety PT Transfers Functional Problem(s): Bed Mobility;Bed to  Chair;Car;Furniture PT Locomotion Functional Problem(s): Ambulation;Stairs PT Plan PT Intensity: Minimum of 1-2 x/day ,45 to 90 minutes PT Frequency: 5 out of 7 days PT Duration Estimated Length of Stay: 10-14 days PT Treatment/Interventions: Ambulation/gait training;Balance/vestibular training;Cognitive remediation/compensation;Community reintegration;Discharge planning;Disease management/prevention;DME/adaptive equipment instruction;Functional mobility training;Neuromuscular re-education;Pain management;Patient/family education;Psychosocial support;Skin care/wound management;Splinting/orthotics;Stair training;Therapeutic Activities;Therapeutic Exercise;UE/LE Strength taining/ROM;UE/LE Coordination activities;Visual/perceptual remediation/compensation;Wheelchair propulsion/positioning PT Transfers Anticipated Outcome(s): Supervision basic, car, and furniture PT Locomotion Anticipated Outcome(s): Supervision 100' wtih RW PT Recommendation Follow Up Recommendations: Home health PT;24 hour supervision/assistance Patient destination: Home Equipment Recommended: None recommended by PT;Other (comment) (Pt likely has all equipment needs)  Skilled Therapeutic Intervention First tx focused on functional mobility. Pt and daughter oriented to PT POC and goals. Pt educated on transfers with verbal cues and manual facilitation for anterior translation over BOS to reduce posterior lean during transitional movements. Pt having difficult time this morning following instructions for mobility, needing increased processing time. Pt able to perform static standing x10 seconds only due to fatigue.  Pt left up in [facility] with daughter for lunch, all needs in reach.   Second tx focused on gait with RW and standing tolerance and transfers. Pt much more rested and alert this afternoon with improved command following.   Gait training with RW x45' with min A only. Gait marked by decreased step length and foot clearance as well  as forward flexed posture, which pt is largely unable to adjust with cues and manual facilitation. Pt stopped two times, needing cues to continue task.   Performed serial sit<>stand transfers with mod lifting assist to high table x3 with cues for technique. Pt able to stand 2x56mn with 1 UE support during rainbow ring task for. Seated rests needed between tasks.   [facility] encouragement to lay down to rest.   PT Evaluation Precautions/Restrictions Precautions Precautions: Fall Precaution Comments: Generalzied we Restrictions Weight Bearing Restrictions: No General   Vital Signs  Pain Pain Assessment Pain Assessment: No/denies pain Home Living/Prior Functioning Home Living Living Arrangements: Spouse/significant other;Children Available Help at Discharge: Family;Available 24 hours/day Type of Home: House Home Access: Stairs to enter ECenterPoint Energyof Steps: 2 Entrance Stairs-Rails: Right Home Layout: One level Additional Comments: Pt lives with daughter and pt's spouse, who are both home all day  Lives With: Spouse;Daughter Prior Function Level of Independence: Needs assistance with ADLs;Requires assistive device for independence  Able to Take Stairs?: Yes Driving: No 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 Leisure: Hobbies-yes (Comment) Comments: Enjoys talking on the phone and going to church Vision/Perception  Vision - History Baseline Vision: Wears glasses only for reading Visual History: Glaucoma (Removed) Patient Visual Report: No change from baseline Vision - Assessment Eye Alignment: Within Functional Limits Vision Assessment: Vision tested Tracking/Visual Pursuits: Decreased smoothness of horizontal tracking;Decreased smoothness of vertical tracking;Decreased smoothness of eye movement to RIGHT superior field;Decreased smoothness of eye movement to RIGHT inferior field Visual Fields: Impaired - to be  further tested in functional context;Other (comment) (unable to ID target for visual field,  cognitive? ) Additional Comments: Pt was not able to follow directions for any visual testing, but was able to locate items on her tray. Perception Perception: Impaired Inattention/Neglect: Does not attend to left side of body Praxis Praxis: Impaired Praxis Impairment Details: Initiation;Motor planning Praxis-Other Comments: Pt needing verbal and tactile cues for initation and completing new tasks. Unable to follow verbal and  demo cues for heel-shin slides  Cognition Overall Cognitive Status: Impaired/Different from baseline Arousal/Alertness: Lethargic Orientation Level: Oriented to person;Disoriented to situation Attention: Alternating Alternating Attention: Impaired Alternating Attention Impairment: Verbal basic;Functional basic Memory: Impaired Memory Impairment: Decreased recall of new information;Decreased short term memory Decreased Short Term Memory: Verbal basic Awareness: Impaired Awareness Impairment: Intellectual impairment Problem Solving: Impaired Problem Solving Impairment: Verbal basic;Functional basic Executive Function: Self Monitoring;Self Correcting (likely baseline) Self Monitoring: Impaired Self Monitoring Impairment: Functional basic Self Correcting: Impaired Self Correcting Impairment: Functional basic Safety/Judgment: Impaired Comments: Pt likely close to cognitive baseline, but with delayed processing, sustained attention, and command following Sensation Sensation Light Touch: Appears Intact Stereognosis: Not tested Hot/Cold: Appears Intact Proprioception: Impaired by gross assessment Coordination Gross Motor Movements are Fluid and Coordinated: No Fine Motor Movements are Fluid and Coordinated: No Coordination and Movement Description: Pt has arthritic fingers that inhibit [place] and decreased accuracy and excursion wtih LE motions Heel Shin Test: Pt unable to  complete with instruction despite sufficient strength Motor  Motor Motor: Motor apraxia;Motor impersistence Motor - Skilled Clinical Observations: Posterior lean noted initially upon standing, decreased righting reactions  Mobility Transfers Transfers: Yes Stand Pivot Transfers: 3: Mod assist Stand Pivot Transfer Details (indicate cue type and reason): Pt needed multi-modal cues for initiation and safety as well as  lifting assist.  Locomotion  Ambulation Ambulation: Yes Ambulation/Gait Assistance: 4: Min assist Ambulation Distance (Feet): 12 Feet Assistive device: Rolling walker Ambulation/Gait Assistance Details: Pt needed steadying assist. Gait marked by decreased step length, forward flexed posture, and decreased hip/knee ext in stance.  Stairs / Additional Locomotion Stairs: Yes Stairs Assistance: 2: Max Armed forces technical officer Details (indicate cue type and reason): Pt needed heavy lifting assist, able to place LLE up, but unable to pull self up with bil UE support.  Stair Management Technique: Two rails;Step to pattern Number of Stairs: 1 Height of Stairs: 6 Wheelchair Mobility Wheelchair Mobility: Yes Wheelchair Assistance: 4: Energy manager: Both upper extremities Wheelchair Parts Management: Needs assistance Distance: 20  Trunk/Postural Assessment  Cervical Assessment Cervical Assessment: Within Functional Limits Thoracic Assessment Thoracic Assessment: Within Functional Limits Lumbar Assessment Lumbar Assessment: Exceptions to [facility] (Posterior pelvic tilt) Postural Control Postural Control: Deficits on evaluation Trunk Control: Posterior lean in standing Righting Reactions: Delayed and insufficient  Balance Balance Balance Assessed: Yes Static Sitting Balance Static Sitting - Level of Assistance: 5: Stand by assistance Dynamic Sitting Balance Dynamic Sitting - Level of Assistance: 4: Min Insurance risk surveyor Standing -  Balance Support: No upper extremity supported Static Standing - Level of Assistance: 3: Mod assist Static Standing - Comment/# of Minutes: Pt only able to stand without UE support for 2-3 seconds, constantly trying to find UE support Dynamic Standing Balance Dynamic Standing - Level of Assistance: Not tested (comment) Extremity Assessment  RUE Assessment RUE Assessment: Exceptions to [facility] ((AROM WFL, strength 3+/5)) LUE Assessment LUE Assessment: Exceptions to [facility] ((AROM WFL, strength 3+/5)) RLE Assessment RLE Assessment: Exceptions to [facility] (Grossly 3+/5 throughout, heel cord tightness noted) LLE Assessment LLE Assessment: Exceptions to [facility] (Grossly 3+/5 throughout, heel cord tightness noted)  FIM:  FIM - Control and instrumentation engineer Devices: Walker;Arm rests Bed/Chair Transfer: 3: Bed > Chair or W/C: Mod A (lift or lower assist);3: Chair or W/C > Bed: Mod A (lift or lower assist) FIM - Locomotion: Wheelchair Distance: 20 Locomotion: Wheelchair: 1: Travels less than 50 ft with minimal assistance (Pt.>75%) FIM - Locomotion: Ambulation Locomotion: Ambulation Assistive Devices: Environmental consultant -  Rolling Ambulation/Gait Assistance: 4: Min assist Locomotion: Ambulation: 1: Travels less than 50 ft with minimal assistance (Pt.>75%) FIM - Locomotion: Stairs Locomotion: Scientist, physiological: Hand rail - 2 Locomotion: Stairs: 1: Up and Down < 4 stairs with maximal assistance (Pt: 25 - 49%)   Refer to Care Plan for Long Term Goals  Recommendations for other services: None  Discharge Criteria: Patient will be discharged from PT if patient refuses treatment 3 consecutive times without medical reason, if treatment goals not met, if there is a change in medical status, if patient makes no progress towards goals or if patient is discharged from hospital.  The above assessment, treatment plan, treatment alternatives and goals were discussed and mutually agreed upon: by patient  Kennieth Rad, PT, DPT  11/17/2013, 12:35 PM

## 2013-11-17 NOTE — Progress Notes (Signed)
Subjective/Complaints: 80 y.o. right-handed female with history of bilateral TKR, hypertension, baseline dementia, RA with chronic prednisone, DVT with chronic Coumadin and chronic renal insufficiency baseline creatinine 1.46. Per reports on 11/11/13, patient attempted to turn the light off, and was found face down on the floor but without any injuries. She was placed back in bed but was noted to be less talkative, with altered mental status, increase in urinary incontinence as well as decreased level of responsiveness since the fall. She was admitted on 11/13/13 for workup and Cranial CT scan showed right caudate head ICH.  Pt without pain c/o denies dizziness  ROS limited by cognitive status Objective: Vital Signs: Blood pressure 126/52, pulse 40, temperature 97.3 F (36.3 C), temperature source Oral, resp. rate 18, height '5\' 1"'  (1.549 m), weight 69.1 kg (152 lb 5.4 oz), SpO2 96.00%. No results found. Results for orders placed during the hospital encounter of 11/16/13 (from the past 72 hour(s))  GLUCOSE, CAPILLARY     Status: None   Collection Time    11/16/13  8:45 PM      Result Value Ref Range   Glucose-Capillary 93  70 - 99 mg/dL  CBC WITH DIFFERENTIAL     Status: Abnormal   Collection Time    11/17/13  5:32 AM      Result Value Ref Range   WBC 6.2  4.0 - 10.5 K/uL   RBC 3.90  3.87 - 5.11 MIL/uL   Hemoglobin 9.1 (*) 12.0 - 15.0 g/dL   HCT 27.5 (*) 36.0 - 46.0 %   MCV 70.5 (*) 78.0 - 100.0 fL   MCH 23.3 (*) 26.0 - 34.0 pg   MCHC 33.1  30.0 - 36.0 g/dL   RDW 16.1 (*) 11.5 - 15.5 %   Platelets 206  150 - 400 K/uL   Neutrophils Relative % 50  43 - 77 %   Neutro Abs 3.2  1.7 - 7.7 K/uL   Lymphocytes Relative 34  12 - 46 %   Lymphs Abs 2.1  0.7 - 4.0 K/uL   Monocytes Relative 13 (*) 3 - 12 %   Monocytes Absolute 0.8  0.1 - 1.0 K/uL   Eosinophils Relative 3  0 - 5 %   Eosinophils Absolute 0.2  0.0 - 0.7 K/uL   Basophils Relative 0  0 - 1 %   Basophils Absolute 0.0  0.0 - 0.1  K/uL  COMPREHENSIVE METABOLIC PANEL     Status: Abnormal   Collection Time    11/17/13  5:32 AM      Result Value Ref Range   Sodium 139  137 - 147 mEq/L   Potassium 3.3 (*) 3.7 - 5.3 mEq/L   Chloride 105  96 - 112 mEq/L   CO2 22  19 - 32 mEq/L   Glucose, Bld 84  70 - 99 mg/dL   BUN 8  6 - 23 mg/dL   Creatinine, Ser 1.08  0.50 - 1.10 mg/dL   Calcium 8.8  8.4 - 10.5 mg/dL   Total Protein 6.5  6.0 - 8.3 g/dL   Albumin 2.6 (*) 3.5 - 5.2 g/dL   AST 16  0 - 37 U/L   ALT 8  0 - 35 U/L   Alkaline Phosphatase 31 (*) 39 - 117 U/L   Total Bilirubin 0.6  0.3 - 1.2 mg/dL   GFR calc non Af Amer 43 (*) >90 mL/min   GFR calc Af Amer 50 (*) >90 mL/min   Comment: (NOTE)  The eGFR has been calculated using the CKD EPI equation.     This calculation has not been validated in all clinical situations.     eGFR's persistently <90 mL/min signify possible Chronic Kidney     Disease.  GLUCOSE, CAPILLARY     Status: None   Collection Time    11/17/13  7:02 AM      Result Value Ref Range   Glucose-Capillary 81  70 - 99 mg/dL   Comment 1 Notify RN        HENT:  Head: Normocephalic and atraumatic.  Edentulous  Eyes: Pupils are equal, round, and reactive to light.  Neck: Normal range of motion. Neck supple.  Cardiovascular: Normal rate and regular rhythm.  Respiratory: Effort normal and breath sounds normal.  GI: Soft. Bowel sounds are normal. She exhibits no distension. There is no tenderness.  Musculoskeletal: She exhibits edema (pedal edema ).  RA changes bilateral hands and feet. Well healed old B-TKR and hammer toe repair incisions.  Neurological: She is alert.  Oriented to self and place. Able to follow simple one step commands.  Mild left central 7. RUE 4- to 4/5 prox to distal with weakness with grip to RA. LUE 3+ to 4/5. LLE 3-/5 HF, KE, 3- ankles. RLE 3 to 3+/5. No sensory deficits  Skin: Skin is warm and dry.  Slowed responses, oriented to person and place  Needs verbal and physical  cues for manual muscle testing with repeated instructions   Assessment/Plan: 1. Functional deficits secondary to Right caudate ICH which require 3+ hours per day of interdisciplinary therapy in a comprehensive inpatient rehab setting. Physiatrist is providing close team supervision and 24 hour management of active medical problems listed below. Physiatrist and rehab team continue to assess barriers to discharge/monitor patient progress toward functional and medical goals. FIM:                   Comprehension Comprehension Mode: Auditory Comprehension: 5-Follows basic conversation/direction: With extra time/assistive device  Expression Expression Mode: Verbal Expression: 5-Expresses basic 90% of the time/requires cueing < 10% of the time.  Social Interaction Social Interaction: 5-Interacts appropriately 90% of the time - Needs monitoring or encouragement for participation or interaction.  Problem Solving Problem Solving: 4-Solves basic 75 - 89% of the time/requires cueing 10 - 24% of the time  Memory Memory: 4-Recognizes or recalls 75 - 89% of the time/requires cueing 10 - 24% of the time  Medical Problem List and Plan:  1. Functional deficits secondary to Right caudate ICH  2. PE/ H/o DVT/Anticoagulation: Mechanical: Antiembolism stockings, knee (TED hose) Bilateral lower extremities  Sequential compression devices, below knee Bilateral lower extremities  3. Pain Management: Will continue Vicodin every 6 hours prn  4. Mood: Continue Remeron at bedtime. LCSW to follow for evaluation and support.  5. Mild dementia/Neuropsych: Did not tolerate Aricept trial due to SE. This patient is not capable of making decisions on her own behalf.  6. HTN: Will monitor BP every 8 hours. Continue cardizem tid. But reduce to 49m due to bradycardia 7. CKD: Baseline Cr-1.4 at admission. Improved to 1.08, but K is low will supplement 8. RA: Will continue chronic steroids.    LOS (Days)  1 A FACE TO FACE EVALUATION WAS PERFORMED  ACharlett Blake5/15/2015, 7:52 AM

## 2013-11-17 NOTE — Evaluation (Signed)
Occupational Therapy Assessment and Plan  Patient Details  Name: Shelly [name] MRN: [id number] Date of Birth: [birth year]  OT Diagnosis: abnormal posture, apraxia, cognitive deficits, hemiplegia affecting non-dominant side and muscle weakness (generalized) Rehab Potential: Rehab Potential: Fair ELOS: 20-24 days   Today's Date: 11/17/2013 Time: 0800-0900 Time Calculation (min): 60 min  Problem List:  Patient Active Problem List   Diagnosis Date Noted  . Intracranial bleed 11/13/2013  . ICH (intracerebral hemorrhage) 11/13/2013  . Other pulmonary embolism and infarction 04/11/2013  . Syncope 10/21/2012  . History of pulmonary embolism 10/21/2012  . Rheumatoid arthritis(714.0) 10/21/2012  . Hypertension 10/21/2012  . Fever 10/21/2012    Past Medical History:  Past Medical History  Diagnosis Date  . Hypertension   . Bronchitis     "not chronic; not recurrent" (11/13/2013)  . Chronic anemia   . GERD (gastroesophageal reflux disease)   . Pulmonary embolism   . DVT (deep venous thrombosis)     "RLE?"  . Rheumatoid arthritis   . Stroke     11/13/2013 "she has a slow bleed"  . Chronic kidney disease (CKD), stage II (mild)     Stage II, creatine clearance 40-50 cc /minute   Past Surgical History:  Past Surgical History  Procedure Laterality Date  . Replacement total knee bilateral Bilateral   . Joint replacement    . Tubal ligation    . Carpal tunnel release    . Dilation and curettage of uterus    . Cataract extraction w/ intraocular lens  implant, bilateral Bilateral     Assessment & Plan Clinical Impression:Shelly [name] is a 80 y.o. right-handed female with history of bilateral TKR, hypertension, baseline dementia, RA with chronic prednisone, DVT with chronic Coumadin and chronic renal insufficiency baseline creatinine 1.46. Per reports on 11/11/13, patient attempted to turn the light off, and was found face down on the floor but without any injuries. She was placed back  in bed but was noted to be less talkative, with altered mental status, increase in urinary incontinence as well as decreased level of responsiveness since the fall. She was admitted on 11/13/13 for workup and Cranial CT scan showed right caudate head ICH. y Dr. Sherwood Gambler advise conservatived care with serial CCT. INR on admission of 1.75 and reversed. Followup cranial CT scan stable right caudate hemorrhage without hydrocephalus. Patient with resultant LUE weakness, delayed speech as well as problems processing. CIR recommended by therapy team and patient admitted today.     Patient transferred to CIR on 11/16/2013 .    Patient currently requires total with basic self-care skills secondary to muscle weakness, decreased cardiorespiratoy endurance, unbalanced muscle activation, motor apraxia and decreased coordination, decreased visual perceptual skills, decreased attention to left, decreased initiation, decreased attention, decreased awareness, decreased problem solving, decreased safety awareness, decreased memory and delayed processing and decreased sitting balance, decreased standing balance, decreased postural control and hemiplegia.  Prior to hospitalization, patient could complete basic self care with min.  Patient will benefit from skilled intervention to increase independence with basic self-care skills prior to discharge home with care partner.  Anticipate patient will require minimal physical assistance and follow up home health.  OT - End of Session Activity Tolerance: Tolerates 10 - 20 min activity with multiple rests OT Assessment Rehab Potential: [place] OT Patient demonstrates impairments in the following area(s): Balance;Cognition;Endurance;Motor;Perception;Vision OT Basic ADL's Functional Problem(s): Eating;Grooming;Bathing;Dressing;Toileting OT Transfers Functional Problem(s): Toilet;Tub/Shower OT Additional Impairment(s): Fuctional Use of Upper Extremity OT Plan OT Intensity: Minimum of  1-2 x/day, 45 to 90 minutes OT Frequency: 5 out of 7 days OT Duration/Estimated Length of Stay: 20-24 days OT Treatment/Interventions: Balance/vestibular training;Cognitive remediation/compensation;Discharge planning;DME/adaptive equipment instruction;Functional mobility training;Neuromuscular re-education;Patient/family education;Therapeutic Activities;Therapeutic Exercise;UE/LE Strength taining/ROM;UE/LE Coordination activities;Visual/perceptual remediation/compensation;Self Care/advanced ADL retraining OT Self Feeding Anticipated Outcome(s): set up OT Basic Self-Care Anticipated Outcome(s): min A OT Toileting Anticipated Outcome(s): min A OT Bathroom Transfers Anticipated Outcome(s): min A OT Recommendation Patient destination: Home Follow Up Recommendations: Home health OT Equipment Recommended: Tub/shower bench   Skilled Therapeutic Intervention Pt seen for initial evaluation and ADL retraining of toileting, toilet transfers, dressing with a focus on trunk control and balance. Pt is very fearful of falling with strong posterior lean, needed total to max A with transfers and LB self care.  Pt bathed and did not have clothing to wear. Pt provided with hospital gown,  Positioned in chair with QRB and call light in reach.  OT Evaluation Precautions/Restrictions  Precautions Precautions: Fall Restrictions Weight Bearing Restrictions: No General Chart Reviewed: Yes Family/Caregiver Present: No    Pain Pain Assessment Pain Assessment: No/denies pain Home Living/Prior Functioning Home Living Living Arrangements: Spouse/significant other;Children Available Help at Discharge: Family Type of Home: House Home Access: Stairs to enter Home Layout: One level Additional Comments: Pt lives with daughter.   Lives With: Spouse;Daughter Prior Function Level of Independence: Needs assistance with ADLs Vocation: Retired Comments: Family not present during initial evaluation to get accurate  history of PLOF and home set up. ADL ADL ADL Comments: Refer to FIM Vision/Perception  Vision- History Baseline Vision/History: Cataracts Patient Visual Report: No change from baseline Vision- Assessment Vision Assessment?: Vision impaired- to be further tested in functional context Additional Comments: Pt was not able to follow directions for any visual testing, but was able to locate items on her tray.  Cognition Overall Cognitive Status: Impaired/Different from baseline (history of cognitve impairments, may be at baseline.) Orientation Level: Oriented to person;Disoriented to situation Attention: Alternating Alternating Attention: Impaired Alternating Attention Impairment: Verbal basic;Functional basic Memory: Impaired Memory Impairment: Decreased recall of new information;Decreased short term memory Decreased Short Term Memory: Verbal basic Awareness: Impaired Awareness Impairment: Intellectual impairment Problem Solving: Impaired Problem Solving Impairment: Verbal basic;Functional basic Executive Function: Self Monitoring;Self Correcting (likely baseline) Self Monitoring: Impaired Self Monitoring Impairment: Functional basic Self Correcting: Impaired Self Correcting Impairment: Functional basic Safety/Judgment: Impaired Comments: pt's current level of function is likely close to cognitive baseline Sensation Sensation Light Touch: Appears Intact Stereognosis: Appears Intact Hot/Cold: Appears Intact Proprioception: Impaired by gross assessment Coordination Gross Motor Movements are Fluid and Coordinated: No Fine Motor Movements are Fluid and Coordinated: No Coordination and Movement Description: Pt has arthritic fingers that inhibit FMC. Motor  Motor Motor: Motor apraxia;Motor perseverations Motor - Skilled Clinical Observations: strong posteror lean and fear of moving to the left Mobility    max A with transfers and total with static standing Trunk/Postural  Assessment  Cervical Assessment Cervical Assessment: Within Functional Limits Thoracic Assessment Thoracic Assessment: Within Functional Limits Lumbar Assessment Lumbar Assessment:  (posterior pelvic tilt) Postural Control Postural Control: Deficits on evaluation Trunk Control: strong posteror lean and fear of moving to the left  Balance Static Sitting Balance Static Sitting - Level of Assistance: 5: Stand by assistance Dynamic Sitting Balance Dynamic Sitting - Level of Assistance: 4: Min assist Static Standing Balance Static Standing - Level of Assistance: 1: +1 Total assist Dynamic Standing Balance Dynamic Standing - Level of Assistance: Not tested (comment) Extremity/Trunk Assessment RUE Assessment RUE Assessment: Exceptions to [facility]  LUE Assessment LUE Assessment: Exceptions to [facility]  FIM:  FIM - Eating Eating Activity: 5: Needs verbal cues/supervision FIM - Grooming Grooming Steps: Wash, rinse, dry face Grooming: 3: Patient completes 2 of 4 or 3 of 5 steps FIM - Bathing Bathing Steps Patient Completed: Chest;Left Arm;Front perineal area;Right upper leg;Left upper leg Bathing: 3: Mod-Patient completes 5-7 [id number] parts or 50-74% FIM - Upper Body Dressing/Undressing Upper body dressing/undressing: 0: Wears gown/pajamas-no public clothing FIM - Lower Body Dressing/Undressing Lower body dressing/undressing: 0: Wears gInterior and spatial designerFIM - Toileting Toileting: 1: Total-Patient completed zero steps, helper did all 3 FIM - Bed/Chair Transfer Bed/Chair Transfer: 2: Supine > Sit: Max A (lifting assist/Pt. 25-49%);1: Bed > Chair or W/C: Total A (helper does all/Pt. < 25%) FIM - TRadio producerDevices: Grab bars Toilet Transfers: 2-To toilet/BSC: Max A (lift and lower assist);3-From toilet/BSC: Mod A (lift or lower assist) FIM - Tub/Shower Transfers Tub/shower Transfers: 0-Activity did not occur or was simulated   Refer to Care Plan for  Long Term Goals  Recommendations for other services: None  Discharge Criteria: Patient will be discharged from OT if patient refuses treatment 3 consecutive times without medical reason, if treatment goals not met, if there is a change in medical status, if patient makes no progress towards goals or if patient is discharged from hospital.  The above assessment, treatment plan, treatment alternatives and goals were discussed and mutually agreed upon: by patient  JHarlene Ramus5/15/2015, 11:51 AM

## 2013-11-17 NOTE — Progress Notes (Signed)
Social Work Assessment and Plan Social Work Assessment and Plan  Patient Details  Name: Shelly [name] MRN: [id number] Date of Birth: [birth year]  Today's Date: 11/17/2013  Problem List:  Patient Active Problem List   Diagnosis Date Noted  . Intracranial bleed 11/13/2013  . ICH (intracerebral hemorrhage) 11/13/2013  . Other pulmonary embolism and infarction 04/11/2013  . Syncope 10/21/2012  . History of pulmonary embolism 10/21/2012  . Rheumatoid arthritis(714.0) 10/21/2012  . Hypertension 10/21/2012  . Fever 10/21/2012   Past Medical History:  Past Medical History  Diagnosis Date  . Hypertension   . Bronchitis     "not chronic; not recurrent" (11/13/2013)  . Chronic anemia   . GERD (gastroesophageal reflux disease)   . Pulmonary embolism   . DVT (deep venous thrombosis)     "RLE?"  . Rheumatoid arthritis   . Stroke     11/13/2013 "she has a slow bleed"  . Chronic kidney disease (CKD), stage II (mild)     Stage II, creatine clearance 40-50 cc /minute   Past Surgical History:  Past Surgical History  Procedure Laterality Date  . Replacement total knee bilateral Bilateral   . Joint replacement    . Tubal ligation    . Carpal tunnel release    . Dilation and curettage of uterus    . Cataract extraction w/ intraocular lens  implant, bilateral Bilateral    Social History:  reports that she has never smoked. She has never used smokeless tobacco. She reports that she does not drink alcohol or use illicit drugs.  Family / Support Systems Marital Status: Married Patient Roles: Spouse;Parent Spouse/Significant Other: Husband in home Children: [name]  [phone]  [phone]-cell Other Supports: [name]-daughter [phone]-cell Anticipated Caregiver: [name] lives with parents, other tow children are in and out Ability/Limitations of Caregiver: [name] is in good health and able to provide assist Caregiver Availability: 24/7 Family Dynamics: Close knit family who take care  of one another.  All three children are involved and assist with parents care.  They will all be here at one time or another to assist Mom and provide support.  They are very committed to one another.  Social History Preferred language: English Religion: [employer] Cultural Background: No issues Education: [employer] Read: Yes Write: Yes Employment Status: Retired Fish farm manager Issues: No issues Guardian/Conservator: None-according to MD pt is not capable of making her own decisions while here.  Will look toward their children since daughter reports no formal POA for Mom.  Daughter reports someone is usually here with Mom.   Abuse/Neglect Physical Abuse: Denies Verbal Abuse: Denies Sexual Abuse: Denies Exploitation of patient/patient's resources: Denies Self-Neglect: Denies  Emotional Status Pt's affect, behavior adn adjustment status: Pt is tired from begin woken up early.  Daughter reports Mom called her last night at 12;30 am and scared her.  Pt reports she ahd no idea what time it was.  She wants to get back to her prior functioning where she can move around on her own.  Daughter reports she can walk with her walker and can bathe and dress hersel. Recent Psychosocial Issues: Other medical issues but was managing only needed assist with her showers, someone is always there with she and husband at home. Pyschiatric History: No history-deferred depression screen due to her dementia and beign tired from therapies this am.  Daughter voiced she is doing better than she was.  Will monitor her coping while here and allow her to become familiar with staff and  new unit. Substance Abuse History: No issues  Patient / Family Perceptions, Expectations & Goals Pt/Family understanding of illness & functional limitations: Pt does not know reason she is here.  Her daughter has a good understanding and has spoken with MD and feels she has a good understanding of the treatment plan and  rehab.  The children will be here often and particiapte in therapies with pt. Premorbid pt/family roles/activities: Wife, Mother, Grandmother, Church member, Retiree, etc Anticipated changes in roles/activities/participation: resume Pt/family expectations/goals: Pt states: " I want to sleep better."  Daughter states: " We hope she does well and can ambulate when she leaves here."  [employer]: None Premorbid Home Care/DME Agencies: Other (Comment) (Had in past) Transportation available at discharge: family members provide Resource referrals recommended: Support group (specify) ([facility] Support group)  Discharge Planning Living Arrangements: Spouse/significant other;Children Support Systems: Spouse/significant other;Children;Other relatives;Friends/neighbors;Church/faith community Type of Residence: Private residence Insurance Resources: Administrator (specify) Education officer, museum) Financial Resources: Social Security Financial Screen Referred: No Living Expenses: Own Money Management: Family Does the patient have any problems obtaining your medications?: No Home Management: Daughter's do home management Patient/Family Preliminary Plans: Return home with husband and daughter 's assisting.  Main one is [name] the one who lives with parents.  She is retired and available to assist.  She plans to be here to assist and participate in therapies with Mom.  Family was providing 24 hr supervision prior to admission for she and husband. Social Work Anticipated Follow Up Needs: HH/OP;Support Group  Clinical Impression Pleasantly confused patient, who was perserevating on being woke up early today.  Wants to do well here and return home quickly.  Daughter was helpful in filling in the blanks of pt's prior Functioning.  Committed family who have always taken care of their parents.  Aware they will need 24 hour care and plan to provide this.  Will assist with discharge plans and  provide support.  [name] [name] 11/17/2013, 11:43 AM

## 2013-11-17 NOTE — Progress Notes (Signed)
Patient information reviewed and entered into eRehab system by Saanya Zieske, RN, CRRN, PPS Coordinator.  Information including medical coding and functional independence measure will be reviewed and updated through discharge.     Per nursing patient was given "Data Collection Information Summary for Patients in Inpatient Rehabilitation Facilities with attached "Privacy Act Statement-Health Care Records" upon admission.  

## 2013-11-18 ENCOUNTER — Inpatient Hospital Stay (HOSPITAL_COMMUNITY): Payer: Medicare Other | Admitting: Occupational Therapy

## 2013-11-18 ENCOUNTER — Inpatient Hospital Stay (HOSPITAL_COMMUNITY): Payer: Medicare Other | Admitting: Physical Therapy

## 2013-11-18 ENCOUNTER — Inpatient Hospital Stay (HOSPITAL_COMMUNITY): Payer: Medicare Other | Admitting: Speech Pathology

## 2013-11-18 DIAGNOSIS — I619 Nontraumatic intracerebral hemorrhage, unspecified: Secondary | ICD-10-CM

## 2013-11-18 LAB — GLUCOSE, CAPILLARY
Glucose-Capillary: 76 mg/dL (ref 70–99)
Glucose-Capillary: 110 mg/dL — ABNORMAL HIGH (ref 70–99)
Glucose-Capillary: 145 mg/dL — ABNORMAL HIGH (ref 70–99)
Glucose-Capillary: 139 mg/dL — ABNORMAL HIGH (ref 70–99)

## 2013-11-18 MED ORDER — ENSURE COMPLETE PO LIQD
237.0000 mL | ORAL | Status: DC
  Administered 2013-11-18 – 2013-11-27 (×9): 237 mL via ORAL

## 2013-11-18 MED ORDER — ENSURE PUDDING PO PUDG: 1.0000 | ORAL | Status: DC | PRN

## 2013-11-18 NOTE — Progress Notes (Signed)
Physical Therapy Session Note  Patient Details  Name: Shelly [name] MRN: [id number] Date of Birth: 06/19/1919  Today's Date: 11/18/2013 Time: 0263-7858 Time Calculation (min): 34 min  Short Term Goals: Week 1:  PT Short Term Goal 1 (Week 1): STG=LTG due to LOS, Supervision overall for transfers and gait, Min A 2 stairs 1 rail  Skilled Therapeutic Interventions/Progress Updates:   Pt resting in bed; pt cued to come to EOB.  Pt required min-mod A to transition supine > sit with bed rail.  Assisted pt with donning robe secondary to feeling cold.  Performed sit> stand from bed and stand pivot to w/c with RW and mod A with lifting assistance required to come to full stand and mod A needed to safely pivot RW and manual facilitation of lateral weight shifting to pivot feet.  Transitioned to gym in w/c total A. Pt set up in // bars for increased UE support in order to work on lateral weight shifting and foot clearance during foot taps to 4" step.  Pt stood with mod A and was able to elevated and tap step with RLE but when attempting to lift LLE pt reporting that her R knee was going to buckle and reported significant pain in R hip.  Transitioned to holding one rail with bilat UE to perform lateral stepping to continue to work on lateral weight shifting but pt extremely anxious and fearful of WB through RLE and performed pivot on floor to return to sitting in w/c.  Returned to room and RN alerted of R hip pain and pain meds administered by RN.  Described pain to pt daughter who reports her mother has never c/o R hip pain.  Will continue to monitor.  OT alerted of pain.    Therapy Documentation Precautions:  Precautions Precautions: Fall Precaution Comments: Generalzied we Restrictions Weight Bearing Restrictions: No Vital Signs: Therapy Vitals Pulse Rate: 75 BP: 145/67 mmHg Patient Position (if appropriate): Sitting Oxygen Therapy SpO2: 100 % O2 Device: None (Room air) Pain: Pain  Assessment Pain Assessment: No/denies pain at rest but reporting significant R hip pain during WB/standing Pain Type: Acute pain Pain Location: Hip Pain Orientation: Right Pain Onset: With Activity Pain Intervention(s): RN alerted; Medication (See eMAR) Locomotion : Ambulation Ambulation/Gait Assistance: 3: Mod assist    See FIM for current functional status  Therapy/Group: Individual Therapy  Shelly [name] 11/18/2013, 11:50 AM

## 2013-11-18 NOTE — Progress Notes (Signed)
Subjective/Complaints: 80 y.o. right-handed female with history of bilateral TKR, hypertension, baseline dementia, RA with chronic prednisone, DVT with chronic Coumadin and chronic renal insufficiency baseline creatinine 1.46. Per reports on 11/11/13, patient attempted to turn the light off, and was found face down on the floor but without any injuries. She was placed back in bed but was noted to be less talkative, with altered mental status, increase in urinary incontinence as well as decreased level of responsiveness since the fall. She was admitted on 11/13/13 for workup and Cranial CT scan showed right caudate head ICH.  No new issues overnight.   ROS limited by cognitive status Objective: Vital Signs: Blood pressure 153/73, pulse 66, temperature 98.7 F (37.1 C), temperature source Oral, resp. rate 18, height _0  (1.549 m), weight 69.1 kg (152 lb 5.4 oz), SpO2 97.00%. No results found. Results for orders placed during the hospital encounter of 11/16/13 (from the past 72 hour(s))  GLUCOSE, CAPILLARY     Status: None   Collection Time    11/16/13  8:45 PM      Result Value Ref Range   Glucose-Capillary 93  70 - 99 mg/dL  CBC WITH DIFFERENTIAL     Status: Abnormal   Collection Time    11/17/13  5:32 AM      Result Value Ref Range   WBC 6.2  4.0 - 10.5 K/uL   RBC 3.90  3.87 - 5.11 MIL/uL   Hemoglobin 9.1 (*) 12.0 - 15.0 g/dL   HCT 27.5 (*) 36.0 - 46.0 %   MCV 70.5 (*) 78.0 - 100.0 fL   MCH 23.3 (*) 26.0 - 34.0 pg   MCHC 33.1  30.0 - 36.0 g/dL   RDW 16.1 (*) 11.5 - 15.5 %   Platelets 206  150 - 400 K/uL   Neutrophils Relative % 50  43 - 77 %   Neutro Abs 3.2  1.7 - 7.7 K/uL   Lymphocytes Relative 34  12 - 46 %   Lymphs Abs 2.1  0.7 - 4.0 K/uL   Monocytes Relative 13 (*) 3 - 12 %   Monocytes Absolute 0.8  0.1 - 1.0 K/uL   Eosinophils Relative 3  0 - 5 %   Eosinophils Absolute 0.2  0.0 - 0.7 K/uL   Basophils Relative 0  0 - 1 %   Basophils Absolute 0.0  0.0 - 0.1 K/uL   COMPREHENSIVE METABOLIC PANEL     Status: Abnormal   Collection Time    11/17/13  5:32 AM      Result Value Ref Range   Sodium 139  137 - 147 mEq/L   Potassium 3.3 (*) 3.7 - 5.3 mEq/L   Chloride 105  96 - 112 mEq/L   CO2 22  19 - 32 mEq/L   Glucose, Bld 84  70 - 99 mg/dL   BUN 8  6 - 23 mg/dL   Creatinine, Ser 1.08  0.50 - 1.10 mg/dL   Calcium 8.8  8.4 - 10.5 mg/dL   Total Protein 6.5  6.0 - 8.3 g/dL   Albumin 2.6 (*) 3.5 - 5.2 g/dL   AST 16  0 - 37 U/L   ALT 8  0 - 35 U/L   Alkaline Phosphatase 31 (*) 39 - 117 U/L   Total Bilirubin 0.6  0.3 - 1.2 mg/dL   GFR calc non Af Amer 43 (*) >90 mL/min   GFR calc Af Amer 50 (*) >90 mL/min   Comment: (NOTE)  The eGFR has been calculated using the CKD EPI equation.     This calculation has not been validated in all clinical situations.     eGFR's persistently <90 mL/min signify possible Chronic Kidney     Disease.  GLUCOSE, CAPILLARY     Status: None   Collection Time    11/17/13  7:02 AM      Result Value Ref Range   Glucose-Capillary 81  70 - 99 mg/dL   Comment 1 Notify RN    GLUCOSE, CAPILLARY     Status: None   Collection Time    11/17/13 11:25 AM      Result Value Ref Range   Glucose-Capillary 89  70 - 99 mg/dL   Comment 1 Notify RN    GLUCOSE, CAPILLARY     Status: Abnormal   Collection Time    11/17/13  4:26 PM      Result Value Ref Range   Glucose-Capillary 112 (*) 70 - 99 mg/dL   Comment 1 Notify RN    GLUCOSE, CAPILLARY     Status: Abnormal   Collection Time    11/17/13  9:23 PM      Result Value Ref Range   Glucose-Capillary 130 (*) 70 - 99 mg/dL  GLUCOSE, CAPILLARY     Status: None   Collection Time    11/18/13  7:02 AM      Result Value Ref Range   Glucose-Capillary 76  70 - 99 mg/dL   Comment 1 Notify RN        HENT:  Head: Normocephalic and atraumatic.  Edentulous  Eyes: Pupils are equal, round, and reactive to light.  Neck: Normal range of motion. Neck supple.  Cardiovascular: Normal rate and  regular rhythm.  Respiratory: Effort normal and breath sounds normal.  GI: Soft. Bowel sounds are normal. She exhibits no distension. There is no tenderness.  Musculoskeletal: She exhibits edema (pedal edema ).  RA changes bilateral hands and feet. Well healed old B-TKR and hammer toe repair incisions.  Neurological: She is alert.  Oriented to self and place. Able to follow simple one step commands. Fair awareness Mild left central 7. RUE 4- to 4/5 prox to distal with weakness with grip to RA. LUE 3+ to 4/5. LLE 3-/5 HF, KE, 3- ankles. RLE 3 to 3+/5. No sensory deficits  Skin: Skin is warm and dry.  Slowed responses, oriented to person and place  Needs verbal and physical cues for manual muscle testing with repeated instructions   Assessment/Plan: 1. Functional deficits secondary to Right caudate ICH which require 3+ hours per day of interdisciplinary therapy in a comprehensive inpatient rehab setting. Physiatrist is providing close team supervision and 24 hour management of active medical problems listed below. Physiatrist and rehab team continue to assess barriers to discharge/monitor patient progress toward functional and medical goals. FIM: FIM - Bathing Bathing Steps Patient Completed: Chest;Left Arm;Front perineal area;Right upper leg;Left upper leg Bathing: 3: Mod-Patient completes 5-7 61f10 parts or 50-74%  FIM - Upper Body Dressing/Undressing Upper body dressing/undressing: 0: Wears gown/pajamas-no public clothing FIM - Lower Body Dressing/Undressing Lower body dressing/undressing: 0: Wears gown/pajamas-no public clothing  FIM - Toileting Toileting: 1: Total-Patient completed zero steps, helper did all 3  FIM - TRadio producerDevices: Grab bars Toilet Transfers: 2-To toilet/BSC: Max A (lift and lower assist);3-From toilet/BSC: Mod A (lift or lower assist)  FIM - BControl and instrumentation engineerDevices: Walker;Arm  rests Bed/Chair Transfer: 3: Bed >  Chair or W/C: Mod A (lift or lower assist);3: Chair or W/C > Bed: Mod A (lift or lower assist)  FIM - Locomotion: Wheelchair Distance: 20 Locomotion: Wheelchair: 0: Activity did not occur FIM - Locomotion: Ambulation Locomotion: Ambulation Assistive Devices: Administrator Ambulation/Gait Assistance: 4: Min assist Locomotion: Ambulation: 1: Travels less than 50 ft with minimal assistance (Pt.>75%)  Comprehension Comprehension Mode: Auditory Comprehension: 3-Understands basic 50 - 74% of the time/requires cueing 25 - 50%  of the time  Expression Expression Mode: Verbal Expression: 5-Expresses basic needs/ideas: With extra time/assistive device  Social Interaction Social Interaction: 5-Interacts appropriately 90% of the time - Needs monitoring or encouragement for participation or interaction.  Problem Solving Problem Solving: 4-Solves basic 75 - 89% of the time/requires cueing 10 - 24% of the time  Memory Memory: 2-Recognizes or recalls 25 - 49% of the time/requires cueing 51 - 75% of the time  Medical Problem List and Plan:  1. Functional deficits secondary to Right caudate ICH  2. PE/ H/o DVT/Anticoagulation: Mechanical: Antiembolism stockings, knee (TED hose) Bilateral lower extremities  Sequential compression devices, below knee Bilateral lower extremities  3. Pain Management: Will continue Vicodin every 6 hours prn  4. Mood: Continue Remeron at bedtime. LCSW to follow for evaluation and support.  5. Mild dementia/Neuropsych: Did not tolerate Aricept trial due to SE. This patient is not capable of making decisions on her own behalf.  6. HTN: Will monitor BP every 8 hours. Continue cardizem tid at 70m due to bradycardia--may need to reduce further if persistent. 7. CKD: Baseline Cr-1.4 at admission. Improved to 1.08, but K is low will supplement 8. RA: Will continue chronic steroids.    LOS (Days) 2 A FACE TO FACE EVALUATION WAS  PERFORMED  ZMeredith Staggers5/16/2015, 9:44 AM

## 2013-11-18 NOTE — Progress Notes (Addendum)
INITIAL NUTRITION ASSESSMENT  DOCUMENTATION CODES Per approved criteria  -Not Applicable   INTERVENTION: Add Ensure Complete po daily, each supplement provides 350 kcal and 13 grams of protein. Add Ensure Pudding po prn, each supplement provides 170 kcal and 4 grams of protein, may use for meds. RD to continue to follow nutrition care plan.  NUTRITION DIAGNOSIS: Inadequate oral intake related to advanced age as evidenced by family report.   Goal: Intake to meet >90% of estimated nutrition needs.  Monitor:  weight trends, lab trends, I/O's, PO intake, supplement tolerance  Reason for Assessment: RN Consult for Supplement Choices  80 y.o. female  Admitting Dx: hemorrhagic CVA  ASSESSMENT: PMHx significant for bilateral TKR, hypertension, rheumatoid arthritis with chronic prednisone, DVT with chronic Coumadin and chronic renal insufficiency baseline creatinine 1.46. Admitted with ICH. Transferred to CIR from acute hospital on 5/14.  RN reports that daughter would like vanilla Boost. Discussed with daughter supplement choices outside of patient's room as she was receiving bath - unable to complete physical assessment on patient at this time. Daughter reports that pt's weight has been stable and her intake at baseline is minimal, likely 2/2 advanced age.  BSE on 5/12 - recommends Dysphagia 2 diet with thin liquids, meds crushed in purees. Eating 10-25%.  CBG's: 76 - 130 Potassium low at 3.3  Height: Ht Readings from Last 1 Encounters:  11/16/13 5\' 1"  (1.549 m)    Weight: Wt Readings from Last 1 Encounters:  11/16/13 152 lb 5.4 oz (69.1 kg)    Ideal Body Weight: 105 lb  % Ideal Body Weight: 145%  Wt Readings from Last 10 Encounters:  11/16/13 152 lb 5.4 oz (69.1 kg)  11/14/13 176 lb 9.4 oz (80.1 kg)  05/10/13 147 lb (66.679 kg)  04/11/13 147 lb (66.679 kg)  10/21/12 151 lb 0.2 oz (68.5 kg)    Usual Body Weight: 147 lb  % Usual Body Weight: 103%  BMI:  Body mass  index is 28.8 kg/(m^2). Overweight  Estimated Nutritional Needs: Kcal: 1400 - 1600 Protein: 70 - 80 g Fluid: at least 1.5 liters daily  Skin: intact  Diet Order: Dysphagia 2; thins  EDUCATION NEEDS: -No education needs identified at this time   Intake/Output Summary (Last 24 hours) at 11/18/13 1059 Last data filed at 11/18/13 0900  Gross per 24 hour  Intake    720 ml  Output      0 ml  Net    720 ml    Last BM: 5/13  Labs:   Recent Labs Lab 11/13/13 0635 11/17/13 0532  NA 136* 139  K 3.8 3.3*  CL 97 105  CO2 25 22  BUN 16 8  CREATININE 1.46* 1.08  CALCIUM 9.4 8.8  GLUCOSE 86 84    CBG (last 3)   Recent Labs  11/17/13 1626 11/17/13 2123 11/18/13 0702  GLUCAP 112* 130* 76    Scheduled Meds: . diltiazem  30 mg Oral TID  . mirtazapine  15 mg Oral QHS  . mupirocin ointment  1 application Nasal BID  . pantoprazole  40 mg Oral QPC breakfast  . potassium chloride  10 mEq Oral Daily  . predniSONE  5 mg Oral Q breakfast  . senna-docusate  1 tablet Oral BID    Continuous Infusions:   Past Medical History  Diagnosis Date  . Hypertension   . Bronchitis     "not chronic; not recurrent" (11/13/2013)  . Chronic anemia   . GERD (gastroesophageal reflux disease)   .  Pulmonary embolism   . DVT (deep venous thrombosis)     "RLE?"  . Rheumatoid arthritis   . Stroke     11/13/2013 "she has a slow bleed"  . Chronic kidney disease (CKD), stage II (mild)     Stage II, creatine clearance 40-50 cc /minute    Past Surgical History  Procedure Laterality Date  . Replacement total knee bilateral Bilateral   . Joint replacement    . Tubal ligation    . Carpal tunnel release    . Dilation and curettage of uterus    . Cataract extraction w/ intraocular lens  implant, bilateral Bilateral     Jarold Motto MS, RD, LDN Inpatient Registered Dietitian Pager: [phone] After-hours pager: [phone]

## 2013-11-18 NOTE — Progress Notes (Signed)
Speech Language Pathology Daily Session Note  Patient Details  Name: Shelly [name] MRN: [id number] Date of Birth: Jan 11, 1919  Today's Date: 11/18/2013 Time: 9794-8016 Time Calculation (min): 30 min  Short Term Goals: Week 1: SLP Short Term Goal 1 (Week 1): Pt will improve mastication and oral manipulation of solids with min assist-supervision cues for swallowing safety during trials of upgraded dys. 3 and/or regular solids.   SLP Short Term Goal 2 (Week 1): Pt will tolerate dys. 2 solids and thin liquids during functional meals and/or snacks with no overt s/sx of aspiration with min assist-supervision cues for swallowing safety.   SLP Short Term Goal 3 (Week 1): Pt will improve her safety awareness during functional tasks for 80% accuracy and min assist.    Skilled Therapeutic Interventions: Therapeutic intervention for dysphagia complete, with short term goals addressed.  The patient declined trial of DIII, because she is unable to wear dentures due to ulcer on upper gum (anteriorly).  Daughter states that she wears dentures when consuming nutrition.  She was given trial of DII, no cough or throat clear but spat out bigger pieces of DII textures.  She had cough and  throat clear with thins via cup; however, daughter states that she was coughing and clearing prior to session.  Patient and daughter were shown Music therapist for pharyngeal strengthening.  Patient required max multimodal cues to attempt exercise.  Continue with current treatment plan.    FIM:  FIM - Eating Eating Activity: 4: Helper occasionally scoops food on utensil  Pain Pain Assessment Pain Assessment: No/denies pain Pain Score: 0-No pain Faces Pain Scale: No hurt  Therapy/Group: Individual Therapy  Shelly [name] Shelly [name] 11/18/2013, 1:59 PM

## 2013-11-18 NOTE — Progress Notes (Signed)
Occupational Therapy Session Note  Patient Details  Name: Shelly [name] MRN: [id number] Date of Birth: [birth year]  Today's Date: 11/18/2013 Time: 6945-0388 Time Calculation (min): 75 min   Skilled Therapeutic Interventions/Progress Updates: Patient participated in skilled OT with youngest dtr present today as follows:  UB bathing= setup and encourgement to participate; LB bathing= Total Assist however, patient able to stand with fair balance for pericleansing and donning of brief and pants; UB dressing=Mod A; LB dressing = total A .  Patient with difficult wash and dressing feet.  As well, patient c/o pain in bilateral hand (rheumatoid arthritis per dtr).  Patient also complained of being extremely cold.  Patient required extra time to process self care instructions and to initiate the required tasks.   Patient was left in w/c in her room with her dtr at end of session.  Therapy Documentation Precautions:  Precautions Precautions: Fall Precaution Comments: Generalzied we Restrictions Weight Bearing Restrictions: No  Pain:arthritic in both hands no rating given.  RN gave pain meds  See FIM for current functional status  Therapy/Group: Individual Therapy  Shelly [name] 11/18/2013, 4:27 PM

## 2013-11-18 NOTE — Progress Notes (Signed)
Occupational Therapy Session Note  Patient Details  Name: Shelly [name] MRN: [id number] Date of Birth: [birth year]  Today's Date: 11/18/2013 Time: 1157-2620 Time Calculation (min): 45 min   Skilled Therapeutic Interventions/Progress Updates: Though stated, "I do not know why I cannot stay awake," with encouragement and prompting patient participated in skilled OT practice of AE for LB dressing and long handled sponge use.  She set for a few seconds before and if at all carrying out the demonstrated or requested task by this clinician.  Patient was able to demonstrate use of long handled sponge to wash her feet and required extra time and Max Hand over hand assist to place and properly use AE for picking up items off the floor.  She requested to ly down at end of session and was left in bed with call bell, bed alarm and table within reach.  Older of 2 dtrs arrived at the end of the session.     Therapy Documentation Precautions:  Precautions Precautions: Fall Precaution Comments: Generalzied we Restrictions Weight Bearing Restrictions: No  Pain: Pain Assessment Pain Assessment: No/denies pain     See FIM for current functional status  Therapy/Group: Individual Therapy  Rozelle Logan 11/18/2013, 4:34 PM

## 2013-11-19 ENCOUNTER — Inpatient Hospital Stay (HOSPITAL_COMMUNITY): Payer: Medicare Other

## 2013-11-19 ENCOUNTER — Inpatient Hospital Stay (HOSPITAL_COMMUNITY): Payer: Medicare Other | Admitting: Occupational Therapy

## 2013-11-19 LAB — GLUCOSE, CAPILLARY: Glucose-Capillary: 79 mg/dL (ref 70–99)

## 2013-11-19 NOTE — Progress Notes (Signed)
Occupational Therapy Session Note  Patient Details  Name: Shelly [name] MRN: [id number] Date of Birth: [birth year]  Today's Date: 11/19/2013 Time: 2094-7096 Time Calculation (min): 60 min  Skilled Therapeutic Interventions/Progress Updates: Patient scheduled for ADL.  Today patient required mod A to +2 assist as she was fairly stiff and slow from being in the bed since yesterday afternoon per dtr.  As well, patient was not able to cognitive follow 1 step commands or initiate tasks.  Dtr contributes this to her mom receiving "strong pain medication" and stated that her mom can only tolerate Tylenol.  Younger dtr present for the complete session.    Therapy Documentation Precautions:  Precautions Precautions: Fall Precaution Comments: Generalzied we Restrictions Weight Bearing Restrictions: No Pain:denied    See FIM for current functional status  Therapy/Group: Individual Therapy  Rozelle Logan 11/19/2013, 5:08 PM

## 2013-11-19 NOTE — Progress Notes (Signed)
Subjective/Complaints: 80 y.o. right-handed female with history of bilateral TKR, hypertension, baseline dementia, RA with chronic prednisone, DVT with chronic Coumadin and chronic renal insufficiency baseline creatinine 1.46. Per reports on 11/11/13, patient attempted to turn the light off, and was found face down on the floor but without any injuries. She was placed back in bed but was noted to be less talkative, with altered mental status, increase in urinary incontinence as well as decreased level of responsiveness since the fall. She was admitted on 11/13/13 for workup and Cranial CT scan showed right caudate head ICH.  No problems. Slept well. .   ROS limited by cognitive status Objective: Vital Signs: Blood pressure 135/64, pulse 98, temperature 98.7 F (37.1 C), temperature source Oral, resp. rate 18, height '5\' 1"'  (1.549 m), weight 69.1 kg (152 lb 5.4 oz), SpO2 92.00%. No results found. Results for orders placed during the hospital encounter of 11/16/13 (from the past 72 hour(s))  GLUCOSE, CAPILLARY     Status: None   Collection Time    11/16/13  8:45 PM      Result Value Ref Range   Glucose-Capillary 93  70 - 99 mg/dL  CBC WITH DIFFERENTIAL     Status: Abnormal   Collection Time    11/17/13  5:32 AM      Result Value Ref Range   WBC 6.2  4.0 - 10.5 K/uL   RBC 3.90  3.87 - 5.11 MIL/uL   Hemoglobin 9.1 (*) 12.0 - 15.0 g/dL   HCT 27.5 (*) 36.0 - 46.0 %   MCV 70.5 (*) 78.0 - 100.0 fL   MCH 23.3 (*) 26.0 - 34.0 pg   MCHC 33.1  30.0 - 36.0 g/dL   RDW 16.1 (*) 11.5 - 15.5 %   Platelets 206  150 - 400 K/uL   Neutrophils Relative % 50  43 - 77 %   Neutro Abs 3.2  1.7 - 7.7 K/uL   Lymphocytes Relative 34  12 - 46 %   Lymphs Abs 2.1  0.7 - 4.0 K/uL   Monocytes Relative 13 (*) 3 - 12 %   Monocytes Absolute 0.8  0.1 - 1.0 K/uL   Eosinophils Relative 3  0 - 5 %   Eosinophils Absolute 0.2  0.0 - 0.7 K/uL   Basophils Relative 0  0 - 1 %   Basophils Absolute 0.0  0.0 - 0.1 K/uL   COMPREHENSIVE METABOLIC PANEL     Status: Abnormal   Collection Time    11/17/13  5:32 AM      Result Value Ref Range   Sodium 139  137 - 147 mEq/L   Potassium 3.3 (*) 3.7 - 5.3 mEq/L   Chloride 105  96 - 112 mEq/L   CO2 22  19 - 32 mEq/L   Glucose, Bld 84  70 - 99 mg/dL   BUN 8  6 - 23 mg/dL   Creatinine, Ser 1.08  0.50 - 1.10 mg/dL   Calcium 8.8  8.4 - 10.5 mg/dL   Total Protein 6.5  6.0 - 8.3 g/dL   Albumin 2.6 (*) 3.5 - 5.2 g/dL   AST 16  0 - 37 U/L   ALT 8  0 - 35 U/L   Alkaline Phosphatase 31 (*) 39 - 117 U/L   Total Bilirubin 0.6  0.3 - 1.2 mg/dL   GFR calc non Af Amer 43 (*) >90 mL/min   GFR calc Af Amer 50 (*) >90 mL/min   Comment: (NOTE)  The eGFR has been calculated using the CKD EPI equation.     This calculation has not been validated in all clinical situations.     eGFR's persistently <90 mL/min signify possible Chronic Kidney     Disease.  GLUCOSE, CAPILLARY     Status: None   Collection Time    11/17/13  7:02 AM      Result Value Ref Range   Glucose-Capillary 81  70 - 99 mg/dL   Comment 1 Notify RN    GLUCOSE, CAPILLARY     Status: None   Collection Time    11/17/13 11:25 AM      Result Value Ref Range   Glucose-Capillary 89  70 - 99 mg/dL   Comment 1 Notify RN    GLUCOSE, CAPILLARY     Status: Abnormal   Collection Time    11/17/13  4:26 PM      Result Value Ref Range   Glucose-Capillary 112 (*) 70 - 99 mg/dL   Comment 1 Notify RN    GLUCOSE, CAPILLARY     Status: Abnormal   Collection Time    11/17/13  9:23 PM      Result Value Ref Range   Glucose-Capillary 130 (*) 70 - 99 mg/dL  GLUCOSE, CAPILLARY     Status: None   Collection Time    11/18/13  7:02 AM      Result Value Ref Range   Glucose-Capillary 76  70 - 99 mg/dL   Comment 1 Notify RN    GLUCOSE, CAPILLARY     Status: Abnormal   Collection Time    11/18/13 11:34 AM      Result Value Ref Range   Glucose-Capillary 110 (*) 70 - 99 mg/dL   Comment 1 Notify RN    GLUCOSE, CAPILLARY      Status: Abnormal   Collection Time    11/18/13  4:52 PM      Result Value Ref Range   Glucose-Capillary 145 (*) 70 - 99 mg/dL  GLUCOSE, CAPILLARY     Status: Abnormal   Collection Time    11/18/13  8:55 PM      Result Value Ref Range   Glucose-Capillary 139 (*) 70 - 99 mg/dL  GLUCOSE, CAPILLARY     Status: None   Collection Time    11/19/13  7:34 AM      Result Value Ref Range   Glucose-Capillary 79  70 - 99 mg/dL      HENT:  Head: Normocephalic and atraumatic.  Edentulous  Eyes: Pupils are equal, round, and reactive to light.  Neck: Normal range of motion. Neck supple.  Cardiovascular: Normal rate and regular rhythm.  Respiratory: Effort normal and breath sounds normal.  GI: Soft. Bowel sounds are normal. She exhibits no distension. There is no tenderness.  Musculoskeletal: She exhibits edema (pedal edema ).  RA changes bilateral hands and feet. Well healed old B-TKR and hammer toe repair incisions.  Neurological: She is alert.  Oriented to self and place. Able to follow simple one step commands. Fair awareness Mild left central 7. RUE 4- to 4/5 prox to distal with weakness with grip to RA. LUE 3+ to 4/5. LLE 3-/5 HF, KE, 3- ankles. RLE 3 to 3+/5. No sensory deficits  Skin: Skin is warm and dry.  Slowed responses, oriented to person and place  Needs verbal and physical cues for manual muscle testing with repeated instructions   Assessment/Plan: 1. Functional deficits secondary to Right caudate ICH  which require 3+ hours per day of interdisciplinary therapy in a comprehensive inpatient rehab setting. Physiatrist is providing close team supervision and 24 hour management of active medical problems listed below. Physiatrist and rehab team continue to assess barriers to discharge/monitor patient progress toward functional and medical goals. FIM: FIM - Bathing Bathing Steps Patient Completed: Chest;Right Arm;Left Arm;Abdomen;Right upper leg;Left upper leg Bathing: 3:  Mod-Patient completes 5-7 [id number] parts or 50-74%  FIM - Upper Body Dressing/Undressing Upper body dressing/undressing steps patient completed: Thread/unthread right bra strap;Thread/unthread left bra strap;Thread/unthread right sleeve of pullover shirt/dresss;Thread/unthread left sleeve of pullover shirt/dress Upper body dressing/undressing: 3: Mod-Patient completed 50-74% of tasks FIM - Lower Body Dressing/Undressing Lower body dressing/undressing: 1: Total-Patient completed less than 25% of tasks  FIM - Toileting Toileting: 0: Activity did not occur  FIM - TRadio producerDevices: GProduct managerTransfers: 0-Activity did not occur  FIM - BControl and instrumentation engineerDevices: WEnvironmental consultantArm rests;Bed rails;HOB elevated Bed/Chair Transfer: 3: Bed > Chair or W/C: Mod A (lift or lower assist);3: Chair or W/C > Bed: Mod A (lift or lower assist);3: Supine > Sit: Mod A (lifting assist/Pt. 50-74%/lift 2 legs  FIM - Locomotion: Wheelchair Distance: 20 Locomotion: Wheelchair: 1: Total Assistance/staff pushes wheelchair (Pt<25%) FIM - Locomotion: Ambulation Locomotion: Ambulation Assistive Devices: Parallel bars Ambulation/Gait Assistance: 3: Mod assist Locomotion: Ambulation: 1: Travels less than 50 ft with moderate assistance (Pt: 50 - 74%)  Comprehension Comprehension Mode: Auditory Comprehension: 3-Understands basic 50 - 74% of the time/requires cueing 25 - 50%  of the time  Expression Expression Mode: Verbal Expression: 5-Expresses basic 90% of the time/requires cueing < 10% of the time.  Social Interaction Social Interaction: 5-Interacts appropriately 90% of the time - Needs monitoring or encouragement for participation or interaction.  Problem Solving Problem Solving: 4-Solves basic 75 - 89% of the time/requires cueing 10 - 24% of the time  Memory Memory: 2-Recognizes or recalls 25 - 49% of the time/requires cueing 51 - 75% of the  time  Medical Problem List and Plan:  1. Functional deficits secondary to Right caudate ICH  2. PE/ H/o DVT/Anticoagulation: Mechanical: Antiembolism stockings, knee (TED hose) Bilateral lower extremities  Sequential compression devices, below knee Bilateral lower extremities  3. Pain Management:   Vicodin every 6 hours prn  4. Mood: Continue Remeron at bedtime. LCSW to follow for evaluation and support.  5. Mild dementia/Neuropsych: Did not tolerate Aricept trial due to SE. This patient is not capable of making decisions on her own behalf.  6. HTN: Will monitor BP every 8 hours. Continue cardizem tid at 350mdue to bradycardia--may need to reduce further if persistent. 7. CKD: Baseline Cr-1.4 at admission. Improved to 1.08, K+ replacement 8. RA:   continue chronic steroids. 9. ?hyperglycemia: sugars normal---dc checks   LOS (Days) 3 A FACE TO FACE EVALUATION WAS PERFORMED  ZaMeredith Staggers/17/2015, 9:17 AM

## 2013-11-20 ENCOUNTER — Inpatient Hospital Stay (HOSPITAL_COMMUNITY): Payer: Medicare Other | Admitting: *Deleted

## 2013-11-20 ENCOUNTER — Inpatient Hospital Stay (HOSPITAL_COMMUNITY): Payer: Medicare Other | Admitting: Occupational Therapy

## 2013-11-20 ENCOUNTER — Inpatient Hospital Stay (HOSPITAL_COMMUNITY): Payer: Medicare Other | Admitting: Speech Pathology

## 2013-11-20 DIAGNOSIS — I619 Nontraumatic intracerebral hemorrhage, unspecified: Secondary | ICD-10-CM

## 2013-11-20 NOTE — Progress Notes (Signed)
Subjective/Complaints: 80 y.o. right-handed female with history of bilateral TKR, hypertension, baseline dementia, RA with chronic prednisone, DVT with chronic Coumadin and chronic renal insufficiency baseline creatinine 1.46. Per reports on 11/11/13, patient attempted to turn the light off, and was found face down on the floor but without any injuries. She was placed back in bed but was noted to be less talkative, with altered mental status, increase in urinary incontinence as well as decreased level of responsiveness since the fall. She was admitted on 11/13/13 for workup and Cranial CT scan showed right caudate head ICH. Confused oriented to person only  No pain c/os  ROS limited by cognitive status Objective: Vital Signs: Blood pressure 137/62, pulse 78, temperature 99.1 F (37.3 C), temperature source Oral, resp. rate 19, height 5\' 1"  (1.549 m), weight 69.1 kg (152 lb 5.4 oz), SpO2 100.00%. No results found. Results for orders placed during the hospital encounter of 11/16/13 (from the past 72 hour(s))  GLUCOSE, CAPILLARY     Status: None   Collection Time    11/17/13 11:25 AM      Result Value Ref Range   Glucose-Capillary 89  70 - 99 mg/dL   Comment 1 Notify RN    GLUCOSE, CAPILLARY     Status: Abnormal   Collection Time    11/17/13  4:26 PM      Result Value Ref Range   Glucose-Capillary 112 (*) 70 - 99 mg/dL   Comment 1 Notify RN    GLUCOSE, CAPILLARY     Status: Abnormal   Collection Time    11/17/13  9:23 PM      Result Value Ref Range   Glucose-Capillary 130 (*) 70 - 99 mg/dL  GLUCOSE, CAPILLARY     Status: None   Collection Time    11/18/13  7:02 AM      Result Value Ref Range   Glucose-Capillary 76  70 - 99 mg/dL   Comment 1 Notify RN    GLUCOSE, CAPILLARY     Status: Abnormal   Collection Time    11/18/13 11:34 AM      Result Value Ref Range   Glucose-Capillary 110 (*) 70 - 99 mg/dL   Comment 1 Notify RN    GLUCOSE, CAPILLARY     Status: Abnormal   Collection Time    11/18/13  4:52 PM      Result Value Ref Range   Glucose-Capillary 145 (*) 70 - 99 mg/dL  GLUCOSE, CAPILLARY     Status: Abnormal   Collection Time    11/18/13  8:55 PM      Result Value Ref Range   Glucose-Capillary 139 (*) 70 - 99 mg/dL  GLUCOSE, CAPILLARY     Status: None   Collection Time    11/19/13  7:34 AM      Result Value Ref Range   Glucose-Capillary 79  70 - 99 mg/dL      HENT:  Head: Normocephalic and atraumatic.  Edentulous  Eyes: Pupils are equal, round, and reactive to light.  Neck: Normal range of motion. Neck supple.  Cardiovascular: Normal rate and regular rhythm.  Respiratory: Effort normal and breath sounds normal.  GI: Soft. Bowel sounds are normal. She exhibits no distension. There is no tenderness.  Musculoskeletal: She exhibits edema (pedal edema ).  RA changes bilateral hands and feet. Well healed old B-TKR and hammer toe repair incisions.  Neurological: She is alert.  Oriented to self and place. Able to follow simple one step  commands. Fair awareness Mild left central 7. RUE 4- to 4/5 prox to distal with weakness with grip to RA. LUE 3+ to 4/5. LLE 3-/5 HF, KE, 3- ankles. RLE 3 to 3+/5. No sensory deficits  Skin: Skin is warm and dry.  Slowed responses, oriented to person and place  Needs verbal and physical cues for manual muscle testing with repeated instructions   Assessment/Plan: 1. Functional deficits secondary to Right caudate ICH which require 3+ hours per day of interdisciplinary therapy in a comprehensive inpatient rehab setting. Physiatrist is providing close team supervision and 24 hour management of active medical problems listed below. Physiatrist and rehab team continue to assess barriers to discharge/monitor patient progress toward functional and medical goals. FIM: FIM - Bathing Bathing Steps Patient Completed: Left Arm;Right Arm Bathing: 1: Total-Patient completes 0-2 of 10 parts or less than 25%  FIM - Upper  Body Dressing/Undressing Upper body dressing/undressing steps patient completed: Thread/unthread right bra strap;Thread/unthread left bra strap;Thread/unthread right sleeve of pullover shirt/dresss Upper body dressing/undressing: 2: Max-Patient completed 25-49% of tasks FIM - Lower Body Dressing/Undressing Lower body dressing/undressing: 1: Total-Patient completed less than 25% of tasks  FIM - Toileting Toileting: 1: Two helpers  FIM - Diplomatic Services operational officer Devices: Bedside commode;Grab bars;Walker Toilet Transfers: 1-Two helpers  FIM - Banker Devices: Environmental consultant;Arm rests;Bed rails;HOB elevated Bed/Chair Transfer: 3: Bed > Chair or W/C: Mod A (lift or lower assist);3: Chair or W/C > Bed: Mod A (lift or lower assist);3: Supine > Sit: Mod A (lifting assist/Pt. 50-74%/lift 2 legs  FIM - Locomotion: Wheelchair Distance: 20 Locomotion: Wheelchair: 1: Total Assistance/staff pushes wheelchair (Pt<25%) FIM - Locomotion: Ambulation Locomotion: Ambulation Assistive Devices: Parallel bars Ambulation/Gait Assistance: 3: Mod assist Locomotion: Ambulation: 1: Travels less than 50 ft with moderate assistance (Pt: 50 - 74%)  Comprehension Comprehension Mode: Auditory Comprehension: 3-Understands basic 50 - 74% of the time/requires cueing 25 - 50%  of the time  Expression Expression Mode: Verbal Expression: 5-Expresses basic 90% of the time/requires cueing < 10% of the time.  Social Interaction Social Interaction: 5-Interacts appropriately 90% of the time - Needs monitoring or encouragement for participation or interaction.  Problem Solving Problem Solving: 4-Solves basic 75 - 89% of the time/requires cueing 10 - 24% of the time  Memory Memory: 2-Recognizes or recalls 25 - 49% of the time/requires cueing 51 - 75% of the time  Medical Problem List and Plan:  1. Functional deficits secondary to Right caudate ICH  2. PE/ H/o  DVT/Anticoagulation: Mechanical: Antiembolism stockings, knee (TED hose) Bilateral lower extremities  Sequential compression devices, below knee Bilateral lower extremities  3. Pain Management:   Vicodin every 6 hours prn  4. Mood: Continue Remeron at bedtime. LCSW to follow for evaluation and support.  5. Mild dementia/Neuropsych: Did not tolerate Aricept trial due to SE. This patient is not capable of making decisions on her own behalf.  6. HTN: Will monitor BP every 8 hours. Continue cardizem tid at 30mg  due to bradycardia--may need to reduce further if persistent. 7. CKD: Baseline Cr-1.4 at admission. Improved to 1.08, K+ replacement 8. RA:   continue chronic steroids. 9. ?hyperglycemia: sugars normal---dc checks   LOS (Days) 4 A FACE TO FACE EVALUATION WAS PERFORMED  11/20/2013, 7:23 AM

## 2013-11-20 NOTE — Progress Notes (Addendum)
Physical Therapy Session Note  Patient Details  Name: Shelly [name] MRN: [id number] Date of Birth: [birth year]  Today's Date: 11/20/2013 Time: 1005-1105 , [id number]  Time Calculation (min): 60 min, 30 min  Short Term Goals: Week 1:  PT Short Term Goal 1 (Week 1): STG=LTG due to LOS, Supervision overall for transfers and gait, Min A 2 stairs 1 rail  Skilled Therapeutic Interventions/Progress Updates:   Tx 1:  Therapeutic activity of transfer w/c> Kinetron seat to R with min guard assist. neuromuscular re-education in sitting on Kinetron at level 30 cm/ second x 10 cycles x 3, with physical assistance for R and L LE movements, and attention to task; and w/c propulsion x 10' using bil LEs mod assist to propel .  Gait x 8' with min assist, RW, before pt's R knee buckled slightly.  Pt fearful, immediately attempting to sit down on the nearest surface.  ACE wrap applied to R knee; pt ambulated x 10' before same problem, no apparent relief of R knee instability.  Pt extremely distracted by RW, environment to R.  Dtr [name] arrived and observed tx.  3rd walk in quiet hall, x 15' with better focus, but continued pain/instability of R knee limiting distances. Dtr reported that pt ambulated at home QD, even on days when RA more painful.  She will look at home for knee braces which pt used before knee replacements.  Participation limited due to extreme distractibility, R knee pain.   Tx 2:  Pt premedicated for R knee pain.  Therapeutic activity in room: sit> stand at sink, standing endurance at sink during oral care.  Dtr reported that pt has been pulling up on sink for years.  Sit> stand repeatedly with pt pulling up with 1-2 hands, min-mod assist.  Static standing at sink with min/mod assist and max cues for attending to task.  Pt tolerated standing x 2 minutes at best. When using L and/or R hand to manipulate oral care items, pt intermittently leaned forward onto sink.  Participation better in quiet  environment of pt's room, but still distractable.  R knee pain continues to limit her.     Therapy Documentation Precautions:  Precautions Precautions: Fall Precaution Comments: Generalzied we Restrictions Weight Bearing Restrictions: No Pain: Pain Assessment Pain Assessment: No/denies pain at rest; R knee with ambulation.  RN informed.      See FIM for current functional status  Therapy/Group: Individual Therapy  Susa Loffler 11/20/2013, 12:17 PM

## 2013-11-20 NOTE — Progress Notes (Signed)
Speech Language Pathology Daily Session Note  Patient Details  Name: Shelly [name] MRN: [id number] Date of Birth: 1919-03-22  Today's Date: 11/20/2013 Time: 6734-1937 Time Calculation (min): 43 min  Short Term Goals: Week 1: SLP Short Term Goal 1 (Week 1): Pt will improve mastication and oral manipulation of solids with min assist-supervision cues for swallowing safety during trials of upgraded dys. 3 and/or regular solids.   SLP Short Term Goal 2 (Week 1): Pt will tolerate dys. 2 solids and thin liquids during functional meals and/or snacks with no overt s/sx of aspiration with min assist-supervision cues for swallowing safety.   SLP Short Term Goal 3 (Week 1): Pt will improve her safety awareness during functional tasks for 80% accuracy and min assist.    Skilled Therapeutic Interventions: Pt was seen in room for skilled speech therapy targeting dysphagia management and orientation.  Pt was seated upright in wheelchair upon arrival and appeared lethargic from morning therapy session.  Pt required mod-max assist to sustain attention to both structured and unstructured tasks due to fatigue.  Pt was observed with presentations of her prescribed diet with delayed throat clear x2 with trials of thin liquids via straw, delayed throat clear x1 with cup sips of thin liquids, and no overt s/s of aspiration with dys 2 textures.  Pt was noted with baseline congested cough prior to initiating PO intake; however she exhibited no changes in vocal quality with PO trials and was noted with a strong volitional cough to clear secretions orally.  Pt required mod assist to reorient to situation and date secondary to fatigue, although she was independently oriented to place with increased processing time.     FIM:  Comprehension Comprehension Mode: Auditory Comprehension: 3-Understands basic 50 - 74% of the time/requires cueing 25 - 50%  of the time Expression Expression Mode: Verbal Expression: 4-Expresses basic  75 - 89% of the time/requires cueing 10 - 24% of the time. Needs helper to occlude trach/needs to repeat words. Social Interaction Social Interaction: 4-Interacts appropriately 75 - 89% of the time - Needs redirection for appropriate language or to initiate interaction. Problem Solving Problem Solving: 3-Solves basic 50 - 74% of the time/requires cueing 25 - 49% of the time Memory Memory: 2-Recognizes or recalls 25 - 49% of the time/requires cueing 51 - 75% of the time FIM - Eating Eating Activity: 4: Help with picking up utensils  Pain Pain Assessment Pain Assessment: No/denies pain  Therapy/Group: Individual Therapy  Melanee Spry Zacari Stiff M.A. CCC-SLP 11/20/2013, 11:16 AM

## 2013-11-20 NOTE — Progress Notes (Signed)
Occupational Therapy Session Note  Patient Details  Name: Shelly [name] MRN: [id number] Date of Birth: [birth year]  Today's Date: 11/20/2013 Time: 0805-0900 Time Calculation (min): 55 min  Short Term Goals: Week 1:  OT Short Term Goal 1 (Week 1): Pt will transfer to toilet with min A using grab bars in a squat/ stand pivot. OT Short Term Goal 2 (Week 1): Pt will don a shirt with min A. OT Short Term Goal 3 (Week 1): Pt will bathe with min A. OT Short Term Goal 4 (Week 1): Pt will stand with mod A to allow her to pull her pants up.  Skilled Therapeutic Interventions/Progress Updates:      Pt seen for BADL retraining of toileting, bathing, and dressing with a focus on attention to task and following 1 step directions. Pt sat to EOB with mod A, but needed total to transfer to w/c due to apraxia. Pt stood up from EOB with only min A but then stiffened up and wouldn't move to her L.  Pt gripped onto rail with R arm and needed assist to move her arms.  When assisting pt, she stated it hurt because her shoulders were being pulled on, even though this clinician's hands were around her trunk.  Pt needed to use toilet, again she did stand with bars easily but would not move her feet to sit down on toilet. Pt became incontinent on the floor.  Pt sat on toilet and had a BM but was not aware that she had one.  Pt needed max to total A with her dressing, mod with bathing. She needed a great deal of cuing to engage in tasks and was continually asking to go back to bed. Pt informed her next therapy session would start in 15 min. QRB applied to w/c and nsg tech in room to start pt with her breakfast.  Therapy Documentation Precautions:  Precautions Precautions: Fall Precaution Comments: Generalzied we Restrictions Weight Bearing Restrictions: No    Pain: Pain Assessment Pain Assessment: No/denies pain ADL: ADL ADL Comments: Refer to FIM  See FIM for current functional status  Therapy/Group:  Individual Therapy  Earle Gell 11/20/2013, 10:38 AM

## 2013-11-21 ENCOUNTER — Inpatient Hospital Stay (HOSPITAL_COMMUNITY): Payer: Medicare Other | Admitting: *Deleted

## 2013-11-21 ENCOUNTER — Inpatient Hospital Stay (HOSPITAL_COMMUNITY): Payer: Medicare Other | Admitting: Occupational Therapy

## 2013-11-21 DIAGNOSIS — I619 Nontraumatic intracerebral hemorrhage, unspecified: Secondary | ICD-10-CM

## 2013-11-21 LAB — BASIC METABOLIC PANEL
BUN: 9 mg/dL (ref 6–23)
CO2: 25 mEq/L (ref 19–32)
Calcium: 9.3 mg/dL (ref 8.4–10.5)
Chloride: 100 mEq/L (ref 96–112)
Creatinine, Ser: 1.09 mg/dL (ref 0.50–1.10)
GFR calc Af Amer: 49 mL/min — ABNORMAL LOW (ref >90)
GFR calc non Af Amer: 43 mL/min — ABNORMAL LOW (ref >90)
Glucose, Bld: 93 mg/dL (ref 70–99)
Potassium: 4 mEq/L (ref 3.7–5.3)
Sodium: 137 mEq/L (ref 137–147)

## 2013-11-21 NOTE — Progress Notes (Signed)
Occupational Therapy Session Note  Patient Details  Name: Shelly [name] MRN: [id number] Date of Birth: Apr 13, 1919  Today's Date: 11/21/2013 Time: 5621-3086 and 1105-1140 Time Calculation (min): 45 min and 35 min  Short Term Goals: Week 1:  OT Short Term Goal 1 (Week 1): Pt will transfer to toilet with min A using grab bars in a squat/ stand pivot. OT Short Term Goal 2 (Week 1): Pt will don a shirt with min A. OT Short Term Goal 3 (Week 1): Pt will bathe with min A. OT Short Term Goal 4 (Week 1): Pt will stand with mod A to allow her to pull her pants up.  Skilled Therapeutic Interventions/Progress Updates:    Visit 1:  No c/o pain.  Pt seen for BADL retraining of toileting, bathing, and dressing with a focus on awareness, attention, following directions, functional mobility. Pt was much more alert and able to follow directions, therefore she participated much more actively.  She seemed to understand the purpose of therapy and what she needed to do.  Pt was able to stand up from w/c numerous times with close S and steady A to stand pivot/ step during transfers.  Pt resting in chair at end of session in her room with call light in reach and QRB on.  Visit 2:  No c/o pain.  Pt seen this session to focus on standing balance skills and use of LUE.  Pt stood at tall table with close S as she used rainbow ring arc with alternating hands. Min cues to start on L hand. She rested in sitting while working on BUE AROM to increase shoulder AROM with dowel bar. Stood again to fasten clothes pins (light resistance).  Pt states she needed to use bathroom. Pt did stay continent and used bathroom in her room with min A. Pt's daughter in room and observed toileting session and given an update on pt's progress. Pt resting in chair at end of session in her room with call light in reach.  QRB not applied as daughter stated she would supervise her.     Therapy Documentation Precautions:  Precautions Precautions:  Fall Precaution Comments: Generalzied we Restrictions Weight Bearing Restrictions: No    Pain: Pain Assessment Pain Score: 0-No pain ADL: ADL ADL Comments: Refer to FIM  See FIM for current functional status  Therapy/Group: Individual Therapy  Earle Gell 11/21/2013, 11:54 AM

## 2013-11-21 NOTE — Progress Notes (Signed)
Physical Therapy Session Note  Patient Details  Name: Shelly [name] MRN: [id number] Date of Birth: 12-06-1918  Today's Date: 11/21/2013 Time:  10:00-11:00 ( ) and 13:05-14:00 ( )   Short Term Goals: Week 1:  PT Short Term Goal 1 (Week 1): STG=LTG due to LOS, Supervision overall for transfers and gait, Min A 2 stairs 1 rail  Skilled Therapeutic Interventions/Progress Updates:  Tx focused on functional mobiltiy, standing tolerance, cognitive remediation, and gait with RW. Knee wrapped with ACE for support. Tx in day room and apartment for decreased distractions.  Pt up in WC, oriented x2.  Propelled short distance with bil UEs.   Pt stood at high table x23min, x18min, and x69min during sustained attention and problem solving tasks with picutre cards. Pt able to sustain attention x30 sec at a time, then needing reorienting. Pt unable to identify unsafe situations in cards presented. Standing tolerance limited by fatigue. Attempted standing ex, but pt unable to sustain attention and stand, wanting to sit.   Performed gait training on carpet with RW x18', x25' with min A overall and cues for posture and increasing UE support as needed. Pt with very slow movements, especially during transitions.    --------  Tx focused on sustained attention, toilet transfer, gait with RW, and mobility in apartment with RW. Pt moving with Min A overall at this point, but still limited by fatigue and knee pain.  Performed toilet transfer with RW and Min A for steadying and during dynamic sitting balance.  Pt propelled WC short distance with min A.   Gait in controlled setting x40' with Min A overall for steadying and postural cues and facilitation.   Performed gait and transfers in apartment during functional attention and problem solving task of locating various items in kitchen. Pt needed cues for both problem solving and attention to task due to distractibility. Pt able to navigate in apartment with min A  and RW, however knee bothering pt by end of tx. Nursing made aware.   Daughter present, all needs in reach.       Therapy Documentation Precautions:  Precautions Precautions: Fall Precaution Comments: Generalzied we Restrictions Weight Bearing Restrictions: No   Pain: Pain Assessment Pain Score: 0-No pain Pain Type: Chronic pain Pain Location: Knee Pain Orientation: Right Pain Intervention(s): Medication (See eMAR)  See FIM for current functional status  Therapy/Group: Individual Therapy Clydene Laming, PT, DPT   11/21/2013, 10:36 AM

## 2013-11-21 NOTE — Progress Notes (Signed)
Subjective/Complaints: 80 y.o. right-handed female with history of bilateral TKR, hypertension, baseline dementia, RA with chronic prednisone, DVT with chronic Coumadin and chronic renal insufficiency baseline creatinine 1.46. Per reports on 11/11/13, patient attempted to turn the light off, and was found face down on the floor but without any injuries. She was placed back in bed but was noted to be less talkative, with altered mental status, increase in urinary incontinence as well as decreased level of responsiveness since the fall. She was admitted on 11/13/13 for workup and Cranial CT scan showed right caudate head ICH. Confused oriented to person only Doesn't remember me No pain c/os, slept ok  ROS limited by cognitive status Objective: Vital Signs: Blood pressure 117/78, pulse 77, temperature 97.4 F (36.3 C), temperature source Oral, resp. rate 18, height 5\' 1"  (1.549 m), weight 69.1 kg (152 lb 5.4 oz), SpO2 96.00%. No results found. Results for orders placed during the hospital encounter of 11/16/13 (from the past 72 hour(s))  GLUCOSE, CAPILLARY     Status: None   Collection Time    11/18/13  7:02 AM      Result Value Ref Range   Glucose-Capillary 76  70 - 99 mg/dL   Comment 1 Notify RN    GLUCOSE, CAPILLARY     Status: Abnormal   Collection Time    11/18/13 11:34 AM      Result Value Ref Range   Glucose-Capillary 110 (*) 70 - 99 mg/dL   Comment 1 Notify RN    GLUCOSE, CAPILLARY     Status: Abnormal   Collection Time    11/18/13  4:52 PM      Result Value Ref Range   Glucose-Capillary 145 (*) 70 - 99 mg/dL  GLUCOSE, CAPILLARY     Status: Abnormal   Collection Time    11/18/13  8:55 PM      Result Value Ref Range   Glucose-Capillary 139 (*) 70 - 99 mg/dL  GLUCOSE, CAPILLARY     Status: None   Collection Time    11/19/13  7:34 AM      Result Value Ref Range   Glucose-Capillary 79  70 - 99 mg/dL      HENT:  Head: Normocephalic and atraumatic.  Edentulous  Eyes:  Pupils are equal, round, and reactive to light.  Neck: Normal range of motion. Neck supple.  Cardiovascular: Normal rate and regular rhythm.  Respiratory: Effort normal and breath sounds normal.  GI: Soft. Bowel sounds are normal. She exhibits no distension. There is no tenderness.  Musculoskeletal: She exhibits edema (pedal edema ).  RA changes bilateral hands and feet. Well healed old B-TKR and hammer toe repair incisions.  Neurological: She is alert.  Oriented to self and place. Able to follow simple one step commands. Fair awareness Mild left central 7. RUE 4- to 4/5 prox to distal with weakness with grip to RA. LUE 3+ to 4/5. LLE 3-/5 HF, KE, 3- ankles. RLE 4 /5. No sensory deficits  Skin: Skin is warm and dry.  Slowed responses, oriented to person and place  Needs verbal and physical cues for manual muscle testing with repeated instructions   Assessment/Plan: 1. Functional deficits secondary to Right caudate ICH which require 3+ hours per day of interdisciplinary therapy in a comprehensive inpatient rehab setting. Physiatrist is providing close team supervision and 24 hour management of active medical problems listed below. Physiatrist and rehab team continue to assess barriers to discharge/monitor patient progress toward functional and medical goals. FIM:  FIM - Bathing Bathing Steps Patient Completed: Left Arm;Right Arm;Front perineal area;Chest Bathing: 2: Max-Patient completes 3-4 27f 10 parts or 25-49%  FIM - Upper Body Dressing/Undressing Upper body dressing/undressing steps patient completed: Thread/unthread right bra strap;Thread/unthread left bra strap;Thread/unthread right sleeve of pullover shirt/dresss Upper body dressing/undressing: 2: Max-Patient completed 25-49% of tasks FIM - Lower Body Dressing/Undressing Lower body dressing/undressing steps patient completed: Thread/unthread right pants leg Lower body dressing/undressing: 2: Max-Patient completed 25-49% of  tasks  FIM - Hotel manager Devices: Grab bar or rail for support Toileting: 0: Activity did not occur  FIM - Diplomatic Services operational officer Devices: Grab bars Toilet Transfers: 3-To toilet/BSC: Mod A (lift or lower assist);3-From toilet/BSC: Mod A (lift or lower assist)  FIM - Banker Devices: Walker;Arm rests;Bed rails;HOB elevated Bed/Chair Transfer: 4: Chair or W/C > Bed: Min A (steadying Pt. > 75%);4: Bed > Chair or W/C: Min A (steadying Pt. > 75%)  FIM - Locomotion: Wheelchair Distance: 20 Locomotion: Wheelchair: 1: Travels less than 50 ft with moderate assistance (Pt: 50 - 74%) FIM - Locomotion: Ambulation Locomotion: Ambulation Assistive Devices: Designer, industrial/product Ambulation/Gait Assistance: 3: Mod assist Locomotion: Ambulation: 1: Travels less than 50 ft with moderate assistance (Pt: 50 - 74%)  Comprehension Comprehension Mode: Auditory Comprehension: 3-Understands basic 50 - 74% of the time/requires cueing 25 - 50%  of the time  Expression Expression Mode: Verbal Expression: 2-Expresses basic 25 - 49% of the time/requires cueing 50 - 75% of the time. Uses single words/gestures.  Social Interaction Social Interaction: 3-Interacts appropriately 50 - 74% of the time - May be physically or verbally inappropriate.  Problem Solving Problem Solving: 1-Solves basic less than 25% of the time - needs direction nearly all the time or does not effectively solve problems and may need a restraint for safety  Memory Memory: 1-Recognizes or recalls less than 25% of the time/requires cueing greater than 75% of the time  Medical Problem List and Plan:  1. Functional deficits secondary to Right caudate ICH  2. PE/ H/o DVT/Anticoagulation: Mechanical: Antiembolism stockings, knee (TED hose) Bilateral lower extremities  Sequential compression devices, below knee Bilateral lower extremities  3. Pain Management:    Vicodin every 6 hours prn  4. Mood: Continue Remeron at bedtime. LCSW to follow for evaluation and support.  5. Mild dementia/Neuropsych: Did not tolerate Aricept trial due to SE. This patient is not capable of making decisions on her own behalf.  6. HTN: Will monitor BP every 8 hours. Continue cardizem tid at 30mg  due to bradycardia--may need to reduce further if persistent. 7. CKD: Baseline Cr-1.4 at admission. Improved to 1.08, K+ replacement 8. RA:   continue chronic steroids. 9. ?hyperglycemia: sugars normal---dc checks   LOS (Days) 5 A FACE TO FACE EVALUATION WAS PERFORMED  11/21/2013, 6:50 AM

## 2013-11-22 ENCOUNTER — Inpatient Hospital Stay (HOSPITAL_COMMUNITY): Payer: Medicare Other

## 2013-11-22 ENCOUNTER — Inpatient Hospital Stay (HOSPITAL_COMMUNITY): Payer: Medicare Other | Admitting: Occupational Therapy

## 2013-11-22 ENCOUNTER — Encounter (HOSPITAL_COMMUNITY): Payer: Medicare Other | Admitting: Occupational Therapy

## 2013-11-22 ENCOUNTER — Inpatient Hospital Stay (HOSPITAL_COMMUNITY): Payer: Medicare Other | Admitting: *Deleted

## 2013-11-22 MED ORDER — METHYLPHENIDATE HCL 5 MG PO TABS
5.0000 mg | ORAL_TABLET | Freq: Two times a day (BID) | ORAL | Status: DC
Start: 2013-11-22 — End: 2013-11-28
  Administered 2013-11-23 – 2013-11-28 (×12): 5 mg via ORAL
  Filled 2013-11-22 (×13): qty 1

## 2013-11-22 NOTE — Patient Care Conference (Signed)
Inpatient RehabilitationTeam Conference and Plan of Care Update Date: 11/22/2013   Time: 10;30 AM    Patient Name: Shelly [name]      Medical Record Number: [id number]  Date of Birth: 02-Nov-1918 Sex: Female         Room/Bed: 4W20C/4W20C-01 Payor Info: Payor: MEDICARE / Plan: MEDICARE PART A AND B / Product Type: *No Product type* /    Admitting Diagnosis: ICH  Admit Date/Time:  11/16/2013  6:34 PM Admission Comments: No comment available   Primary Diagnosis:  ICH (intracerebral hemorrhage) Principal Problem: ICH (intracerebral hemorrhage)  Patient Active Problem List   Diagnosis Date Noted  . Chronic kidney disease 11/22/2013  . Intracranial bleed 11/13/2013  . ICH (intracerebral hemorrhage) 11/13/2013  . Other pulmonary embolism and infarction 04/11/2013  . Syncope 10/21/2012  . History of pulmonary embolism 10/21/2012  . Rheumatoid arthritis(714.0) 10/21/2012  . Hypertension 10/21/2012  . Fever 10/21/2012    Expected Discharge Date: Expected Discharge Date: 11/29/13  Team Members Present: Physician leading conference: Dr. Claudette Laws Social Worker Present: Dossie Der, LCSW Nurse Present: Darnelle Bos, RN PT Present: Wanda Plump, PT;Blair Hobble, PT OT Present: Bretta Bang, OT SLP Present: Fae Pippin, SLP PPS Coordinator present : Edson Snowball, Chapman Fitch, RN, CRRN     Current Status/Progress Goal Weekly Team Focus  Medical   cont bowel and bladder loose stools, cog deficits  cont bowel and bladder  med management asessment of diarrhea   Bowel/Bladder   Pt continet during the day, and incontinent during the night.  Manage bowel and bladder with minimal asist.  Routine tolieting HS   Swallow/Nutrition/ Hydration   dys 2 textures, thin liquids, no straws, intermittent supervision   supervision for least restrictive diet  diet tolerance and advancement    ADL's   max A toileting, dressing; min Bathing and toilet transfers  min A  ADL retraining,  functional mobility, balance, cognitive activities, pt/family education   Mobility   Min A basic transfers, Min A gait with RW x20-40,' Max A stairs  Supervision transfers and gait with RW x100,' Min A stairs   activity tolerance, strength, gait with RW, balance, and stairs   Communication   n/a  n/a  n/a   Safety/Cognition/ Behavioral Observations  mod-max assist   min assist  improve carryover for safety awareness and problem solving   Pain   Tylenol 325-650 q4h prn for mild pain, pt has arthritis.  3 or <  Q shift assessment of pt pain.   Skin   CDI            *See Care Plan and progress notes for long and short-term goals.  Barriers to Discharge: chronic RA joint changes    Possible Resolutions to Barriers:  adaptive equip, ritalin trial    Discharge Planning/Teaching Needs:  HOme with daughter and her husband-daughter can provide 24 hr care.  here daily and observing in therapies      Team Discussion:  More alert today-severe RA limits Bathing and dressing abilities.  Ritalin trial-attention poor.  Daughter to assist at discharge  Revisions to Treatment Plan:  None   Continued Need for Acute Rehabilitation Level of Care: The patient requires daily medical management by a physician with specialized training in physical medicine and rehabilitation for the following conditions: Daily direction of a multidisciplinary physical rehabilitation program to ensure safe treatment while eliciting the highest outcome that is of practical value to the patient.: Yes Daily medical management of patient stability for  increased activity during participation in an intensive rehabilitation regime.: Yes Daily analysis of laboratory values and/or radiology reports with any subsequent need for medication adjustment of medical intervention for : Neurological problems;Other  [name] Tayquan Gassman 11/22/2013, 4:06 PM

## 2013-11-22 NOTE — Progress Notes (Signed)
Occupational Therapy Session Note  Patient Details  Name: Shelly [name] MRN: [id number] Date of Birth: 1919/03/07  Today's Date: 11/22/2013 Time: 1440-1540 Time Calculation (min): 60 min  Skilled Therapeutic Interventions/Progress Updates:    Pt worked on sit to stand and standing endurance to begin session.  She was able to stand with min assist and locate various clothespins placed on her left side.  Pt unable to manipulate clothespins with her LUE to place on the vertical pole but she could pick them up from the container and then place them with the RUE.  Pt with arthritic changes in her digits of the left hand.  Progressed to having her work on standing to fold towels, washcloth, and pillowcases.  Pt stood in 4 intervals of 2-3 mins each to complete folding 5 items.  Pt needing max instructional cueing to push up from the wheelchair with sit to stand as well as for backing up and reaching back for the chair to sit down.  Min assist for all sit to stand intervals.  Concluded session by having pt attempt bed transfer in the apartment.  Pt needed max coaxing to participate and once the transferred to the edge of the bed she refused to lay down.  Max instructional cueing needed to transfer back to the wheelchair.  Unsure if pt did not understand task she was asked to do as her verbal responses were "no" "I don't think I want to do that."  Notified pt's daughter of pt's performance.   Therapy Documentation Precautions:  Precautions Precautions: Fall Precaution Comments: RA; generalized weakness, history of dementia Restrictions Weight Bearing Restrictions: No  Pain:  No report of pain during session  ADL: See FIM for current functional status  Therapy/Group: Individual Therapy  Delon Sacramento OTR/L 11/22/2013, 4:32 PM

## 2013-11-22 NOTE — Progress Notes (Signed)
Social Work Shelly Chris, LCSW Social Worker Signed  Patient Care Conference Service date: 11/22/2013 4:06 PM  Inpatient RehabilitationTeam Conference and Plan of Care Update Date: 11/22/2013   Time: 10;30 AM     Patient Name: Shelly [name]       Medical Record Number: [id number]   Date of Birth: April 28, 1919 Sex: Female         Room/Bed: 4W20C/4W20C-01 Payor Info: Payor: MEDICARE / Plan: MEDICARE PART A AND B / Product Type: *No Product type* /   Admitting Diagnosis: ICH   Admit Date/Time:  11/16/2013  6:34 PM Admission Comments: No comment available   Primary Diagnosis:  ICH (intracerebral hemorrhage) Principal Problem: ICH (intracerebral hemorrhage)    Patient Active Problem List     Diagnosis  Date Noted   .  Chronic kidney disease  11/22/2013   .  Intracranial bleed  11/13/2013   .  ICH (intracerebral hemorrhage)  11/13/2013   .  Other pulmonary embolism and infarction  04/11/2013   .  Syncope  10/21/2012   .  History of pulmonary embolism  10/21/2012   .  Rheumatoid arthritis(714.0)  10/21/2012   .  Hypertension  10/21/2012   .  Fever  10/21/2012     Expected Discharge Date: Expected Discharge Date: 11/29/13  Team Members Present: Physician leading conference: Dr. Claudette Laws Social Worker Present: Dossie Der, LCSW Nurse Present: Darnelle Bos, RN PT Present: Wanda Plump, PT;Blair Hobble, PT OT Present: Bretta Bang, OT SLP Present: Fae Pippin, SLP PPS Coordinator present : Edson Snowball, Chapman Fitch, RN, CRRN        Current Status/Progress  Goal  Weekly Team Focus   Medical     cont bowel and bladder loose stools, cog deficits  cont bowel and bladder  med management asessment of diarrhea   Bowel/Bladder     Pt continet during the day, and incontinent during the night.  Manage bowel and bladder with minimal asist.  Routine tolieting HS   Swallow/Nutrition/ Hydration     dys 2 textures, thin liquids, no straws, intermittent supervision    supervision for least restrictive diet  diet tolerance and advancement    ADL's     max A toileting, dressing; min Bathing and toilet transfers  min A  ADL retraining, functional mobility, balance, cognitive activities, pt/family education   Mobility     Min A basic transfers, Min A gait with RW x20-40,' Max A stairs  Supervision transfers and gait with RW x100,' Min A stairs   activity tolerance, strength, gait with RW, balance, and stairs   Communication     n/a  n/a  n/a   Safety/Cognition/ Behavioral Observations    mod-max assist   min assist  improve carryover for safety awareness and problem solving   Pain     Tylenol 325-650 q4h prn for mild pain, pt has arthritis.  3 or <  Q shift assessment of pt pain.   Skin     CDI         *See Care Plan and progress notes for long and short-term goals.    Barriers to Discharge:  chronic RA joint changes      Possible Resolutions to Barriers:    adaptive equip, ritalin trial      Discharge Planning/Teaching Needs:    HOme with daughter and her husband-daughter can provide 24 hr care.  here daily and observing in therapies      Team Discussion:    More  alert today-severe RA limits Bathing and dressing abilities.  Ritalin trial-attention poor.  Daughter to assist at discharge   Revisions to Treatment Plan:    None    Continued Need for Acute Rehabilitation Level of Care: The patient requires daily medical management by a physician with specialized training in physical medicine and rehabilitation for the following conditions: Daily direction of a multidisciplinary physical rehabilitation program to ensure safe treatment while eliciting the highest outcome that is of practical value to the patient.: Yes Daily medical management of patient stability for increased activity during participation in an intensive rehabilitation regime.: Yes Daily analysis of laboratory values and/or radiology reports with any subsequent need for medication  adjustment of medical intervention for : Neurological problems;Other  Shelly [name] Shelly [name] 11/22/2013, 4:06 PM          Patient ID: Shelly [name], female   DOB: [birth year], 80 y.o.   MRN: [id number]

## 2013-11-22 NOTE — Progress Notes (Signed)
Social Work Patient ID: Shelly [name], female   DOB: 16-Nov-1918, 80 y.o.   MRN: [id number] Met with pt and daughter to inform of team conference goals-supervision/min level and discharge 5/27.  Pt nodded yes she would be ready to return home, daughter agreeable. Discussed follow up and DME will let know when team informs me.  Daughter to be here and participate in therapies with pt.  Work toward discharge next [employer].

## 2013-11-22 NOTE — Progress Notes (Signed)
Physical Therapy Session Note  Patient Details  Name: JAYLENA HOLLOWAY MRN: [id number] Date of Birth: 1919-02-15  Today's Date: 11/22/2013 Time: [phone], 3335-4562 Time Calculation (min): 57 min, 45 min  Short Term Goals: Week 1:  PT Short Term Goal 1 (Week 1): STG=LTG due to LOS, Supervision overall for transfers and gait, Min A 2 stairs 1 rail    Skilled Therapeutic Interventions/Progress Updates:  Tx 1:  Pt with improved attentionTx 1 focused on gait in room and toilet transfers.  Gait with RW x 25' in quiet room, min guard/supervison.  After ambulating 10', pt reported that she needed to use toilet.  She was continent of B and B on BSC over toilet.  Gait to return to w/c.  Pt left sitting up in w/c; all needs within reach and quick release belt applied.    Tx 2:  Pt finishing up with toilet transfer with NT, min assist to R.  Pt washed hands in sitting in w/c with cues and assistance to attend to L for items.  Gait with RW x 30' x 20'  with min guard assist.  Up small ramp and up 3 steps with Bil rails, min guard assist; down 3 steps sideways with 1 rail to simulate home situation, max VCS.  When pt reached small ramp at bottom of steps, she became confused, tried to sit down and required +2 assist to prevent fall.  Advanced gait weaving in/out of cones x 20' with min guard assist, max cues but still encountering 4/6 cones on L due to inattention.  Discussed L inattention and distractions that may be present in the home, with daughter [name] who observed.  Simulated car transfer with min assist, mod cues for techinque. Therapy Documentation Precautions:  Precautions Precautions: Fall Precaution Comments: RA; generalized weakness Restrictions Weight Bearing Restrictions: No General:   Vital Signs: Therapy Vitals BP: 148/70 mmHg Pain: Pain Assessment Faces Pain Scale: Hurts a little bit Pain Location: Head Pain Intervention(s): Medication (See eMAR) Mobility:    Locomotion :    Trunk/Postural Assessment :    Balance:   Exercises:   Other Treatments:    See FIM for current functional status  Therapy/Group: Individual Therapy  Susa Loffler 11/22/2013, 11:17 AM

## 2013-11-22 NOTE — Progress Notes (Signed)
Occupational Therapy Session Note  Patient Details  Name: Shelly [name] MRN: [id number] Date of Birth: [birth year]  Today's Date: 11/22/2013 Time: 7035-0093 Time Calculation (min): 57 min  Short Term Goals: Week 1:  OT Short Term Goal 1 (Week 1): Pt will transfer to toilet with min A using grab bars in a squat/ stand pivot. OT Short Term Goal 2 (Week 1): Pt will don a shirt with min A. OT Short Term Goal 3 (Week 1): Pt will bathe with min A. OT Short Term Goal 4 (Week 1): Pt will stand with mod A to allow her to pull her pants up.  Skilled Therapeutic Interventions/Progress Updates:    Pt seen for individual OT treatment session for basic ADL/self care skills w/ focus on functional transfers (sit to stand, SPT), activity tolerance, task initiation, dressing techniques, functional use of LUE. Pt was noted to be incontinent of bowel/bladder upon OT arrival. Pt was steady/min assist sit to stand from EOB & w/ forward lean in standing w/ RW despite verbal and tactile cues to correct. Pt was able to stand at sink to clean her after incontinence noted (she assisted w/ peri care in sitting). Pt unable to initiate grooming tasks, however given multimodal cues she did better. Pt was sitting up in w/c w/ qucik release belt on at conclusion of treatment session w/ RN staff in her room, call bell/phone in reach.  Therapy Documentation Precautions:  Precautions Precautions: Fall Precaution Comments: Generalzied we Restrictions Weight Bearing Restrictions: No General:   Vital Signs: Therapy Vitals Temp: 97.6 F (36.4 C) Temp src: Oral Pulse Rate: 94 Resp: 19 BP: 148/70 mmHg Patient Position (if appropriate): Lying Oxygen Therapy SpO2: 99 % O2 Device: None (Room air) Pain:  Pt reports head ache over left eye, states that she was given medication earlier by RN. Did not rate. F/U w/ RN to confirm. ADL: ADL ADL Comments: Refer to FIM     See FIM for current functional  status  Therapy/Group: Individual Therapy  Alexiz Sustaita B Shyquan Stallbaumer 11/22/2013, 9:29 AM

## 2013-11-22 NOTE — Progress Notes (Signed)
Subjective/Complaints: 80 y.o. right-handed female with history of bilateral TKR, hypertension, baseline dementia, RA with chronic prednisone, DVT with chronic Coumadin and chronic renal insufficiency baseline creatinine 1.46. Per reports on 11/11/13, patient attempted to turn the light off, and was found face down on the floor but without any injuries.  Doesn't remember me Woke up with HA   slept ok  ROS limited by cognitive status Objective: Vital Signs: Blood pressure 129/65, pulse 94, temperature 97.6 F (36.4 C), temperature source Oral, resp. rate 19, height '5\' 1"'  (1.549 m), weight 69.1 kg (152 lb 5.4 oz), SpO2 99.00%. No results found. Results for orders placed during the hospital encounter of 11/16/13 (from the past 72 hour(s))  GLUCOSE, CAPILLARY     Status: None   Collection Time    11/19/13  7:34 AM      Result Value Ref Range   Glucose-Capillary 79  70 - 99 mg/dL  BASIC METABOLIC PANEL     Status: Abnormal   Collection Time    11/21/13  7:55 AM      Result Value Ref Range   Sodium 137  137 - 147 mEq/L   Potassium 4.0  3.7 - 5.3 mEq/L   Chloride 100  96 - 112 mEq/L   CO2 25  19 - 32 mEq/L   Glucose, Bld 93  70 - 99 mg/dL   BUN 9  6 - 23 mg/dL   Creatinine, Ser 1.09  0.50 - 1.10 mg/dL   Calcium 9.3  8.4 - 10.5 mg/dL   GFR calc non Af Amer 43 (*) >90 mL/min   GFR calc Af Amer 49 (*) >90 mL/min   Comment: (NOTE)     The eGFR has been calculated using the CKD EPI equation.     This calculation has not been validated in all clinical situations.     eGFR's persistently <90 mL/min signify possible Chronic Kidney     Disease.      HENT:  Head: Normocephalic and atraumatic.  Edentulous  Eyes: Pupils are equal, round, and reactive to light.  Neck: Normal range of motion. Neck supple.  Cardiovascular: Normal rate and regular rhythm.  Respiratory: Effort normal and breath sounds normal.  GI: Soft. Bowel sounds are normal. She exhibits no distension. There is no  tenderness.  Musculoskeletal: She exhibits edema (pedal edema ).  RA changes bilateral hands and feet. Well healed old B-TKR and hammer toe repair incisions.  Neurological: She is alert.  Oriented to self and place. Able to follow simple one step commands. Fair awareness Mild left central 7. RUE 4- to 4/5 prox to distal with weakness with grip to RA. LUE 3+ to 4/5. LLE 3-/5 HF, KE, 3- ankles. RLE 4 /5. No sensory deficits  Skin: Skin is warm and dry.  Slowed responses, oriented to person and place  Needs verbal and physical cues for manual muscle testing with repeated instructions   Assessment/Plan: 1. Functional deficits secondary to Right caudate ICH which require 3+ hours per day of interdisciplinary therapy in a comprehensive inpatient rehab setting. Physiatrist is providing close team supervision and 24 hour management of active medical problems listed below. Physiatrist and rehab team continue to assess barriers to discharge/monitor patient progress toward functional and medical goals. FIM: FIM - Bathing Bathing Steps Patient Completed: Left Arm;Right Arm;Front perineal area;Chest;Abdomen;Buttocks;Right upper leg;Left upper leg Bathing: 4: Min-Patient completes 8-9 27f10 parts or 75+ percent  FIM - Upper Body Dressing/Undressing Upper body dressing/undressing steps patient completed: Thread/unthread right  bra strap;Thread/unthread left bra strap;Thread/unthread right sleeve of pullover shirt/dresss;Thread/unthread left sleeve of pullover shirt/dress Upper body dressing/undressing: 3: Mod-Patient completed 50-74% of tasks FIM - Lower Body Dressing/Undressing Lower body dressing/undressing steps patient completed: Thread/unthread right pants leg;Thread/unthread left pants leg Lower body dressing/undressing: 2: Max-Patient completed 25-49% of tasks  FIM - Toileting Toileting steps completed by patient: Performs perineal hygiene Toileting Assistive Devices: Grab bar or rail for  support Toileting: 2: Max-Patient completed 1 of 3 steps  FIM - Radio producer Devices: Grab bars Toilet Transfers: 4-From toilet/BSC: Min A (steadying Pt. > 75%);4-To toilet/BSC: Min A (steadying Pt. > 75%)  FIM - Control and instrumentation engineer Devices: Walker;Arm rests Bed/Chair Transfer: 4: Bed > Chair or W/C: Min A (steadying Pt. > 75%);4: Chair or W/C > Bed: Min A (steadying Pt. > 75%)  FIM - Locomotion: Wheelchair Distance: 25 Locomotion: Wheelchair: 1: Travels less than 50 ft with minimal assistance (Pt.>75%) FIM - Locomotion: Ambulation Locomotion: Ambulation Assistive Devices: Administrator Ambulation/Gait Assistance: 4: Min assist Locomotion: Ambulation: 1: Travels less than 50 ft with minimal assistance (Pt.>75%)  Comprehension Comprehension Mode: Auditory Comprehension: 4-Understands basic 75 - 89% of the time/requires cueing 10 - 24% of the time  Expression Expression Mode: Verbal Expression: 3-Expresses basic 50 - 74% of the time/requires cueing 25 - 50% of the time. Needs to repeat parts of sentences.  Social Interaction Social Interaction: 3-Interacts appropriately 50 - 74% of the time - May be physically or verbally inappropriate.  Problem Solving Problem Solving: 2-Solves basic 25 - 49% of the time - needs direction more than half the time to initiate, plan or complete simple activities  Memory Memory: 2-Recognizes or recalls 25 - 49% of the time/requires cueing 51 - 75% of the time  Medical Problem List and Plan:  1. Functional deficits secondary to Right caudate ICH  2. PE/ H/o DVT/Anticoagulation: Mechanical: Antiembolism stockings, knee (TED hose) Bilateral lower extremities  Sequential compression devices, below knee Bilateral lower extremities  3. Pain Management:   Vicodin every 6 hours prn  4. Mood: Continue Remeron at bedtime. LCSW to follow for evaluation and support.  5. Mild dementia/Neuropsych:  Did not tolerate Aricept trial due to SE. This patient is not capable of making decisions on her own behalf.  6. HTN: Will monitor BP every 8 hours. Continue cardizem tid at 32m due to bradycardia--may need to reduce further if persistent. 7. CKD: Baseline Cr-1.4 at admission. Improved to 1.08, K+ replacement 8. RA:   continue chronic steroids. 9. ?hyperglycemia: sugars normal---dc checks   LOS (Days) 6 A FACE TO FACE EVALUATION WAS PERFORMED  ACharlett Blake5/20/2015, 7:23 AM

## 2013-11-23 ENCOUNTER — Inpatient Hospital Stay (HOSPITAL_COMMUNITY): Payer: Medicare Other

## 2013-11-23 ENCOUNTER — Ambulatory Visit (HOSPITAL_COMMUNITY): Payer: Medicare Other | Admitting: Speech Pathology

## 2013-11-23 ENCOUNTER — Encounter (HOSPITAL_COMMUNITY): Payer: Medicare Other | Admitting: Occupational Therapy

## 2013-11-23 DIAGNOSIS — I619 Nontraumatic intracerebral hemorrhage, unspecified: Secondary | ICD-10-CM

## 2013-11-23 NOTE — Progress Notes (Signed)
Occupational Therapy Session Note  Patient Details  Name: Shelly [name] MRN: [id number] Date of Birth: 01-20-1919  Today's Date: 11/23/2013 Time: 4332-9518 Time Calculation (min): 45 min  Short Term Goals: Week 1:  OT Short Term Goal 1 (Week 1): Pt will transfer to toilet with min A using grab bars in a squat/ stand pivot. OT Short Term Goal 2 (Week 1): Pt will don a shirt with min A. OT Short Term Goal 3 (Week 1): Pt will bathe with min A. OT Short Term Goal 4 (Week 1): Pt will stand with mod A to allow her to pull her pants up.  Skilled Therapeutic Interventions/Progress Updates:  Patient received supine in bed with HOB raised finishing breakfast. Patient with complaints, "I don't feel very good today". Therapist asked patient what was wrong and if she was having any pain, patient unable to verbalize what was wrong; RN made aware of this. Patient engaged in bed mobility, sat EOB, and transferred EOB>w/c with minimal assistance. Patient completed UB/LB bathing & dressing in sit<>stand position at sink.  Focused skilled intervention on initiation, problem solving, ADL retraining, sit<>stands, activity tolerance/endurance, functional use of LUE. At end of session, left patient seated in w/c with quick release belt donned and all needs within reach.   Precautions:  Precautions Precautions: Fall Precaution Comments: RA; generalized weakness, history of dementia Restrictions Weight Bearing Restrictions: No  See FIM for current functional status  Therapy/Group: Individual Therapy  Coralyn Mark 11/23/2013, 10:06 AM

## 2013-11-23 NOTE — Progress Notes (Signed)
Speech Language Pathology Daily Session Note  Patient Details  Name: Shelly [name] MRN: [id number] Date of Birth: April 27, 1919  Today's Date: 11/23/2013 Time: 1130-1215 Time Calculation (min): 45 min  Short Term Goals: Week 1: SLP Short Term Goal 1 (Week 1): Pt will improve mastication and oral manipulation of solids with min assist-supervision cues for swallowing safety during trials of upgraded dys. 3 and/or regular solids.   SLP Short Term Goal 2 (Week 1): Pt will tolerate dys. 2 solids and thin liquids during functional meals and/or snacks with no overt s/sx of aspiration with min assist-supervision cues for swallowing safety.   SLP Short Term Goal 3 (Week 1): Pt will improve her safety awareness during functional tasks for 80% accuracy and min assist.    Skilled Therapeutic Interventions: Pt was seen for skilled speech therapy targeting dysphagia management.  Pt was observed with presentations of her prescribed diet and was noted with significantly prolonged mastication and decreased oral manipulation of dys. 2 textures resulting in moderate oral residue.  Pt benefitted from frequent cuing to attend to bolus during the oral phase of her swallow secondary to sustained attention impairments and was noted with improved oral clearance of residue with a thin liquid wash.  Pt was noted with improved mastication and oral clearance of residue during trials of regular solids which SLP suspects was related to taste preference as pt exhibited more timely anterior-posterior transit of solids.  Recommend that pt continue dys 2 textures with thin liquids.  SLP will continue to follow for diet tolerance and advancement.     FIM:  Comprehension Comprehension Mode: Auditory Comprehension: 4-Understands basic 75 - 89% of the time/requires cueing 10 - 24% of the time Expression Expression Mode: Verbal Expression: 3-Expresses basic 50 - 74% of the time/requires cueing 25 - 50% of the time. Needs to repeat parts  of sentences. Social Interaction Social Interaction: 4-Interacts appropriately 75 - 89% of the time - Needs redirection for appropriate language or to initiate interaction. Problem Solving Problem Solving: 3-Solves basic 50 - 74% of the time/requires cueing 25 - 49% of the time Memory Memory: 3-Recognizes or recalls 50 - 74% of the time/requires cueing 25 - 49% of the time FIM - Eating Eating Activity: 5: Set-up assist for open containers;5: Supervision/cues  Pain Pain Assessment Pain Assessment: No/denies pain  Therapy/Group: Individual Therapy  Jackalyn Lombard, M.A. CCC-SLP  Melanee Spry Icyss Skog 11/23/2013, 6:08 PM

## 2013-11-23 NOTE — Progress Notes (Signed)
Physical Therapy Session Note  Patient Details  Name: Shelly [name] MRN: [id number] Date of Birth: Jan 03, 1919  Today's Date: 11/23/2013 [id number],  1400-1500 Time Calculation (min):45 ,  60 min  Short Term Goals: Week 1:  PT Short Term Goal 1 (Week 1): STG=LTG due to LOS, Supervision overall for transfers and gait, Min A 2 stairs 1 rail     Skilled Therapeutic Interventions/Progress Updates:   Tx 1:Attempted gait in quiet environment, but pt stated she needed to use toilet.  Toilet transfer. Pt incontinent of bowel, had diarrhea.  Pt c/o abdominal pain, conveyed to RN.  Pt left sitting up in w/c, quick release belt in place and all needs within reach.  Tx 2:Gait group focusing on use of Rw with advanced gait with min assist: stepping over obstacles, ambulation with cone-obstacles on L, sidestepping, 4 steps with use of R rail and RW, as pt did previously at home, to ascend, and  L rail with bil hands on rail, pt turned sideways, to descend.    Pt was safe and receptive to instruction on steps today.   Gait x 50'  with RW, close supervision.    Daughter Shelly [name] observed group session.     Therapy Documentation Precautions:  Precautions Precautions: Fall Precaution Comments: RA; generalized weakness, history of dementia Restrictions Weight Bearing Restrictions: No   Vital Signs: Pulse Rate: 78 BP: 134/70 mmHg Patient Position (if appropriate): Sitting      See FIM for current functional status  Therapy/Group: Individual Therapy and group therapy  Susa Loffler 11/23/2013, 4:44 PM

## 2013-11-23 NOTE — Progress Notes (Signed)
Subjective/Complaints: 80 y.o. right-handed female with history of bilateral TKR, hypertension, baseline dementia, RA with chronic prednisone, DVT with chronic Coumadin and chronic renal insufficiency baseline creatinine 1.46. Per reports on 11/11/13, patient attempted to turn the light off, and was found face down on the floor but without any injuries.   "at home", R hand pain, shoulder pain with overhead activity  ROS limited by cognitive status Objective: Vital Signs: Blood pressure 105/48, pulse 85, temperature 99.2 F (37.3 C), temperature source Oral, resp. rate 17, height '5\' 1"'  (1.549 m), weight 69.1 kg (152 lb 5.4 oz), SpO2 95.00%. No results found. Results for orders placed during the hospital encounter of 11/16/13 (from the past 72 hour(s))  BASIC METABOLIC PANEL     Status: Abnormal   Collection Time    11/21/13  7:55 AM      Result Value Ref Range   Sodium 137  137 - 147 mEq/L   Potassium 4.0  3.7 - 5.3 mEq/L   Chloride 100  96 - 112 mEq/L   CO2 25  19 - 32 mEq/L   Glucose, Bld 93  70 - 99 mg/dL   BUN 9  6 - 23 mg/dL   Creatinine, Ser 1.09  0.50 - 1.10 mg/dL   Calcium 9.3  8.4 - 10.5 mg/dL   GFR calc non Af Amer 43 (*) >90 mL/min   GFR calc Af Amer 49 (*) >90 mL/min   Comment: (NOTE)     The eGFR has been calculated using the CKD EPI equation.     This calculation has not been validated in all clinical situations.     eGFR's persistently <90 mL/min signify possible Chronic Kidney     Disease.      HENT:  Head: Normocephalic and atraumatic.  Edentulous  Eyes: Pupils are equal, round, and reactive to light.  Neck: Normal range of motion. Neck supple.  Cardiovascular: Normal rate and regular rhythm.  Respiratory: Effort normal and breath sounds normal.  GI: Soft. Bowel sounds are normal. She exhibits no distension. There is no tenderness.  Musculoskeletal: She exhibits edema (pedal edema ).  RA joint deformities bilateral hands and feet. Well healed old B-TKR and  hammer toe repair incisions.  Neurological: She is alert.  Oriented to self and place. Able to follow simple one step commands. Fair awareness Mild left central 7. RUE 4- to 4/5 prox to distal with weakness with grip to RA. LUE 3+ to 4/5. LLE 3-/5 HF, KE, 3- ankles. RLE 4 /5. No sensory deficits  Skin: Skin is warm and dry.  Slowed responses, oriented to person and place  Needs verbal and physical cues for manual muscle testing with repeated instructions   Assessment/Plan: 1. Functional deficits secondary to Right caudate ICH which require 3+ hours per day of interdisciplinary therapy in a comprehensive inpatient rehab setting. Physiatrist is providing close team supervision and 24 hour management of active medical problems listed below. Physiatrist and rehab team continue to assess barriers to discharge/monitor patient progress toward functional and medical goals. FIM: FIM - Bathing Bathing Steps Patient Completed: Chest;Right Arm;Left Arm;Abdomen;Front perineal area;Right upper leg;Left upper leg Bathing: 4: Min-Patient completes 8-9 42f10 parts or 75+ percent  FIM - Upper Body Dressing/Undressing Upper body dressing/undressing steps patient completed: Thread/unthread right bra strap;Thread/unthread left bra strap;Thread/unthread right sleeve of pullover shirt/dresss;Thread/unthread left sleeve of pullover shirt/dress Upper body dressing/undressing: 3: Mod-Patient completed 50-74% of tasks FIM - Lower Body Dressing/Undressing Lower body dressing/undressing steps patient completed: Thread/unthread right  pants leg;Thread/unthread left pants leg;Don/Doff right sock;Don/Doff left sock Lower body dressing/undressing: 2: Max-Patient completed 25-49% of tasks  FIM - Toileting Toileting steps completed by patient: Performs perineal hygiene (Pt had BM/urinated in diaper & was unaware) Toileting Assistive Devices: Grab bar or rail for support Toileting: 2: Max-Patient completed 1 of 3  steps  FIM - Radio producer Devices: Grab bars Toilet Transfers: 4-From toilet/BSC: Min A (steadying Pt. > 75%);4-To toilet/BSC: Min A (steadying Pt. > 75%)  FIM - Control and instrumentation engineer Devices: Walker;Arm rests Bed/Chair Transfer: 4: Chair or W/C > Bed: Min A (steadying Pt. > 75%);4: Bed > Chair or W/C: Min A (steadying Pt. > 75%)  FIM - Locomotion: Wheelchair Distance: 25 Locomotion: Wheelchair: 1: Total Assistance/staff pushes wheelchair (Pt<25%) FIM - Locomotion: Ambulation Locomotion: Ambulation Assistive Devices: Administrator Ambulation/Gait Assistance: 4: Min assist Locomotion: Ambulation: 1: Travels less than 50 ft with minimal assistance (Pt.>75%)  Comprehension Comprehension Mode: Auditory Comprehension: 4-Understands basic 75 - 89% of the time/requires cueing 10 - 24% of the time  Expression Expression Mode: Verbal Expression: 3-Expresses basic 50 - 74% of the time/requires cueing 25 - 50% of the time. Needs to repeat parts of sentences.  Social Interaction Social Interaction: 3-Interacts appropriately 50 - 74% of the time - May be physically or verbally inappropriate.  Problem Solving Problem Solving: 2-Solves basic 25 - 49% of the time - needs direction more than half the time to initiate, plan or complete simple activities  Memory Memory: 2-Recognizes or recalls 25 - 49% of the time/requires cueing 51 - 75% of the time  Medical Problem List and Plan:  1. Functional deficits secondary to Right caudate ICH  2. PE/ H/o DVT/Anticoagulation: Mechanical: Antiembolism stockings, knee (TED hose) Bilateral lower extremities  Sequential compression devices, below knee Bilateral lower extremities  3. Pain Management:   Vicodin every 6 hours prn  4. Mood: Continue Remeron at bedtime. LCSW to follow for evaluation and support.  5. Mild dementia/Neuropsych: Did not tolerate Aricept trial due to SE. This patient is not  capable of making decisions on her own behalf.  6. HTN: Will monitor BP every 8 hours. Continue cardizem tid at 22m due to bradycardia--may need to reduce further if persistent. 7. CKD: Baseline Cr-1.4 at admission. Improved to 1.08, K+ replacement 8. RA:   continue chronic steroids.CHronic hand and shoulder pain per pt, analgesics 9. ?hyperglycemia: sugars normal---dc checks   LOS (Days) 7 A FACE TO FACE EVALUATION WAS PERFORMED  ACharlett Blake5/21/2015, 7:11 AM

## 2013-11-24 ENCOUNTER — Ambulatory Visit (HOSPITAL_COMMUNITY): Payer: Medicare Other | Admitting: *Deleted

## 2013-11-24 ENCOUNTER — Inpatient Hospital Stay (HOSPITAL_COMMUNITY): Payer: Medicare Other | Admitting: *Deleted

## 2013-11-24 ENCOUNTER — Inpatient Hospital Stay (HOSPITAL_COMMUNITY): Payer: Medicare Other | Admitting: Occupational Therapy

## 2013-11-24 ENCOUNTER — Inpatient Hospital Stay (HOSPITAL_COMMUNITY): Payer: Medicare Other | Admitting: Speech Pathology

## 2013-11-24 NOTE — Progress Notes (Signed)
Subjective/Complaints: 80 y.o. right-handed female with history of bilateral TKR, hypertension, baseline dementia, RA with chronic prednisone, DVT with chronic Coumadin and chronic renal insufficiency baseline creatinine 1.46. Per reports on 11/11/13, patient attempted to turn the light off, and was found face down on the floor but without any injuries.   Oriented to person, [facility], may but not day or year ROS appears more accurate with Yes/No questions Objective: Vital Signs: Blood pressure 120/67, pulse 75, temperature 97.9 F (36.6 C), temperature source Oral, resp. rate 18, height $RemoveBe'5\' 1"'[id number]$  (1.549 m), weight 69.1 kg (152 lb 5.4 oz), SpO2 97.00%. No results found. Results for orders placed during the hospital encounter of 11/16/13 (from the past 72 hour(s))  BASIC METABOLIC PANEL     Status: Abnormal   Collection Time    11/21/13  7:55 AM      Result Value Ref Range   Sodium 137  137 - 147 mEq/L   Potassium 4.0  3.7 - 5.3 mEq/L   Chloride 100  96 - 112 mEq/L   CO2 25  19 - 32 mEq/L   Glucose, Bld 93  70 - 99 mg/dL   BUN 9  6 - 23 mg/dL   Creatinine, Ser 1.09  0.50 - 1.10 mg/dL   Calcium 9.3  8.4 - 10.5 mg/dL   GFR calc non Af Amer 43 (*) >90 mL/min   GFR calc Af Amer 49 (*) >90 mL/min   Comment: (NOTE)     The eGFR has been calculated using the CKD EPI equation.     This calculation has not been validated in all clinical situations.     eGFR's persistently <90 mL/min signify possible Chronic Kidney     Disease.      HENT:  Head: Normocephalic and atraumatic.  Edentulous  Eyes: Pupils are equal, round, and reactive to light.  Neck: Normal range of motion. Neck supple.  Cardiovascular: Normal rate and regular rhythm.  Respiratory: Effort normal and breath sounds normal.  GI: Soft. Bowel sounds are normal. She exhibits no distension. There is no tenderness.  Musculoskeletal: She exhibits edema (pedal edema ).  RA joint deformities bilateral hands and feet. Well healed  old B-TKR  Neurological: She is alert.  Oriented to self and place. Able to follow simple one step commands. Fair awareness Mild left central 7. RUE 4- to 4/5 prox to distal with weakness with grip to RA. LUE 3+ to 4/5. LLE 3-/5 HF, KE, 3- ankles. RLE 4 /5. No sensory deficits  Skin: Skin is warm and dry.  Slowed responses, oriented to person and place  Needs verbal and physical cues for manual muscle testing with repeated instructions   Assessment/Plan: 1. Functional deficits secondary to Right caudate ICH which require 3+ hours per day of interdisciplinary therapy in a comprehensive inpatient rehab setting. Physiatrist is providing close team supervision and 24 hour management of active medical problems listed below. Physiatrist and rehab team continue to assess barriers to discharge/monitor patient progress toward functional and medical goals. FIM: FIM - Bathing Bathing Steps Patient Completed: Chest;Right Arm;Left Arm;Abdomen;Front perineal area;Right upper leg;Left upper leg Bathing: 3: Mod-Patient completes 5-7 26f 10 parts or 50-74%  FIM - Upper Body Dressing/Undressing Upper body dressing/undressing steps patient completed: Thread/unthread right sleeve of pullover shirt/dresss;Thread/unthread left sleeve of pullover shirt/dress Upper body dressing/undressing: 3: Mod-Patient completed 50-74% of tasks FIM - Lower Body Dressing/Undressing Lower body dressing/undressing steps patient completed: Thread/unthread right pants leg;Thread/unthread left pants leg;Don/Doff right sock;Don/Doff left sock Lower  body dressing/undressing: 1: Total-Patient completed less than 25% of tasks  FIM - Toileting Toileting steps completed by patient: Performs perineal hygiene Toileting Assistive Devices: Grab bar or rail for support Toileting: 3: Mod-Patient completed 2 of 3 steps  FIM - Radio producer Devices: Grab bars Toilet Transfers: 4-From toilet/BSC: Min A (steadying  Pt. > 75%);4-To toilet/BSC: Min A (steadying Pt. > 75%)  FIM - Control and instrumentation engineer Devices: Walker;Arm rests Bed/Chair Transfer: 4: Chair or W/C > Bed: Min A (steadying Pt. > 75%);4: Bed > Chair or W/C: Min A (steadying Pt. > 75%)  FIM - Locomotion: Wheelchair Distance: 25 Locomotion: Wheelchair: 1: Total Assistance/staff pushes wheelchair (Pt<25%) FIM - Locomotion: Ambulation Locomotion: Ambulation Assistive Devices: Administrator Ambulation/Gait Assistance: 4: Min assist Locomotion: Ambulation: 1: Travels less than 50 ft with minimal assistance (Pt.>75%)  Comprehension Comprehension Mode: Auditory Comprehension: 4-Understands basic 75 - 89% of the time/requires cueing 10 - 24% of the time  Expression Expression Mode: Verbal Expression: 3-Expresses basic 50 - 74% of the time/requires cueing 25 - 50% of the time. Needs to repeat parts of sentences.  Social Interaction Social Interaction: 4-Interacts appropriately 75 - 89% of the time - Needs redirection for appropriate language or to initiate interaction.  Problem Solving Problem Solving: 3-Solves basic 50 - 74% of the time/requires cueing 25 - 49% of the time  Memory Memory: 3-Recognizes or recalls 50 - 74% of the time/requires cueing 25 - 49% of the time  Medical Problem List and Plan:  1. Functional deficits secondary to Right caudate ICH  2. PE/ H/o DVT/Anticoagulation: Mechanical: Antiembolism stockings, knee (TED hose) Bilateral lower extremities  Sequential compression devices, below knee Bilateral lower extremities  3. Pain Management:   Vicodin every 6 hours prn  4. Mood: Continue Remeron at bedtime. LCSW to follow for evaluation and support.  5. Mild dementia/Neuropsych: Did not tolerate Aricept trial due to SE. This patient is not capable of making decisions on her own behalf.  6. HTN: Will monitor BP every 8 hours. Continue cardizem tid at 78m due to bradycardia--may need to reduce  further if persistent. 7. CKD: Baseline Cr-1.4 at admission. Improved to 1.08, K+ replacement 8. RA:   continue chronic steroids.CHronic hand and shoulder pain per pt, analgesics 9. ?hyperglycemia: sugars normal---dc checks   LOS (Days) 8 A FACE TO FACE EVALUATION WAS PERFORMED  ACharlett Blake5/22/2015, 7:25 AM

## 2013-11-24 NOTE — Progress Notes (Signed)
Speech Language Pathology Daily Session Note  Patient Details  Name: Shelly [name] MRN: [id number] Date of Birth: [birth year]  Today's Date: 11/24/2013 Time: 1300-1330 Time Calculation (min): 30 min  Short Term Goals: Week 1: SLP Short Term Goal 1 (Week 1): Pt will improve mastication and oral manipulation of solids with min assist-supervision cues for swallowing safety during trials of upgraded dys. 3 and/or regular solids.   SLP Short Term Goal 2 (Week 1): Pt will tolerate dys. 2 solids and thin liquids during functional meals and/or snacks with no overt s/sx of aspiration with min assist-supervision cues for swallowing safety.   SLP Short Term Goal 3 (Week 1): Pt will improve her safety awareness during functional tasks for 80% accuracy and min assist.    Skilled Therapeutic Interventions: Pt was seen for skilled speech therapy targeting family education and safety awareness/insight.  Per PT/OT, recommending discharge 1 day early secondary to progress made while inpatient.  Pt's daughter was present for the duration of the therapy session; as a result, SLP reviewed skilled education targeting dysphagia management and safety awareness and insight.  Pt identified at least 2 ways in which her physical impairments may impact her functional independence in her home environment with min-mod cuing.  Dysphagia education included s/s of aspiration, swallowing precautions (upright for meals, check for pocketing, minimize distractions) and recommended diet consistencies.  Per pt's daughter, pt preferred soft solids at baseline and would spit foods out that were too difficult to chew.  SLP provided pt's daughter with a handout of recommended diet consistencies and will plan to follow up for further training during lunch with trials of upgraded dys 3 textures early next week.     FIM:  Comprehension Comprehension Mode: Auditory Comprehension: 4-Understands basic 75 - 89% of the time/requires cueing 10 - 24%  of the time Expression Expression Mode: Verbal Expression: 4-Expresses basic 75 - 89% of the time/requires cueing 10 - 24% of the time. Needs helper to occlude trach/needs to repeat words. Social Interaction Social Interaction: 5-Interacts appropriately 90% of the time - Needs monitoring or encouragement for participation or interaction. Problem Solving Problem Solving: 3-Solves basic 50 - 74% of the time/requires cueing 25 - 49% of the time Memory Memory: 3-Recognizes or recalls 50 - 74% of the time/requires cueing 25 - 49% of the time  Pain Pain Assessment Pain Assessment: No/denies pain  Therapy/Group: Individual Therapy  Jackalyn Lombard, M.A. CCC-SLP  Melanee Spry Kaaren Nass 11/24/2013, 4:32 PM

## 2013-11-24 NOTE — Progress Notes (Signed)
Social Work Patient ID: Shelly [name], female   DOB: 26-Jul-1918, 80 y.o.   MRN: [id number] Team feels pt will be ready for discharge on Tuesday 5/26 instead of original 5/27.  MD consulted and ok with plan and daughter aware also. All in agreement with the plan now.  Family education on-going until Tuesday.

## 2013-11-24 NOTE — Progress Notes (Signed)
Occupational Therapy Weekly Progress Note  Patient Details  Name: Shelly [name] MRN: [id number] Date of Birth: 1919/03/26  Beginning of progress report period: Nov 17, 2013 End of progress report period: Nov 24, 2013  Today's Date: 11/24/2013 Time: 1000-1100 Time Calculation (min): 60 min  Patient has met 4 of 4 short term goals.  Pt has made a great deal of progress with her self care and mobility because she is no longer fearful of falling, no longer has a posterior lean, improved attention and orientation to rehab goals, improved activity tolerance.  Patient continues to demonstrate the following deficits: decreased dynamic standing balance, decreased activity tolerance, and decreased cognition and therefore will continue to benefit from skilled OT intervention to enhance overall performance with BADL.  Patient progressing toward long term goals..  Continue plan of care.  OT Short Term Goals Week 1:  OT Short Term Goal 1 (Week 1): Pt will transfer to toilet with min A using grab bars in a squat/ stand pivot. OT Short Term Goal 1 - Progress (Week 1): Met OT Short Term Goal 2 (Week 1): Pt will don a shirt with min A. OT Short Term Goal 2 - Progress (Week 1): Met OT Short Term Goal 3 (Week 1): Pt will bathe with min A. OT Short Term Goal 3 - Progress (Week 1): Met OT Short Term Goal 4 (Week 1): Pt will stand with mod A to allow her to pull her pants up. OT Short Term Goal 4 - Progress (Week 1): Met Week 2:  OT Short Term Goal 1 (Week 2): STGs = LTGs due to discharge home early next week  Skilled Therapeutic Interventions/Progress Updates:  Pt seen this session for BADL retraining of bathing at shower level, toileting, and dressing with a focus on functional mobility, balance, and attention to task. Pt was much more energetic this am and very willing to participate in a shower. She ambulated into bathroom with RW with steady A to toilet and then to shower. Pt used long sponge to wash her  feet.  Pt ambulated to room to dress. She sat in wc to dress but had difficulty reaching her feet. Pt then sat in regular arm chair and was able to don her pants.  Pt then transferred back to w/c to sit at sink to complete oral care. Pt had QRB on and call light in reach. Pt's daughter arrived at end of session, and she was updated on pt's good progress.   Continue OT 5-7 days a week 1-2x a day for 60-90 minutes with Balance/vestibular training;Cognitive remediation/compensation;Discharge planning;DME/adaptive equipment instruction;Functional mobility training;Neuromuscular re-education;Patient/family education;Therapeutic Activities;Therapeutic Exercise;UE/LE Strength taining/ROM;UE/LE Coordination activities;Visual/perceptual remediation/compensation;Self Care/advanced ADL retraining to maximize her independence in ADLs.  Therapy Documentation Precautions:  Precautions Precautions: Fall Precaution Comments: RA; generalized weakness, history of dementia Restrictions Weight Bearing Restrictions: No      Pain: Pain Assessment Pain Assessment: No/denies pain ADL: ADL ADL Comments: Refer to FIM  See FIM for current functional status  Therapy/Group: Individual Therapy  Harlene Ramus 11/24/2013, 12:21 PM

## 2013-11-24 NOTE — Progress Notes (Signed)
NUTRITION FOLLOW UP  Intervention:   1.  Supplements; continue Ensure Complete po daily, each supplement provides 350 kcal and 13 grams of protein. Continue Ensure Pudding po prn, each supplement provides 170 kcal and 4 grams of protein, may use for meds.  RD to continue to follow nutrition care plan.   NUTRITION DIAGNOSIS:  Inadequate oral intake related to advanced age as evidenced by family report.   Goal:  Intake to meet >90% of estimated nutrition needs.   Monitor:  weight trends, lab trends, I/O's, PO intake, supplement tolerance  Assessment:   PMHx significant for bilateral TKR, hypertension, rheumatoid arthritis with chronic prednisone, DVT with chronic Coumadin and chronic renal insufficiency baseline creatinine 1.46. Admitted with ICH. Transferred to CIR from acute hospital on 5/14.  Pt intake has been variable at 25-50% of her meals.  Pt reportedly eating per her usual. Ensure was discontinued due to suspicion of diarrhea.  Pt continues remeron which is a home medication for her.  Pt would benefit from re-weigh to ensure nutrition adequate to maintain weight.  She feels ready for d/c.  Height: Ht Readings from Last 1 Encounters:  11/16/13 5\' 1"  (1.549 m)    Weight Status:   Wt Readings from Last 1 Encounters:  11/16/13 152 lb 5.4 oz (69.1 kg)    Re-estimated needs:  Kcal: 1400 - 1600  Protein: 70 - 80 g  Fluid: at least 1.5 liters daily  Skin: intact  Diet Order: Dysphagia 2, thin   Intake/Output Summary (Last 24 hours) at 11/24/13 1422 Last data filed at 11/24/13 0900  Gross per 24 hour  Intake    600 ml  Output      0 ml  Net    600 ml    Last BM: 5/21  Labs:   Recent Labs Lab 11/21/13 0755  NA 137  K 4.0  CL 100  CO2 25  BUN 9  CREATININE 1.09  CALCIUM 9.3  GLUCOSE 93    CBG (last 3)  No results found for this basename: GLUCAP,  in the last 72 hours  Scheduled Meds: . diltiazem  30 mg Oral TID  . feeding supplement (ENSURE  COMPLETE)  237 mL Oral Q24H  . methylphenidate  5 mg Oral BID WC  . mirtazapine  15 mg Oral QHS  . pantoprazole  40 mg Oral QPC breakfast  . potassium chloride  10 mEq Oral Daily  . predniSONE  5 mg Oral Q breakfast  . senna-docusate  1 tablet Oral BID    Continuous Infusions:   11/23/13, MS RD LDN Clinical Inpatient Dietitian Pager: [phone] Weekend/After hours pager: [phone]

## 2013-11-24 NOTE — Progress Notes (Signed)
Physical Therapy Weekly Progress Note  Patient Details  Name: Shelly [name] MRN: [id number] Date of Birth: 01/14/1919  Beginning of progress report period: Nov 17, 2013 End of progress report period: Nov 24, 2013  Today's Date: 11/24/2013 Time: 11:15-12:02 (48mn) and 14:05-15:00 (569m)   Patient has met 0 of 7 long term goals.  Short term goals not set due to estimated length of stay.  Pt is making great progress towards LTG with improvements in attention, strength, activity tolerance, and decreased pain. Pt is on track to meet LTG by DC date. Family training begun today.   Patient continues to demonstrate the following deficits: weakness, cognition, and L-sided attention and therefore will continue to benefit from skilled PT intervention to enhance overall performance with activity tolerance, balance and attention.  See Patient's Care Plan for progression toward long term goals.  Patient progressing toward long term goals..Continue plan of care.  Skilled Therapeutic Interventions/Progress Updates:    First tx focused on family training for transfers, gait, and stairs.  Demonstrated and educated daughter on basic WC<>bed and WC<>toilet transfers with RW and grab bars respectively. Daughter able to return-demo safe technique with min cues for safe positioning. Pt needed min A only for safety and had no LOB.   Instructed pt/daughter in stairs without RW, using R rail and L HHA, ascending forward, descending backwards. This was determined safest method after attempting various other techniques. Pt performed 2 steps x3 reps with up to Mod A for lifting or lowering. Daughter able to return demo with cues for positioning. Discussed option of ramp, but daughter says ramp is more difficult.   Performed gait with RW x125' with min-guard A and increased time with cues for posture.   Pt left with daughter in room. Discussed pt's progress and possibility of early DC if team agrees.    ---- Second tx  focused on stride [facility] walking [facility] for gait, therex and functional mobility. Gait training with RW 2x80' with close S and cues for posture. Decreased speed noted.  Performed stair training with R rail and L HHA for 4 steps with cues for sequence and technique, descending backwards.   Instructed pt in ramp and curb technique with RW and min A overall for steadying with cues for RW management.   Pt performed seated ball kicking and LE therex for functional strengthening with cues for technique 2x20 for LAQ and marching. Nustep x5m20mwith bil LEs only at level 3 with encouragement to continue.    All transfers with min-guard/S assist.     Therapy Documentation Precautions:  Precautions Precautions: Fall Precaution Comments: RA; generalized weakness, history of dementia Restrictions Weight Bearing Restrictions: No   Pain: Pain Assessment Pain Assessment: No/denies pain Mobility:   Locomotion : Ambulation Ambulation/Gait Assistance: 4: Min assist   See FIM for current functional status  Therapy/Group: Individual Therapy and group tx ColKennieth RadT, DPT   11/24/2013, 12:20 PM

## 2013-11-25 ENCOUNTER — Inpatient Hospital Stay (HOSPITAL_COMMUNITY): Payer: Medicare Other | Admitting: Speech Pathology

## 2013-11-25 DIAGNOSIS — I619 Nontraumatic intracerebral hemorrhage, unspecified: Secondary | ICD-10-CM

## 2013-11-25 LAB — CBC
HCT: 27.6 % — ABNORMAL LOW (ref 36.0–46.0)
Hemoglobin: 9 g/dL — ABNORMAL LOW (ref 12.0–15.0)
MCH: 22.8 pg — ABNORMAL LOW (ref 26.0–34.0)
MCHC: 32.6 g/dL (ref 30.0–36.0)
MCV: 69.9 fL — ABNORMAL LOW (ref 78.0–100.0)
Platelets: 303 10*3/uL (ref 150–400)
RBC: 3.95 MIL/uL (ref 3.87–5.11)
RDW: 16.1 % — ABNORMAL HIGH (ref 11.5–15.5)
WBC: 7 10*3/uL (ref 4.0–10.5)

## 2013-11-25 LAB — BASIC METABOLIC PANEL
BUN: 11 mg/dL (ref 6–23)
CO2: 27 mEq/L (ref 19–32)
Calcium: 9.4 mg/dL (ref 8.4–10.5)
Chloride: 96 mEq/L (ref 96–112)
Creatinine, Ser: 1.1 mg/dL (ref 0.50–1.10)
GFR calc Af Amer: 49 mL/min — ABNORMAL LOW (ref >90)
GFR calc non Af Amer: 42 mL/min — ABNORMAL LOW (ref >90)
Glucose, Bld: 82 mg/dL (ref 70–99)
Potassium: 4.2 mEq/L (ref 3.7–5.3)
Sodium: 133 mEq/L — ABNORMAL LOW (ref 137–147)

## 2013-11-25 NOTE — Progress Notes (Signed)
Speech Language Pathology Daily Session Note  Patient Details  Name: Shelly [name] MRN: [id number] Date of Birth: 11/18/1918  Today's Date: 11/25/2013 Time: 1130-1230 Time Calculation (min): 60 min  Short Term Goals: Week 1: SLP Short Term Goal 1 (Week 1): Pt will improve mastication and oral manipulation of solids with min assist-supervision cues for swallowing safety during trials of upgraded dys. 3 and/or regular solids.   SLP Short Term Goal 2 (Week 1): Pt will tolerate dys. 2 solids and thin liquids during functional meals and/or snacks with no overt s/sx of aspiration with min assist-supervision cues for swallowing safety.   SLP Short Term Goal 3 (Week 1): Pt will improve her safety awareness during functional tasks for 80% accuracy and min assist.    Skilled Therapeutic Interventions: Skilled ST intervention provided with focus on dysphagia goals. Pt seated upright in w/c, 90 degrees. Pt seen for diner[employer] club with lunch meal. Pt consumed 50% of D2/thin consistency meal. Pt required min verbal cues to utilize compensatory swallow strategies. Pt noted with occasional wet vocal quality and required verbal cue to clear throat. No coughing/throat clear noted with thin by cup.    FIM:  Comprehension Comprehension Mode: Auditory Comprehension: 4-Understands basic 75 - 89% of the time/requires cueing 10 - 24% of the time Expression Expression Mode: Verbal Expression: 4-Expresses basic 75 - 89% of the time/requires cueing 10 - 24% of the time. Needs helper to occlude trach/needs to repeat words. Social Interaction Social Interaction: 5-Interacts appropriately 90% of the time - Needs monitoring or encouragement for participation or interaction. Problem Solving Problem Solving: 3-Solves basic 50 - 74% of the time/requires cueing 25 - 49% of the time Memory Memory: 3-Recognizes or recalls 50 - 74% of the time/requires cueing 25 - 49% of the time FIM - Eating Eating Activity: 5: Set-up assist  for open containers;5: Supervision/cues  Pain Pain Assessment Pain Assessment: No/denies pain  Therapy/Group: Individual Therapy  Kara Pacer Ommie Degeorge 11/25/2013, 3:57 PM

## 2013-11-25 NOTE — Progress Notes (Signed)
  Subjective/Complaints: 80 y.o. right-handed female with history of bilateral TKR, hypertension, baseline dementia, RA with chronic prednisone, DVT with chronic Coumadin and chronic renal insufficiency baseline creatinine 1.46. Per reports on 11/11/13, patient attempted to turn the light off, and was found face down on the floor but without any injuries.  She has no specific complaints today. She feels well. She denies pain. Objective: Vital Signs: Blood pressure 135/64, pulse 84, temperature 97.4 F (36.3 C), temperature source Oral, resp. rate 18, height 5\' 1"  (1.549 m), weight 152 lb 5.4 oz (69.1 kg), SpO2 96.00%.   Elderly female in no acute distress. She appears younger than her stated age. Chest clear to auscultation. Cardiac exam S1-S2 are irregular. Extremities: No edema.  Assessment/Plan: 1. Functional deficits secondary to Right caudate ICH Medical Problem List and Plan:  1. Functional deficits secondary to Right caudate ICH  2. PE/ H/o DVT/Anticoagulation: Mechanical: Antiembolism stockings, knee (TED hose) Bilateral lower extremities  Sequential compression devices, below knee Bilateral lower extremities  3. Pain Management:   Vicodin every 6 hours prn  4. Mood: Continue Remeron at bedtime. LCSW to follow for evaluation and support.  5. Mild dementia/Neuropsych: Did not tolerate Aricept trial due to SE. This patient is not capable of making decisions on her own behalf.  6. HTN: Blood pressure range 127 151/68-75. 7. CKD: Baseline Cr-1.4 at admission. Improved to 1.08, K+ replacement 8. RA:   continue chronic steroids.CHronic hand and shoulder pain per pt, analgesics 9. ?hyperglycemia: Last serum blood sugars were 82, 93, 84 over the past 10 days.  10anemia. Hemoglobin stable at 9.0. Grams per deciliter.    LOS (Days) 9 A FACE TO FACE EVALUATION WAS PERFORMED  Bruce H Swords 11/25/2013, 9:30 AM

## 2013-11-26 ENCOUNTER — Inpatient Hospital Stay (HOSPITAL_COMMUNITY): Payer: Medicare Other | Admitting: Physical Therapy

## 2013-11-26 NOTE — Progress Notes (Signed)
Physical Therapy Session Note  Patient Details  Name: Shelly [name] MRN: [id number] Date of Birth: 1919/02/23  Today's Date: 11/26/2013 Time: 1520-1550 Time Calculation (min): 30 min  Short Term Goals: Week 2:  PT Short Term Goal 1 (Week 2): STG=LTG due to LOS, Supervision overall for transfers and gait, Min A 2 stairs 1 rail  Skilled Therapeutic Interventions/Progress Updates:   Daughter present and performing all mobility with patient for continued family training. Pt received sitting in recliner, agreeable to therapy. Stand pivot transfer recliner > w/c with min guard, total A for w/c mobility on unit. Gait training using RW in controlled environment x 100 ft and S-min guard and home environment 2 x 30 ft with 1 seated rest in day room and S-min guard. Patient performed car transfer with supervision/setup and vc's for sequencing. Stair negotiation in stairwell: up/down 3 stairs x 2 using R rail and L HHA with overall min A, ascending forwards and descending backwards. Patient and daughter excited about possible early d/c home and with no further questions/concerns. Patient left semi reclined in bed with daughter present and all needs within reach.   Therapy Documentation Precautions:  Precautions Precautions: Fall Precaution Comments: RA; generalized weakness, history of dementia Restrictions Weight Bearing Restrictions: No Pain:  Denies pain  See FIM for current functional status  Therapy/Group: Individual Therapy  Kerney Elbe 11/26/2013, 3:59 PM

## 2013-11-26 NOTE — Progress Notes (Signed)
  Subjective/Complaints: Patient's only complaints are related to bilateral knee pain. This is not new for her. She has no other specific complaints. She is resting in bed. Objective: Vital Signs: Blood pressure 126/58, pulse 76, temperature 97.9 F (36.6 C), temperature source Oral, resp. rate 15, height 5\' 1"  (1.549 m), weight 152 lb 5.4 oz (69.1 kg), SpO2 100.00%.   Elderly female in no acute distress. She appears younger than her stated age. Chest clear to auscultation. Cardiac exam S1-S2 are irregular. Extremities: No edema.  Assessment/Plan: 1. Functional deficits secondary to Right caudate ICH Medical Problem List and Plan:  1. Functional deficits secondary to Right caudate ICH  2. PE/ H/o DVT/Anticoagulation: Mechanical: Antiembolism stockings, knee (TED hose) Bilateral lower extremities  Sequential compression devices, below knee Bilateral lower extremities  3. Pain Management:   4. Mood: Continue Remeron at bedtime. LCSW to follow for evaluation and support.  5. Mild dementia/Neuropsych: Did not tolerate Aricept trial due to SE. This patient is not capable of making decisions on her own behalf.  6. HTN: Blood pressure range 127 151/68-75. 7. CKD: Baseline Cr-1.4 at admission. Improved to 1.08, K+ replacement 8. RA:   On prednisone 5 mg daily. 9. ?hyperglycemia: This seems to have resolved.  10anemia. Hemoglobin stable at 9.0. Grams per deciliter.    LOS (Days) 10 A FACE TO FACE EVALUATION WAS PERFORMED  Bruce H Swords 11/26/2013, 9:10 AM

## 2013-11-27 ENCOUNTER — Inpatient Hospital Stay (HOSPITAL_COMMUNITY): Payer: Medicare Other | Admitting: Occupational Therapy

## 2013-11-27 ENCOUNTER — Inpatient Hospital Stay (HOSPITAL_COMMUNITY): Payer: Medicare Other

## 2013-11-27 ENCOUNTER — Inpatient Hospital Stay (HOSPITAL_COMMUNITY): Payer: Medicare Other | Admitting: *Deleted

## 2013-11-27 ENCOUNTER — Inpatient Hospital Stay (HOSPITAL_COMMUNITY): Payer: Medicare Other | Admitting: Speech Pathology

## 2013-11-27 NOTE — Progress Notes (Signed)
Speech Language Pathology Discharge Summary  Patient Details  Name: Shelly [name] MRN: [id number] Date of Birth: 1918-09-19  Today's Date: 11/27/2013 Time: 1130-1215 Time Calculation (min): 45 min  Skilled Therapeutic Interventions:   Pt was seen for skilled speech therapy targeting dysphagia management.  Pt was observed with presentations of upgraded dys 3 textures during a functional meal with no overt s/s of aspiration noted.  Pt exhibited prolonged mastication of solids; however, she achieved adequate oral clearance of solids which indicates an improvement from initial eval.  Oral phase impairments are most likely returned to baseline per daughter's report and pt was noted with good spontaneous use of compensatory swallowing strategies (slow rate, small bites/sips, spitting out textures that are too difficult to chew).  Therefore, recommend a diet upgrade to dys 3 solids with continued thin liquids.     Patient has met 4 of 4 long term goals.  Patient to discharge at overall Supervision;Min level.  Reasons goals not met:     Clinical Impression/Discharge Summary: Patient has made functional gains and has met 4 of 4 long term goals this admission due to improved sustained attention, mastication of solids, and use of compensatory strategies for swallowing safety. Patient is currently an overall min assist level for cognitive tasks and requires min assist-supervision cues for utilization of swallowing compensatory strategies to minimize overt s/s of aspiration with dys 3 textures and thin liquids. Patient and family education have been completed at this time and patient will discharge home with 24/7 supervision from family.  Patient would benefit from intermittent supervision for swallowing safety during meals at discharge and continued assistance for meds and finances due to baseline cognitive impairments.  No further speech needs are indicated at this time.     Care Partner:  Caregiver Able to  Provide Assistance: Yes  Type of Caregiver Assistance: Physical;Cognitive  Recommendation:  24 hour supervision/assistance      Equipment: n/a   Reasons for discharge: Discharged from hospital   Patient/Family Agrees with Progress Made and Goals Achieved: Yes   See FIM for current functional status  Windell Moulding, M.A. CCC-SLP   Selinda Orion Rhea Kaelin 11/27/2013, 12:55 PM

## 2013-11-27 NOTE — Progress Notes (Signed)
Social Work Patient ID: Shelly [name], female   DOB: 08/05/1918, 80 y.o.   MRN: [id number] Pt requires a transport chair for her care-she is unable to self propel herself and needs for self care and mobility.  She is 80 years old with multiple health issues.  For example RA, Chronic Coumadin-DVT and ICH.

## 2013-11-27 NOTE — Progress Notes (Signed)
Occupational Therapy Discharge Summary  Patient Details  Name: Shelly [name] MRN: [id number] Date of Birth: 02/12/1919  Today's Date: 11/27/2013  Patient has met 6 of 9 long term goals due to improved activity tolerance, improved balance, postural control, ability to compensate for deficits, improved attention, improved awareness and improved coordination.  Patient to discharge at [facility] Assist level.  Patient's care partner is independent to provide the necessary physical and cognitive assistance at discharge.    Reasons goals not met: Pt had a goal of supervision with UB dressing, but needs min A with her bra.  She had a goal of min A with LB dressing, but needs mod A as she has difficulty pulling up her briefs and needs assist with socks and shoes.  Pt also had goal of min A with toileting but needs mod A as she continues to need A with pulling up her briefs. This extra amount of assist is minimal, and her daughter is comfortable with providing her care.  Recommendation:  Patient will benefit from ongoing skilled OT services in home health setting to continue to advance functional skills in the area of BADL.  Equipment: transfer tub bench  Reasons for discharge: treatment goals met  Patient/family agrees with progress made and goals achieved: Yes  OT Discharge ADL ADL Eating: Set up;Supervision/safety Grooming: Setup Upper Body Bathing: Supervision/safety Where Assessed-Upper Body Bathing: Shower Lower Body Bathing: Supervision/safety Where Assessed-Lower Body Bathing: Shower Upper Body Dressing: Minimal assistance Where Assessed-Upper Body Dressing: Chair Lower Body Dressing: Moderate assistance Where Assessed-Lower Body Dressing: Chair Toileting: Moderate assistance Where Assessed-Toileting: Glass blower/designer: Psychiatric nurse Method: Ambulating Tub/Shower Transfer: Minimal Museum/gallery conservator Method: Scientific laboratory technician ADL Comments: Refer to FIM Vision/Perception  Vision- History Baseline Vision/History: Cataracts Patient Visual Report: No change from baseline Vision- Assessment Eye Alignment: Within Functional Limits  Cognition Overall Cognitive Status: Impaired/Different from baseline Orientation Level: Oriented to person;Disoriented to time;Oriented to place;Oriented to situation Memory Impairment: Decreased recall of new information;Decreased short term memory Sensation Sensation Light Touch: Appears Intact Stereognosis: Appears Intact Hot/Cold: Appears Intact Proprioception: Appears Intact Coordination Gross Motor Movements are Fluid and Coordinated: No Fine Motor Movements are Fluid and Coordinated: No Coordination and Movement Description: Pt with very slow speed of movements, and painful movement postures related to RA Motor  Motor Motor - Discharge Observations: Improved postural control, but still with occasional posterior lean upon initial standing ;. Mobility    min A with ADL transfers Trunk/Postural Assessment  Cervical Assessment Cervical Assessment: Within Functional Limits Thoracic Assessment Thoracic Assessment: Within Functional Limits Lumbar Assessment Lumbar Assessment: Exceptions to [facility] (posterior pelvic tilt) Postural Control Trunk Control: Mild posterior leaning upon initial standing, but pt able to compensate with hip strategy Righting Reactions: Somewhat delayed but sufficient for upright posture during functional tasks  Balance Static Sitting Balance Static Sitting - Level of Assistance: 5: Stand by assistance Dynamic Sitting Balance Dynamic Sitting - Level of Assistance: 5: Stand by assistance Static Standing Balance Static Standing - Level of Assistance: 5: Stand by assistance Dynamic Standing Balance Dynamic Standing - Level of Assistance: 5: Stand by assistance Extremity/Trunk Assessment RUE Assessment RUE Assessment: Exceptions to  [facility] (3+/5 arthritic changes) LUE Assessment LUE Assessment: Exceptions to [facility] (3+/5 arthritic changes)  See FIM for current functional status  Harlene Ramus 11/27/2013, 12:41 PM

## 2013-11-27 NOTE — Progress Notes (Signed)
Occupational Therapy Session Note  Patient Details  Name: Shelly [name] MRN: [id number] Date of Birth: 07/13/1918  Today's Date: 11/27/2013 Time: 0905-1000  And [id number]  Time Calculation (min): 55 min and 30 min  Short Term Goals: Week 1:  OT Short Term Goal 1 (Week 1): Pt will transfer to toilet with min A using grab bars in a squat/ stand pivot. OT Short Term Goal 1 - Progress (Week 1): Met OT Short Term Goal 2 (Week 1): Pt will don a shirt with min A. OT Short Term Goal 2 - Progress (Week 1): Met OT Short Term Goal 3 (Week 1): Pt will bathe with min A. OT Short Term Goal 3 - Progress (Week 1): Met OT Short Term Goal 4 (Week 1): Pt will stand with mod A to allow her to pull her pants up. OT Short Term Goal 4 - Progress (Week 1): Met Week 2:  OT Short Term Goal 1 (Week 2): STGs = LTGs due to discharge home early next week      Skilled Therapeutic Interventions/Progress Updates:    Visit 1:  Pain: c/o arthritic pain in B wrists. Discussed use of soft wrist splints with pt's daughter.   Tx:   Pt seen for BADL retraining of toileting, bathing, and dressing with a focus on balance, activity tolerance, attention and family education with patient's daughter.  Pt worked on ambulating around the room with her RW. She was more distracted today and needed more VC and tactile cues to follow directions. Pt was able to complete transfers with steadying A and min A for bathing and UB dressing. She needs mod A with LB dressing to don shoes and to pull up her briefs. Pt moved slowly today but was able to fully participate. Pt resting in w/c with QRB on.  Visit 2:  Pain:  Continues to c/o pain in B wrists on when trying to grip an object.    Tx: Pt seen this session for continued family education with training for tub bench transfers in ADL apartment.  Pt ambulated to bathroom with RW and practiced tub bench transfers 2x with min A. Pt's daughter reported that she is comfortable with providing the min A  care and is ready to take her mother home tomorrow. Pt then returned to room and ambulated to recliner. Pt had QRB applied to recliner with call light in reach.  Therapy Documentation Precautions:  Precautions Precautions: Fall Precaution Comments: RA; generalized weakness, history of dementia Restrictions Weight Bearing Restrictions: No       ADL: ADL ADL Comments: Refer to FIM       See FIM for current functional status  Therapy/Group: Individual Therapy  Harlene Ramus 11/27/2013, 11:56 AM

## 2013-11-27 NOTE — Progress Notes (Signed)
Physical Therapy Discharge Summary  Patient Details  Name: Shelly [name] MRN: [id number] Date of Birth: 1918/08/21  Today's Date: 11/27/2013 Time: 0800-0858 Time Calculation (min): 58 min  Patient has met 7 of 7 long term goals due to improved activity tolerance, improved balance, improved postural control, decreased pain, improved attention and improved coordination.  Patient to discharge at an ambulatory level Supervision.   Patient's care partner is independent to provide the necessary physical and cognitive assistance at discharge.  Skilled Pt tx:  Pt able to meet all PT goals, but will continue to have difficult days due to age-related changes and RA pain limiting function. Daughter is highly capable to assisting pt and has been trained for safety.  Tx session focused on functional mobility in apartment-simulated setting, gait with RW, and stairs. See FIM. Pt very fatigued this morning as she was deeply asleep when I arrived, needing encouragement to participate first thing. Pt needed increased time to complete each aspect of mobility and rest breaks between tasks.  Tight-space apartment simulation x50' with RW close S and safety cues. Performed all transfers with close S/min A for complicated situations, including toilet and bathroom navigation on uneven terrain. Gait in controlled setting x100' with close S and postural cues.   Recommendation:  Patient will benefit from ongoing skilled PT services in home health setting to continue to advance safe functional mobility, address ongoing impairments in strength, ROM, static and dynamic balance, and minimize fall risk.  Equipment: Transport WC as daughter is primary care provider for pt as well as elderly father, and will need to provide community mobility. Pt unable to use manual WC due to RA.   Reasons for discharge: treatment goals met and discharge from hospital  Patient/family agrees with progress made and goals achieved: Yes  PT  Discharge Precautions/Restrictions Precautions Precautions: Fall Precaution Comments: RA; generalized weakness, history of dementia Restrictions Weight Bearing Restrictions: No  Pain - 8/10 bil knee pain, nursing made aware    Vision/Perception  Vision - History Baseline Vision: Wears glasses only for reading Vision - Assessment Eye Alignment: Within Functional Limits Vision Assessment: Vision tested Tracking/Visual Pursuits: Able to track stimulus in all quads without difficulty Additional Comments: Pt with improved ability to follow instructions and track moving target, no longer needing cues for gaze during functional activities.  Perception Perception: Within Functional Limits Inattention/Neglect: Appears intact Praxis Praxis: Intact Praxis-Other Comments: Pt continues to need min initiation cues, but likely more related to dementia and motivation  Cognition Overall Cognitive Status: Impaired/Different from baseline Arousal/Alertness: Lethargic Orientation Level: Oriented to person;Disoriented to place;Disoriented to time;Disoriented to situation (dementia at baseline) Comments: Pt close to baseline cognition with improved attention since eval, but still somewhat slow processing.  Sensation Sensation Light Touch: Appears Intact Stereognosis: Not tested Hot/Cold: Appears Intact Proprioception: Appears Intact Coordination Gross Motor Movements are Fluid and Coordinated: No Fine Motor Movements are Fluid and Coordinated: No Coordination and Movement Description: Pt with very slow speed of movements, and painful movement postures related to RA Heel Shin Test: Decreased accuracy and excursion, but able to follow instructions Motor  Motor Motor: Other (comment) (Generalized weakness and forward flexed posture) Motor - Discharge Observations: Improved postural control, but still with occasional posterior lean upon initial standing ;.  Mobility Bed Mobility Bed Mobility:  Supine to Sit Supine to Sit: 6: Modified independent (Device/Increase time) Transfers Transfers: Yes Stand Pivot Transfers: 5: Supervision Stand Pivot Transfer Details (indicate cue type and reason): S cues for safety with hand  placement and completing turn Locomotion  Ambulation Ambulation: Yes Ambulation/Gait Assistance: 5: Supervision Ambulation Distance (Feet): 100 Feet Assistive device: Rolling walker Ambulation/Gait Assistance Details: Pt needing encouragement to continue due to fatigue this morning, just having woken up. Pt still with mildly forward flexed posture and shuffling steps, with decreased gait speed.  Stairs / Additional Locomotion Stairs: Yes Stairs Assistance: 4: Min assist Stairs Assistance Details (indicate cue type and reason): Steadying assist and sequencing cues Stair Management Technique: Two rails;Step to pattern Number of Stairs: 4 Height of Stairs: 6 Wheelchair Mobility Wheelchair Mobility: Yes Wheelchair Assistance: 4: Energy manager: Both upper extremities Wheelchair Parts Management: Needs assistance Distance: 25  Trunk/Postural Assessment  Cervical Assessment Cervical Assessment: Within Functional Limits Thoracic Assessment Thoracic Assessment: Within Functional Limits Lumbar Assessment Lumbar Assessment: Exceptions to [facility] (Posterior pelvic tilt) Postural Control Postural Control: Deficits on evaluation Trunk Control: Mild posterior leaning upon initial standing, but pt able to compensate with hip strategy Righting Reactions: Somewhat delayed but sufficient for upright posture during functional tasks  Balance Balance Balance Assessed: Yes Static Sitting Balance Static Sitting - Level of Assistance: 5: Stand by assistance Dynamic Sitting Balance Dynamic Sitting - Balance Support: Right upper extremity supported;Left upper extremity supported;Feet supported Dynamic Sitting - Level of Assistance: 5: Stand by  assistance Static Standing Balance Static Standing - Balance Support: Right upper extremity supported;Left upper extremity supported Static Standing - Level of Assistance: 5: Stand by assistance Static Standing - Comment/# of Minutes: Pt able to stand with 1 UE support for 29mn with S, up to 10 seconds without UE support Dynamic Standing Balance Dynamic Standing - Balance Support: Left upper extremity supported;Right upper extremity supported;During functional activity Dynamic Standing - Level of Assistance: 5: Stand by assistance Dynamic Standing - Balance Activities: Lateral lean/weight shifting;Forward lean/weight shifting;Reaching across midline;Reaching for objects   RLE Assessment RLE Assessment: Ex[facility] to [facility]Gross[facility] 3+/5, limited assessment due to knee pain today) LLE Assessment LLE Assessment: Exceptions to [facility](Grossly 3+/5, limited assessment due to knee pain today)  See FIM for current functional status CKennieth Rad PT, DPT   11/27/2013, 11:41 AM

## 2013-11-27 NOTE — Progress Notes (Signed)
Social Work Patient ID: Shelly [name], female   DOB: [birth year], 80 y.o.   MRN: [id number] Daughter is here to go through family training for mom.  Has a walker that will work but does need a transport chair and tub bench. Pt unable to self propel due to her RA and other health issues.  Daughter is both of her parents primary caregiver.  Both feel ready for discharge tomorrow.

## 2013-11-27 NOTE — Progress Notes (Signed)
Subjective/Complaints: 80 y.o. right-handed female with history of bilateral TKR, hypertension, baseline dementia, RA with chronic prednisone, DVT with chronic Coumadin and chronic renal insufficiency baseline creatinine 1.46. Per reports on 11/11/13, patient attempted to turn the light off, and was found face down on the floor but without any injuries.   Oriented to person, "[facility]" but not monday ROS appears more accurate with Yes/No questions Objective: Vital Signs: Blood pressure 138/71, pulse 86, temperature 98.3 F (36.8 C), temperature source Axillary, resp. rate 17, height _0  (1.549 m), weight 69.1 kg (152 lb 5.4 oz), SpO2 100.00%. Ct Head Wo Contrast  11/27/2013   CLINICAL DATA:  Follow-up intracranial hemorrhage.  EXAM: CT HEAD WITHOUT CONTRAST  TECHNIQUE: Contiguous axial images were obtained from the base of the skull through the vertex without intravenous contrast.  COMPARISON:  CT HEAD W/O CM dated 11/14/2013; CT HEAD W/O CM dated 11/13/2013  FINDINGS: 13 mm hypodensity in right inferior basal ganglia at site of prior hemorrhage. Bilateral basal ganglia mineralization.  No intraparenchymal hemorrhage, mass effect or midline shift. Patchy to confluent supratentorial white matter hypodensities again seen without acute large vascular territory infarct.  Basal cisterns are patent. Moderate calcific atherosclerosis the carotid siphons. Trace of sphenoid mucosal thickening without paranasal sinus air-fluid levels. The mastoid air cells are well aerated.  No skull fracture. Ocular globes and orbital contents are nonsuspicious, status post bilateral ocular lens implants.  IMPRESSION: Right inferior basal ganglia infarct at the area of prior hemorrhage with no new bleed nor acute intracranial process.  Involutional changes. Moderate white matter changes suggest chronic small vessel ischemic disease.   Electronically Signed   By: Elon Alas   On: 11/27/2013 06:35   Results for orders  placed during the hospital encounter of 11/16/13 (from the past 72 hour(s))  BASIC METABOLIC PANEL     Status: Abnormal   Collection Time    11/25/13  4:45 AM      Result Value Ref Range   Sodium 133 (*) 137 - 147 mEq/L   Potassium 4.2  3.7 - 5.3 mEq/L   Chloride 96  96 - 112 mEq/L   CO2 27  19 - 32 mEq/L   Glucose, Bld 82  70 - 99 mg/dL   BUN 11  6 - 23 mg/dL   Creatinine, Ser 1.10  0.50 - 1.10 mg/dL   Calcium 9.4  8.4 - 10.5 mg/dL   GFR calc non Af Amer 42 (*) >90 mL/min   GFR calc Af Amer 49 (*) >90 mL/min   Comment: (NOTE)     The eGFR has been calculated using the CKD EPI equation.     This calculation has not been validated in all clinical situations.     eGFR's persistently <90 mL/min signify possible Chronic Kidney     Disease.  CBC     Status: Abnormal   Collection Time    11/25/13  4:45 AM      Result Value Ref Range   WBC 7.0  4.0 - 10.5 K/uL   RBC 3.95  3.87 - 5.11 MIL/uL   Hemoglobin 9.0 (*) 12.0 - 15.0 g/dL   HCT 27.6 (*) 36.0 - 46.0 %   MCV 69.9 (*) 78.0 - 100.0 fL   MCH 22.8 (*) 26.0 - 34.0 pg   MCHC 32.6  30.0 - 36.0 g/dL   RDW 16.1 (*) 11.5 - 15.5 %   Platelets 303  150 - 400 K/uL      HENT:  Head: Normocephalic and atraumatic.  Edentulous  Eyes: Pupils are equal, round, and reactive to light.  Neck: Normal range of motion. Neck supple.  Cardiovascular: Normal rate and regular rhythm.  Respiratory: Effort normal and breath sounds normal.  GI: Soft. Bowel sounds are normal. She exhibits no distension. There is no tenderness.  Musculoskeletal: She exhibits edema (pedal edema ).  RA joint deformities bilateral hands and feet. Well healed old B-TKR  Neurological: She is alert.  Oriented to self and place. Able to follow simple one step commands. Fair awareness Mild left central 7. RUE 4- to 4/5 prox to distal with weakness with grip to RA. LUE 3+ to 4/5. LLE 3-/5 HF, KE, 3- ankles. RLE 4 /5. No sensory deficits  Skin: Skin is warm and dry.  Slowed  responses, no signs of agitation    Assessment/Plan: 1. Functional deficits secondary to Right caudate ICH which require 3+ hours per day of interdisciplinary therapy in a comprehensive inpatient rehab setting. Physiatrist is providing close team supervision and 24 hour management of active medical problems listed below. Physiatrist and rehab team continue to assess barriers to discharge/monitor patient progress toward functional and medical goals. Plan D/C for am FIM: FIM - Bathing Bathing Steps Patient Completed: Chest;Right Arm;Left Arm;Abdomen;Front perineal area;Right upper leg;Left upper leg;Buttocks;Right lower leg (including foot);Left lower leg (including foot) (used long sponge) Bathing: 4: Steadying assist  FIM - Upper Body Dressing/Undressing Upper body dressing/undressing steps patient completed: Thread/unthread right sleeve of pullover shirt/dresss;Thread/unthread left sleeve of pullover shirt/dress;Thread/unthread right bra strap;Thread/unthread left bra strap;Put head through opening of pull over shirt/dress;Pull shirt over trunk Upper body dressing/undressing: 4: Min-Patient completed 75 plus % of tasks FIM - Lower Body Dressing/Undressing Lower body dressing/undressing steps patient completed: Thread/unthread right underwear leg;Thread/unthread left underwear leg;Thread/unthread right pants leg;Thread/unthread left pants leg;Pull pants up/down Lower body dressing/undressing: 3: Mod-Patient completed 50-74% of tasks  FIM - Toileting Toileting steps completed by patient: Performs perineal hygiene Toileting Assistive Devices: Grab bar or rail for support Toileting: 2: Max-Patient completed 1 of 3 steps  FIM - Radio producer Devices: Grab bars Toilet Transfers: 4-To toilet/BSC: Min A (steadying Pt. > 75%);4-From toilet/BSC: Min A (steadying Pt. > 75%)  FIM - Control and instrumentation engineer Devices: Walker;Arm rests;Bed  rails Bed/Chair Transfer: 5: Sit > Supine: Supervision (verbal cues/safety issues);4: Bed > Chair or W/C: Min A (steadying Pt. > 75%);4: Chair or W/C > Bed: Min A (steadying Pt. > 75%)  FIM - Locomotion: Wheelchair Distance: 25 Locomotion: Wheelchair: 1: Total Assistance/staff pushes wheelchair (Pt<25%) FIM - Locomotion: Ambulation Locomotion: Ambulation Assistive Devices: Administrator Ambulation/Gait Assistance: 4: Min guard Locomotion: Ambulation: 2: Travels 50 - 149 ft with minimal assistance (Pt.>75%)  Comprehension Comprehension Mode: Auditory Comprehension: 4-Understands basic 75 - 89% of the time/requires cueing 10 - 24% of the time  Expression Expression Mode: Verbal Expression: 4-Expresses basic 75 - 89% of the time/requires cueing 10 - 24% of the time. Needs helper to occlude trach/needs to repeat words.  Social Interaction Social Interaction: 5-Interacts appropriately 90% of the time - Needs monitoring or encouragement for participation or interaction.  Problem Solving Problem Solving: 3-Solves basic 50 - 74% of the time/requires cueing 25 - 49% of the time  Memory Memory: 3-Recognizes or recalls 50 - 74% of the time/requires cueing 25 - 49% of the time  Medical Problem List and Plan:  1. Functional deficits secondary to Right caudate ICH  2. PE/ H/o DVT/Anticoagulation: Mechanical: Antiembolism stockings,  knee (TED hose) Bilateral lower extremities  Sequential compression devices, below knee Bilateral lower extremities  3. Pain Management:   Vicodin every 6 hours prn  4. Mood: Continue Remeron at bedtime. LCSW to follow for evaluation and support.  5. Mild dementia/Neuropsych: Did not tolerate Aricept trial due to SE. This patient is not capable of making decisions on her own behalf.  6. HTN: Will monitor BP every 8 hours. Continue cardizem tid at 27m due to bradycardia--may need to reduce further if persistent. 7. CKD: Baseline Cr-1.4 at admission. Improved to  1.08, K+ replacement 8. RA:   continue chronic steroids.CHronic hand and shoulder pain per pt, analgesics 9. ?hyperglycemia: sugars normal---dc checks 10.  Anemia- stable may be related to chronic disease but lower than at admit to hospital , follow up with PCP as outpt  LOS (Days) 11 A FACE TO FACE EVALUATION WAS PERFORMED  ACharlett Blake5/25/2015, 8:34 AM

## 2013-11-28 ENCOUNTER — Other Ambulatory Visit (HOSPITAL_COMMUNITY): Payer: Self-pay | Admitting: Physical Medicine and Rehabilitation

## 2013-11-28 DIAGNOSIS — I619 Nontraumatic intracerebral hemorrhage, unspecified: Secondary | ICD-10-CM

## 2013-11-28 MED ORDER — SENNOSIDES-DOCUSATE SODIUM 8.6-50 MG PO TABS: 1.0000 | ORAL_TABLET | Freq: Two times a day (BID) | ORAL | Status: DC

## 2013-11-28 MED ORDER — DILTIAZEM HCL 30 MG PO TABS: 30.0000 mg | ORAL_TABLET | Freq: Three times a day (TID) | ORAL | Status: DC

## 2013-11-28 MED ORDER — METHYLPHENIDATE HCL 5 MG PO TABS: 5.0000 mg | ORAL_TABLET | Freq: Two times a day (BID) | ORAL | Status: DC

## 2013-11-28 MED ORDER — ASPIRIN EC 81 MG PO TBEC: 81.0000 mg | DELAYED_RELEASE_TABLET | Freq: Every day | ORAL | Status: DC

## 2013-11-28 MED ORDER — POTASSIUM CHLORIDE CRYS ER 10 MEQ PO TBCR: 10.0000 meq | EXTENDED_RELEASE_TABLET | Freq: Every day | ORAL | Status: DC

## 2013-11-28 NOTE — Progress Notes (Signed)
Social Work Discharge Note Discharge Note  The overall goal for the admission was met for:   Discharge location: Yes-HOME WITH DAUGHTER AND PT'S HUSBAND-DAUGHTER TO PROVIDE 24 HR CARE  Length of Stay: Yes-12 DAYS  Discharge activity level: Yes-SUPERVISION/MIN LEVEL  Home/community participation: Yes  Services provided included: MD, RD, PT, OT, SLP, RN, CM, TR, Pharmacy and SW  Financial Services: Medicare and Private Insurance: [facility]  Follow-up services arranged: Home Health: [facility]-PT,OT,RN,SPT, DME: [facility] and Patient/Family has no preference for HH/DME agencies  Comments (or additional information):DAUGHTER HAS COMPLETED FAMILY EDUCATION AND FEELS COMFORTABLE WITH PT'S CARE  Patient/Family verbalized understanding of follow-up arrangements: Yes  Individual responsible for coordination of the follow-up plan: AUDREY-DAUGHTER  Confirmed correct DME delivered: Elease Hashimoto 11/28/2013    Gardiner Rhyme Keland Peyton

## 2013-11-28 NOTE — Discharge Instructions (Signed)
Inpatient Rehab Discharge Instructions  Shelly [name] Discharge date and time: 11/28/13   Activities/Precautions/ Functional Status: Activity: activity as tolerated Diet:  Soft foods. No straws. Crush medications. Intermittens supervision with meals.  Wound Care: none needed Functional status:  ___ No restrictions     ___ Walk up steps independently _X__ 24/7 supervision/assistance   ___ Walk up steps with assistance ___ Intermittent supervision/assistance  ___ Bathe/dress independently ___ Walk with walker     ___ Bathe/dress with assistance ___ Walk Independently    ___ Shower independently __X_ Walk with assistance    _X__ Shower with assistance _X__ No alcohol     ___ Return to work/school ________  Special Instructions:     COMMUNITY REFERRALS UPON DISCHARGE:    Home Health:   PT, OT, SPT, RN   Agency:[facility] Phone:[phone] Date of last service:11/28/2013  Medical Equipment/Items Ordered:TRANSPORT CHAIR & TUB BENCH  Agency/Supplier:[facility]    [phone]   GENERAL COMMUNITY RESOURCES FOR PATIENT/FAMILY: Support Groups:[facility]PPORT [facility] & [facility]PPORT [facility]  STROKE/TIA DISCHARGE INSTRUCTIONS SMOKING Cigarette smoking nearly doubles your risk of having a stroke & is the single most alterable risk factor  If you smoke or have smoked in the last 12 months, you are advised to quit smoking for your health.  Most of the excess cardiovascular risk related to smoking disappears within a year of stopping.  Ask you doctor about anti-smoking medications  [place] Quit Line: 1-800-QUIT NOW  Free Smoking Cessation Classes [phone]  CHOLESTEROL Know your levels; limit fat & cholesterol in your diet  Lipid Panel     Component Value Date/Time   CHOL  Value: 155        ATP III CLASSIFICATION:  <200     mg/dL   Desirable  664-403  mg/dL   Borderline High  >=474    mg/dL   High        2/59/5638 0125   TRIG 36 03/04/2010 0125   HDL 60 03/04/2010 0125   CHOLHDL  2.6 03/04/2010 0125   VLDL 7 03/04/2010 0125   LDLCALC  Value: 88        Total Cholesterol/HDL:CHD Risk Coronary Heart Disease Risk Table                     Men   Women  1/2 Average Risk   3.4   3.3  Average Risk       5.0   4.4  2 X Average Risk   9.6   7.1  3 X Average Risk  23.4   11.0        Use the calculated Patient Ratio above and the CHD Risk Table to determine the patient's CHD Risk.        ATP III CLASSIFICATION (LDL):  <100     mg/dL   Optimal  756-433  mg/dL   Near or Above                    Optimal  130-159  mg/dL   Borderline  295-188  mg/dL   High  >416     mg/dL   Very High 12/10/3014 0109      Many patients benefit from treatment even if their cholesterol is at goal.  Goal: Total Cholesterol (CHOL) less than 160  Goal:  Triglycerides (TRIG) less than 150  Goal:  HDL greater than 40  Goal:  LDL (LDLCALC) less than 100   BLOOD PRESSURE  American Stroke Association blood pressure target is less that 120/80 mm/Hg  Your discharge blood pressure is:  BP: 127/68 mmHg  Monitor your blood pressure  Limit your salt and alcohol intake  Many individuals will require more than one medication for high blood pressure  DIABETES (A1c is a blood sugar average for last 3 months) Goal HGBA1c is under 7% (HBGA1c is blood sugar average for last 3 months)  Diabetes: No known diagnosis of diabetes       Your HGBA1c can be lowered with medications, healthy diet, and exercise.  Check your blood sugar as directed by your physician  Call your physician if you experience unexplained or low blood sugars.  PHYSICAL ACTIVITY/REHABILITATION Goal is 30 minutes at least 4 days per week  Activity: Increase activity slowly, and No driving, Therapies: See above Return to work: N/A  Activity decreases your risk of heart attack and stroke and makes your heart stronger.  It helps control your weight and blood pressure; helps you relax and can improve your mood.  Participate in a regular exercise  program.  Talk with your doctor about the best form of exercise for you (dancing, walking, swimming, cycling).  DIET/WEIGHT Goal is to maintain a healthy weight  Your discharge diet is: Dysphagia, thin liquids Your height is:  Height: 5\' 1"  (154.9 cm) Your current weight is: Weight: 69.1 kg (152 lb 5.4 oz) Your Body Mass Index (BMI) is:  BMI (Calculated): 28.8  Following the type of diet specifically designed for you will help prevent another stroke.  Your goal weight is:  132 lbs  Your goal Body Mass Index (BMI) is 19-24.  Healthy food habits can help reduce 3 risk factors for stroke:  High cholesterol, hypertension, and excess weight.  RESOURCES Stroke/Support Group:  Call [phone]   STROKE EDUCATION PROVIDED/REVIEWED AND GIVEN TO PATIENT Stroke warning signs and symptoms How to activate emergency medical system (call 911). Medications prescribed at discharge. Need for follow-up after discharge. Personal risk factors for stroke. Pneumonia vaccine given:  Flu vaccine given:  My questions have been answered, the writing is legible, and I understand these instructions.  I will adhere to these goals & educational materials that have been provided to me after my discharge from the hospital.      My questions have been answered and I understand these instructions. I will adhere to these goals and the provided educational materials after my discharge from the hospital.  Patient/Caregiver Signature _______________________________ Date __________  Clinician Signature _______________________________________ Date __________  Please bring this form and your medication list with you to all your follow-up doctor's appointments.

## 2013-11-28 NOTE — Discharge Summary (Signed)
Physician Discharge Summary  Patient ID: Shelly [name] MRN: [id number] DOB/AGE: October 10, 1918 80 y.o.  Admit date: 11/16/2013 Discharge date: 11/28/2013  Discharge Diagnoses:  Principal Problem:   ICH (intracerebral hemorrhage) Active Problems:   Rheumatoid arthritis(714.0)   Hypertension   Chronic kidney disease   Discharged Condition: Stable.   Significant Diagnostic Studies: Ct Head Wo Contrast  11/27/2013   CLINICAL DATA:  Follow-up intracranial hemorrhage.  EXAM: CT HEAD WITHOUT CONTRAST  TECHNIQUE: Contiguous axial images were obtained from the base of the skull through the vertex without intravenous contrast.  COMPARISON:  CT HEAD W/O CM dated 11/14/2013; CT HEAD W/O CM dated 11/13/2013  FINDINGS: 13 mm hypodensity in right inferior basal ganglia at site of prior hemorrhage. Bilateral basal ganglia mineralization.  No intraparenchymal hemorrhage, mass effect or midline shift. Patchy to confluent supratentorial white matter hypodensities again seen without acute large vascular territory infarct.  Basal cisterns are patent. Moderate calcific atherosclerosis the carotid siphons. Trace of sphenoid mucosal thickening without paranasal sinus air-fluid levels. The mastoid air cells are well aerated.  No skull fracture. Ocular globes and orbital contents are nonsuspicious, status post bilateral ocular lens implants.  IMPRESSION: Right inferior basal ganglia infarct at the area of prior hemorrhage with no new bleed nor acute intracranial process.  Involutional changes. Moderate white matter changes suggest chronic small vessel ischemic disease.   Electronically Signed   By: Awilda Metro   On: 11/27/2013 06:35    Labs:  Basic Metabolic Panel:  Recent Labs Lab 11/25/13 0445  NA 133*  K 4.2  CL 96  CO2 27  GLUCOSE 82  BUN 11  CREATININE 1.10  CALCIUM 9.4    CBC:  Recent Labs Lab 11/25/13 0445  WBC 7.0  HGB 9.0*  HCT 27.6*  MCV 69.9*  PLT 303    CBG: No results found for  this basename: GLUCAP,  in the last 168 hours  Brief HPI:   Shelly [name] is a 80 y.o. right-handed female with history of HTN, dementia, RA with chronic prednisone, h/o DVT with chronic Coumadin and chronic renal insufficiency baseline creatinine 1.46. Per reports on 11/11/13, patient attempted to turn the light off, and was found face down on the floor but without any injuries. She was placed back in bed but was noted to be less talkative, with altered mental status, increase in urinary incontinence as well as decreased level of responsiveness since the fall. She was admitted on 11/13/13 for workup and Cranial CT scan showed right caudate head ICH. y Dr. Newell Coral advise conservatived care with serial CCT which has been stable. INR on admission at 1.75 and reversed. Patient with resultant LUE weakness, delayed speech as well as problems processing and CIR was recommended by therapy team.   Hospital Course: Shelly [name] was admitted to rehab 11/16/2013 for inpatient therapies to consist of PT, ST and OT at least three hours five days a week. Past admission physiatrist, therapy team and rehab RN have worked together to provide customized collaborative inpatient rehab. Blood pressures were monitored on bid basis and have been stable.  Renal status has been monitored and has been stable. Potassium supplement was added due to hypokalemia and has resolved. ABLA has been stable with Hgb at 9.0.  Mild headaches have resolved. She was started on ritalin and this has been helped with activation as well as participation in therapies.  Po intake has been variable but ensure supplements were d/c due to reports of diarrhea.  Follow  up CCT was done on 11/27/13 showing  basal ganglia infarct with hemorrhage resolved.  Family had question regarding coumadin and risks of resumption as well as pro's and cons were discussed. She is to start baby ASA past discharge as per neurology recommendations.  Patient has progressed to min  assist level at discharge. She will continue to receive HHPT, HHOT and HHST for follow up therapy as well as HHRN to assist with medication management and further education.    Rehab course: During patient's stay in rehab weekly team conferences were held to monitor patient's progress, set goals and discuss barriers to discharge. Patient has had improvement in activity tolerance, balance, postural control, as well as ability to compensate for deficits.  She is able to perform transfers with close supervision to min assist for complicated situation and/or uneven terrain. She is able to amublate 100 feet with close supervision. She requires steady assist with bathing tasks, min assist for upper body dressing and moderate assist with lower body dressing tasks. She requires min assist with cues for basic cognitive tasks as well as utilization of swallow strategies.   Family education was done with daughter who will provide assistance needed past discharge.   Disposition: 01-Home or Self Care   Diet: Soft foods. No straws. Crush medications. Intermittens supervision with meals.   Wound Care: none needed   Special Instructions: 1. [facility] to provided: PT, OT, SPT, RN  2. Needs 24 hours supervision/assistance. 3. Check lytes in a week and discontinue K dur if hypokalemia resolved.      Medication List    STOP taking these medications       HYDROcodone-acetaminophen 5-325 MG per tablet  Commonly known as:  NORCO/VICODIN     warfarin 4 MG tablet  Commonly known as:  COUMADIN      TAKE these medications       aspirin EC 81 MG tablet  Take 1 tablet (81 mg total) by mouth daily.     CALCIUM 600+D 600-400 MG-UNIT per tablet  Generic drug:  Calcium Carbonate-Vitamin D  Take 1 tablet by mouth 2 (two) times daily.     diltiazem 30 MG tablet  Commonly known as:  CARDIZEM  Take 1 tablet (30 mg total) by mouth 3 (three) times daily.     methylphenidate 5 MG tablet--Rx 60 pills.    Commonly known as:  RITALIN  Take 1 tablet (5 mg total) by mouth 2 (two) times daily with breakfast and lunch. To help with attention and activity level.     mirtazapine 15 MG tablet  Commonly known as:  REMERON  Take 15 mg by mouth at bedtime.     pantoprazole 40 MG tablet  Commonly known as:  PROTONIX  Take 40 mg by mouth daily after breakfast.     potassium chloride 10 MEQ tablet  Commonly known as:  K-DUR,KLOR-CON  Take 1 tablet (10 mEq total) by mouth daily.     predniSONE 5 MG tablet  Commonly known as:  DELTASONE  Take 5 mg by mouth every morning.     senna-docusate 8.6-50 MG per tablet  Commonly known as:  Senokot-S  Take 1 tablet by mouth 2 (two) times daily. Available over the counter           Follow-up Information   Follow up with Erick Colace, MD On 01/15/2014. (Be there at 12 noon )    Specialty:  Physical Medicine and Rehabilitation   Contact information:   [address] [place] [address] [phone]       Follow up with Gates Rigg, MD. Call today. (for follow up appointment in 4 weeks and for input on coumadin )    Specialties:  Neurology, Radiology   Contact information:   [address] [place] [address] [phone]       Follow up with Cain Saupe, MD On 12/05/2013. (APPT @ 10;00 AM) Recheck lytes.   Specialty:  Family Medicine   Contact information:   [address] [place] [place] [address] [phone]       Signed: Jacquelynn Cree 11/28/2013, 4:48 PM

## 2013-11-28 NOTE — Progress Notes (Addendum)
Subjective/Complaints: 80 y.o. right-handed female with history of bilateral TKR, hypertension, baseline dementia, RA with chronic prednisone, DVT with chronic Coumadin and chronic renal insufficiency baseline creatinine 1.46.   Pt aware of D/C today ROS appears more accurate with Yes/No questions Objective: Vital Signs: Blood pressure 150/63, pulse 61, temperature 98.1 F (36.7 C), temperature source Oral, resp. rate 18, height 5\' 1"  (1.549 m), weight 69.1 kg (152 lb 5.4 oz), SpO2 100.00%. Ct Head Wo Contrast  11/27/2013   CLINICAL DATA:  Follow-up intracranial hemorrhage.  EXAM: CT HEAD WITHOUT CONTRAST  TECHNIQUE: Contiguous axial images were obtained from the base of the skull through the vertex without intravenous contrast.  COMPARISON:  CT HEAD W/O CM dated 11/14/2013; CT HEAD W/O CM dated 11/13/2013  FINDINGS: 13 mm hypodensity in right inferior basal ganglia at site of prior hemorrhage. Bilateral basal ganglia mineralization.  No intraparenchymal hemorrhage, mass effect or midline shift. Patchy to confluent supratentorial white matter hypodensities again seen without acute large vascular territory infarct.  Basal cisterns are patent. Moderate calcific atherosclerosis the carotid siphons. Trace of sphenoid mucosal thickening without paranasal sinus air-fluid levels. The mastoid air cells are well aerated.  No skull fracture. Ocular globes and orbital contents are nonsuspicious, status post bilateral ocular lens implants.  IMPRESSION: Right inferior basal ganglia infarct at the area of prior hemorrhage with no new bleed nor acute intracranial process.  Involutional changes. Moderate white matter changes suggest chronic small vessel ischemic disease.   Electronically Signed   By: 01/13/2014   On: 11/27/2013 06:35   No results found for this or any previous visit (from the past 72 hour(s)).    HENT:  Head: Normocephalic and atraumatic.  Edentulous  Eyes: Pupils are equal, round, and  reactive to light.  Neck: Normal range of motion. Neck supple.  Cardiovascular: Normal rate and regular rhythm.  Respiratory: Effort normal and breath sounds normal.  GI: Soft. Bowel sounds are normal. She exhibits no distension. There is no tenderness.  Musculoskeletal: She exhibits edema (pedal edema ).  RA joint deformities bilateral hands and feet. Well healed old B-TKR  Neurological: She is alert.  Oriented to self and place. Able to follow simple one step commands. Fair awareness Mild left central 7. RUE 4- to 4/5 prox to distal with weakness with grip to RA. LUE 3+ to 4/5. LLE 3-/5 HF, KE, 3- ankles. RLE 4 /5. No sensory deficits  Skin: Skin is warm and dry.  Slowed responses, no signs of agitation    Assessment/Plan: 1. Functional deficits secondary to Right caudate ICH on 11/11/2013 which require 3+ hours per day of interdisciplinary therapy in a comprehensive inpatient rehab setting. Stable for D/C today F/u PCP in 1-2 weeks F/u PM&R 3 weeks See D/C summary See D/C instructions FIM: FIM - Bathing Bathing Steps Patient Completed: Chest;Right Arm;Left Arm;Abdomen;Front perineal area;Right upper leg;Left upper leg;Buttocks;Right lower leg (including foot);Left lower leg (including foot) Bathing: 4: Steadying assist  FIM - Upper Body Dressing/Undressing Upper body dressing/undressing steps patient completed: Thread/unthread right sleeve of pullover shirt/dresss;Thread/unthread left sleeve of pullover shirt/dress;Thread/unthread right bra strap;Thread/unthread left bra strap;Put head through opening of pull over shirt/dress;Pull shirt over trunk Upper body dressing/undressing: 4: Min-Patient completed 75 plus % of tasks FIM - Lower Body Dressing/Undressing Lower body dressing/undressing steps patient completed: Thread/unthread right underwear leg;Thread/unthread left underwear leg;Thread/unthread right pants leg;Thread/unthread left pants leg;Pull pants up/down Lower body  dressing/undressing: 3: Mod-Patient completed 50-74% of tasks  FIM - Toileting Toileting steps completed by  patient: Performs perineal hygiene;Adjust clothing prior to toileting Toileting Assistive Devices: Grab bar or rail for support Toileting: 3: Mod-Patient completed 2 of 3 steps  FIM - Diplomatic Services operational officer Devices: Art gallery manager Transfers: 4-From toilet/BSC: Min A (steadying Pt. > 75%);4-To toilet/BSC: Min A (steadying Pt. > 75%)  FIM - Banker Devices: Walker;Arm rests Bed/Chair Transfer: 6: Supine > Sit: No assist;5: Bed > Chair or W/C: Supervision (verbal cues/safety issues);4: Chair or W/C > Bed: Min A (steadying Pt. > 75%)  FIM - Locomotion: Wheelchair Distance: 25 Locomotion: Wheelchair: 1: Travels less than 50 ft with minimal assistance (Pt.>75%) FIM - Locomotion: Ambulation Locomotion: Ambulation Assistive Devices: Designer, industrial/product Ambulation/Gait Assistance: 5: Supervision Locomotion: Ambulation: 2: Travels 50 - 149 ft with supervision/safety issues  Comprehension Comprehension Mode: Auditory Comprehension: 4-Understands basic 75 - 89% of the time/requires cueing 10 - 24% of the time  Expression Expression Mode: Verbal Expression: 5-Expresses basic needs/ideas: With extra time/assistive device  Social Interaction Social Interaction: 5-Interacts appropriately 90% of the time - Needs monitoring or encouragement for participation or interaction.  Problem Solving Problem Solving: 4-Solves basic 75 - 89% of the time/requires cueing 10 - 24% of the time  Memory Memory: 3-Recognizes or recalls 50 - 74% of the time/requires cueing 25 - 49% of the time  Medical Problem List and Plan:  1. Functional deficits secondary to Right caudate ICH  2. PE/ H/o DVT/Anticoagulation: Mechanical: Antiembolism stockings, knee (TED hose) Bilateral lower extremities  Sequential compression devices, below knee Bilateral lower  extremities  3. Pain Management:   Vicodin every 6 hours prn  4. Mood: Continue Remeron at bedtime. LCSW to follow for evaluation and support.  5. Mild dementia/Neuropsych: Did not tolerate Aricept trial due to SE. This patient is not capable of making decisions on her own behalf.  6. HTN: Will monitor BP every 8 hours. Continue cardizem tid at 30mg  due to bradycardia--may need to reduce further if persistent. 7. CKD: Baseline Cr-1.4 at admission. Improved to 1.08, K+ replacement 8. RA:   continue chronic steroids.CHronic hand and shoulder pain per pt, analgesics 9. ?hyperglycemia: sugars normal---dc checks 10.  Anemia- stable may be related to chronic disease but lower than at admit to hospital , follow up with PCP as outpt  LOS (Days) 12 A FACE TO FACE EVALUATION WAS PERFORMED  11/28/2013, 7:20 AM

## 2013-12-08 DIAGNOSIS — N189 Chronic kidney disease, unspecified: Secondary | ICD-10-CM

## 2013-12-08 DIAGNOSIS — IMO0002 Reserved for concepts with insufficient information to code with codable children: Secondary | ICD-10-CM

## 2013-12-08 DIAGNOSIS — M069 Rheumatoid arthritis, unspecified: Secondary | ICD-10-CM

## 2013-12-08 DIAGNOSIS — I129 Hypertensive chronic kidney disease with stage 1 through stage 4 chronic kidney disease, or unspecified chronic kidney disease: Secondary | ICD-10-CM | POA: Diagnosis not present

## 2013-12-10 ENCOUNTER — Emergency Department (HOSPITAL_COMMUNITY): Payer: Medicare Other

## 2013-12-10 ENCOUNTER — Observation Stay (HOSPITAL_BASED_OUTPATIENT_CLINIC_OR_DEPARTMENT_OTHER)
Admission: EM | Admit: 2013-12-10 | Discharge: 2013-12-11 | Disposition: A | Discharge status: Home / Self Care | Payer: Medicare Other | Source: Home / Self Care | Attending: Emergency Medicine | Admitting: Emergency Medicine

## 2013-12-10 ENCOUNTER — Encounter (HOSPITAL_COMMUNITY): Payer: Self-pay | Admitting: Emergency Medicine

## 2013-12-10 DIAGNOSIS — I619 Nontraumatic intracerebral hemorrhage, unspecified: Secondary | ICD-10-CM

## 2013-12-10 DIAGNOSIS — E860 Dehydration: Secondary | ICD-10-CM

## 2013-12-10 DIAGNOSIS — I629 Nontraumatic intracranial hemorrhage, unspecified: Secondary | ICD-10-CM

## 2013-12-10 DIAGNOSIS — I2699 Other pulmonary embolism without acute cor pulmonale: Secondary | ICD-10-CM

## 2013-12-10 DIAGNOSIS — N189 Chronic kidney disease, unspecified: Secondary | ICD-10-CM

## 2013-12-10 DIAGNOSIS — R4182 Altered mental status, unspecified: Secondary | ICD-10-CM

## 2013-12-10 DIAGNOSIS — R55 Syncope and collapse: Secondary | ICD-10-CM

## 2013-12-10 DIAGNOSIS — R509 Fever, unspecified: Secondary | ICD-10-CM

## 2013-12-10 DIAGNOSIS — G9340 Encephalopathy, unspecified: Secondary | ICD-10-CM

## 2013-12-10 DIAGNOSIS — I69359 Hemiplegia and hemiparesis following cerebral infarction affecting unspecified side: Secondary | ICD-10-CM

## 2013-12-10 DIAGNOSIS — N390 Urinary tract infection, site not specified: Secondary | ICD-10-CM

## 2013-12-10 DIAGNOSIS — M069 Rheumatoid arthritis, unspecified: Secondary | ICD-10-CM | POA: Diagnosis present

## 2013-12-10 DIAGNOSIS — Z8673 Personal history of transient ischemic attack (TIA), and cerebral infarction without residual deficits: Secondary | ICD-10-CM

## 2013-12-10 DIAGNOSIS — I10 Essential (primary) hypertension: Secondary | ICD-10-CM

## 2013-12-10 DIAGNOSIS — Z86711 Personal history of pulmonary embolism: Secondary | ICD-10-CM

## 2013-12-10 LAB — PROTIME-INR
INR: 1.2 (ref 0.00–1.49)
Prothrombin Time: 14.9 seconds (ref 11.6–15.2)

## 2013-12-10 LAB — URINALYSIS, ROUTINE W REFLEX MICROSCOPIC
Bilirubin Urine: NEGATIVE
Glucose, UA: NEGATIVE mg/dL
Hgb urine dipstick: NEGATIVE
Ketones, ur: NEGATIVE mg/dL
Nitrite: NEGATIVE
Protein, ur: NEGATIVE mg/dL
Specific Gravity, Urine: 1.015 (ref 1.005–1.030)
Urobilinogen, UA: 1 mg/dL (ref 0.0–1.0)
pH: 7.5 (ref 5.0–8.0)

## 2013-12-10 LAB — CBC
HCT: 31.2 % — ABNORMAL LOW (ref 36.0–46.0)
Hemoglobin: 10.4 g/dL — ABNORMAL LOW (ref 12.0–15.0)
MCH: 23.6 pg — ABNORMAL LOW (ref 26.0–34.0)
MCHC: 33.3 g/dL (ref 30.0–36.0)
MCV: 70.7 fL — ABNORMAL LOW (ref 78.0–100.0)
Platelets: 252 10*3/uL (ref 150–400)
RBC: 4.41 MIL/uL (ref 3.87–5.11)
RDW: 16.9 % — ABNORMAL HIGH (ref 11.5–15.5)
WBC: 10.1 10*3/uL (ref 4.0–10.5)

## 2013-12-10 LAB — COMPREHENSIVE METABOLIC PANEL
ALT: 8 U/L (ref 0–35)
AST: 17 U/L (ref 0–37)
Albumin: 3.5 g/dL (ref 3.5–5.2)
Alkaline Phosphatase: 45 U/L (ref 39–117)
BUN: 10 mg/dL (ref 6–23)
CO2: 27 mEq/L (ref 19–32)
Calcium: 10.1 mg/dL (ref 8.4–10.5)
Chloride: 97 mEq/L (ref 96–112)
Creatinine, Ser: 1.34 mg/dL — ABNORMAL HIGH (ref 0.50–1.10)
GFR calc Af Amer: 39 mL/min — ABNORMAL LOW (ref >90)
GFR calc non Af Amer: 33 mL/min — ABNORMAL LOW (ref >90)
Glucose, Bld: 101 mg/dL — ABNORMAL HIGH (ref 70–99)
Potassium: 3.9 mEq/L (ref 3.7–5.3)
Sodium: 138 mEq/L (ref 137–147)
Total Bilirubin: 0.6 mg/dL (ref 0.3–1.2)
Total Protein: 8.5 g/dL — ABNORMAL HIGH (ref 6.0–8.3)

## 2013-12-10 LAB — DIFFERENTIAL
Basophils Absolute: 0 10*3/uL (ref 0.0–0.1)
Basophils Relative: 0 % (ref 0–1)
Eosinophils Absolute: 0.2 10*3/uL (ref 0.0–0.7)
Eosinophils Relative: 2 % (ref 0–5)
Lymphocytes Relative: 24 % (ref 12–46)
Lymphs Abs: 2.4 10*3/uL (ref 0.7–4.0)
Monocytes Absolute: 1.2 10*3/uL — ABNORMAL HIGH (ref 0.1–1.0)
Monocytes Relative: 12 % (ref 3–12)
Neutro Abs: 6.3 10*3/uL (ref 1.7–7.7)
Neutrophils Relative %: 62 % (ref 43–77)

## 2013-12-10 LAB — I-STAT TROPONIN, ED: Troponin i, poc: 0.01 ng/mL (ref 0.00–0.08)

## 2013-12-10 LAB — URINE MICROSCOPIC-ADD ON

## 2013-12-10 LAB — APTT: aPTT: 29 seconds (ref 24–37)

## 2013-12-10 MED ORDER — PANTOPRAZOLE SODIUM 40 MG PO TBEC
40.0000 mg | DELAYED_RELEASE_TABLET | Freq: Every day | ORAL | Status: DC
  Administered 2013-12-11: 40 mg via ORAL
  Filled 2013-12-10: qty 1

## 2013-12-10 MED ORDER — ACETAMINOPHEN 650 MG RE SUPP: 650.0000 mg | Freq: Four times a day (QID) | RECTAL | Status: DC | PRN

## 2013-12-10 MED ORDER — DEXTROSE 5 % IV SOLN
1.0000 g | INTRAVENOUS | Status: DC
  Filled 2013-12-10: qty 10

## 2013-12-10 MED ORDER — SODIUM CHLORIDE 0.9 % IV SOLN
INTRAVENOUS | Status: DC
  Administered 2013-12-11: 05:00:00 via INTRAVENOUS

## 2013-12-10 MED ORDER — DILTIAZEM HCL 30 MG PO TABS
30.0000 mg | ORAL_TABLET | Freq: Three times a day (TID) | ORAL | Status: DC
  Administered 2013-12-11: 30 mg via ORAL
  Filled 2013-12-10 (×3): qty 1

## 2013-12-10 MED ORDER — ASPIRIN EC 81 MG PO TBEC
81.0000 mg | DELAYED_RELEASE_TABLET | Freq: Every day | ORAL | Status: DC
  Administered 2013-12-11: 81 mg via ORAL
  Filled 2013-12-10: qty 1

## 2013-12-10 MED ORDER — ONDANSETRON HCL 4 MG PO TABS: 4.0000 mg | ORAL_TABLET | Freq: Four times a day (QID) | ORAL | Status: DC | PRN

## 2013-12-10 MED ORDER — SENNOSIDES-DOCUSATE SODIUM 8.6-50 MG PO TABS
1.0000 | ORAL_TABLET | Freq: Two times a day (BID) | ORAL | Status: DC
  Administered 2013-12-10 – 2013-12-11 (×2): 1 via ORAL
  Filled 2013-12-10 (×2): qty 1

## 2013-12-10 MED ORDER — SENNOSIDES-DOCUSATE SODIUM 8.6-50 MG PO TABS: 1.0000 | ORAL_TABLET | Freq: Every evening | ORAL | Status: DC | PRN

## 2013-12-10 MED ORDER — PREDNISONE 5 MG PO TABS
5.0000 mg | ORAL_TABLET | Freq: Every morning | ORAL | Status: DC
  Administered 2013-12-11: 5 mg via ORAL
  Filled 2013-12-10: qty 1

## 2013-12-10 MED ORDER — MIRTAZAPINE 15 MG PO TABS
15.0000 mg | ORAL_TABLET | Freq: Every day | ORAL | Status: DC
  Administered 2013-12-10: 15 mg via ORAL
  Filled 2013-12-10 (×2): qty 1

## 2013-12-10 MED ORDER — DEXTROSE 5 % IV SOLN
1.0000 g | Freq: Once | INTRAVENOUS | Status: AC
  Administered 2013-12-10: 1 g via INTRAVENOUS
  Filled 2013-12-10: qty 10

## 2013-12-10 MED ORDER — ACETAMINOPHEN 325 MG PO TABS: 650.0000 mg | ORAL_TABLET | Freq: Four times a day (QID) | ORAL | Status: DC | PRN

## 2013-12-10 MED ORDER — ONDANSETRON HCL 4 MG/2ML IJ SOLN: 4.0000 mg | Freq: Four times a day (QID) | INTRAMUSCULAR | Status: DC | PRN

## 2013-12-10 NOTE — ED Notes (Signed)
EKG completed given to EDP.  

## 2013-12-10 NOTE — ED Notes (Signed)
Daughter reports that pt was discharged from [facility] on 5/26 after rehab from a CVA.  Reports since waking up this morning pt is not talking as much, is disoriented, daughter has to repeat instructions for pt, and difficulty walking.  Pt alert and oriented to person and place at this time. Daughter reports last seen well last night.

## 2013-12-10 NOTE — ED Provider Notes (Signed)
CSN: [id number]     Arrival date & time 12/10/13  1651 History   First MD Initiated Contact with Patient 12/10/13 1736     Chief Complaint  Patient presents with  . Stroke Symptoms  . Altered Mental Status     (Consider location/radiation/quality/duration/timing/severity/associated sxs/prior Treatment) HPI Comments: Patient presents to the emergency department with chief complaint of altered mental status. She is accompanied by her daughter, who states that when the patient awakened this morning, she seemed slower to respond than normal, though she denies slurred speech, or weakness. Patient was recently admitted for stroke, and has been staying in a rehabilitation center. Last seen normal per the daughter last night before bed. No reported coughing, vomiting, or other symptoms. Additionally, the daughter states that patient has not been eating quite as much, and seems slightly disoriented. No reported fevers or chills, but the patient is mildly febrile here today. No reported cough or dysuria. There are no aggravating or alleviating factors.  The history is provided by the patient. No language interpreter was used.    Past Medical History  Diagnosis Date  . Hypertension   . Bronchitis     "not chronic; not recurrent" (11/13/2013)  . Chronic anemia   . GERD (gastroesophageal reflux disease)   . Pulmonary embolism   . DVT (deep venous thrombosis)     "RLE?"  . Rheumatoid arthritis   . Stroke     11/13/2013 "she has a slow bleed"  . Chronic kidney disease (CKD), stage II (mild)     Stage II, creatine clearance 40-50 cc /minute   Past Surgical History  Procedure Laterality Date  . Replacement total knee bilateral Bilateral   . Joint replacement    . Tubal ligation    . Carpal tunnel release    . Dilation and curettage of uterus    . Cataract extraction w/ intraocular lens  implant, bilateral Bilateral    Family History  Problem Relation Age of Onset  . Hypertension Daughter     History  Substance Use Topics  . Smoking status: Never Smoker   . Smokeless tobacco: Never Used  . Alcohol Use: No   OB History   Grav Para Term Preterm Abortions TAB SAB Ect Mult Living                 Review of Systems  Constitutional: Negative for fever and chills.  Respiratory: Negative for shortness of breath.   Cardiovascular: Negative for chest pain.  Gastrointestinal: Negative for nausea, vomiting, diarrhea and constipation.  Genitourinary: Negative for dysuria.  Psychiatric/Behavioral: Positive for confusion.      Allergies  Aricept  Home Medications   Prior to Admission medications   Medication Sig Start Date End Date Taking? Authorizing Provider  aspirin EC 81 MG tablet Take 1 tablet (81 mg total) by mouth daily. 11/28/13   Jacquelynn CreePamela S Love, PA-C  Calcium Carbonate-Vitamin D (CALCIUM 600+D) 600-400 MG-UNIT per tablet Take 1 tablet by mouth 2 (two) times daily.    Historical Provider, MD  diltiazem (CARDIZEM) 30 MG tablet Take 1 tablet (30 mg total) by mouth 3 (three) times daily. 11/28/13   Jacquelynn CreePamela S Love, PA-C  methylphenidate (RITALIN) 5 MG tablet Take 1 tablet (5 mg total) by mouth 2 (two) times daily with breakfast and lunch. To help with attention and activity level. 11/28/13   Evlyn KannerPamela S Love, PA-C  mirtazapine (REMERON) 15 MG tablet Take 15 mg by mouth at bedtime.    Historical Provider, MD  pantoprazole (PROTONIX) 40 MG tablet Take 40 mg by mouth daily after breakfast.     Historical Provider, MD  potassium chloride (K-DUR,KLOR-CON) 10 MEQ tablet Take 1 tablet (10 mEq total) by mouth daily. 11/28/13   Jacquelynn Cree, PA-C  predniSONE (DELTASONE) 5 MG tablet Take 5 mg by mouth every morning.     Historical Provider, MD  senna-docusate (SENOKOT-S) 8.6-50 MG per tablet Take 1 tablet by mouth 2 (two) times daily. Available over the counter 11/28/13   Evlyn Kanner Love, PA-C   BP 124/94  Pulse 63  Temp(Src) 100.2 F (37.9 C) (Oral)  Resp 18  SpO2 96% Physical Exam   Nursing note and vitals reviewed. Constitutional: She is oriented to person, place, and time. She appears well-developed and well-nourished.  HENT:  Head: Normocephalic and atraumatic.  Eyes: Conjunctivae and EOM are normal. Pupils are equal, round, and reactive to light.  Neck: Normal range of motion. Neck supple.  Cardiovascular: Normal rate and regular rhythm.  Exam reveals no gallop and no friction rub.   No murmur heard. Pulmonary/Chest: Effort normal and breath sounds normal. No respiratory distress. She has no wheezes. She has no rales. She exhibits no tenderness.  Clear to auscultation  Abdominal: Soft. Bowel sounds are normal. She exhibits no distension and no mass. There is no tenderness. There is no rebound and no guarding.  No focal abdominal tenderness, no RLQ tenderness or pain at McBurney's point, no RUQ tenderness or Murphy's sign, no left-sided abdominal tenderness, no fluid wave, or signs of peritonitis   Musculoskeletal: Normal range of motion. She exhibits no edema and no tenderness.  Neurological: She is alert and oriented to person, place, and time.  CN 3-12 intact, pronator drift for secondary to confusion, left upper extremity strength is 4/5, baseline since recent stroke, right upper extremity range of motion and strength 5/5, lower extremities full range of motion and strength  Skin: Skin is warm and dry.  Psychiatric: She has a normal mood and affect. Her behavior is normal. Judgment and thought content normal.    ED Course  Procedures (including critical care time) Results for orders placed during the hospital encounter of 12/10/13  PROTIME-INR      Result Value Ref Range   Prothrombin Time 14.9  11.6 - 15.2 seconds   INR 1.20  0.00 - 1.49  APTT      Result Value Ref Range   aPTT 29  24 - 37 seconds  CBC      Result Value Ref Range   WBC 10.1  4.0 - 10.5 K/uL   RBC 4.41  3.87 - 5.11 MIL/uL   Hemoglobin 10.4 (*) 12.0 - 15.0 g/dL   HCT 27.0 (*) 62.3 -  46.0 %   MCV 70.7 (*) 78.0 - 100.0 fL   MCH 23.6 (*) 26.0 - 34.0 pg   MCHC 33.3  30.0 - 36.0 g/dL   RDW 76.2 (*) 83.1 - 51.7 %   Platelets 252  150 - 400 K/uL  DIFFERENTIAL      Result Value Ref Range   Neutrophils Relative % 62  43 - 77 %   Neutro Abs 6.3  1.7 - 7.7 K/uL   Lymphocytes Relative 24  12 - 46 %   Lymphs Abs 2.4  0.7 - 4.0 K/uL   Monocytes Relative 12  3 - 12 %   Monocytes Absolute 1.2 (*) 0.1 - 1.0 K/uL   Eosinophils Relative 2  0 - 5 %  Eosinophils Absolute 0.2  0.0 - 0.7 K/uL   Basophils Relative 0  0 - 1 %   Basophils Absolute 0.0  0.0 - 0.1 K/uL  COMPREHENSIVE METABOLIC PANEL      Result Value Ref Range   Sodium 138  137 - 147 mEq/L   Potassium 3.9  3.7 - 5.3 mEq/L   Chloride 97  96 - 112 mEq/L   CO2 27  19 - 32 mEq/L   Glucose, Bld 101 (*) 70 - 99 mg/dL   BUN 10  6 - 23 mg/dL   Creatinine, Ser 4.09 (*) 0.50 - 1.10 mg/dL   Calcium 81.1  8.4 - 91.4 mg/dL   Total Protein 8.5 (*) 6.0 - 8.3 g/dL   Albumin 3.5  3.5 - 5.2 g/dL   AST 17  0 - 37 U/L   ALT 8  0 - 35 U/L   Alkaline Phosphatase 45  39 - 117 U/L   Total Bilirubin 0.6  0.3 - 1.2 mg/dL   GFR calc non Af Amer 33 (*) >90 mL/min   GFR calc Af Amer 39 (*) >90 mL/min  URINALYSIS, ROUTINE W REFLEX MICROSCOPIC      Result Value Ref Range   Color, Urine YELLOW  YELLOW   APPearance CLOUDY (*) CLEAR   Specific Gravity, Urine 1.015  1.005 - 1.030   pH 7.5  5.0 - 8.0   Glucose, UA NEGATIVE  NEGATIVE mg/dL   Hgb urine dipstick NEGATIVE  NEGATIVE   Bilirubin Urine NEGATIVE  NEGATIVE   Ketones, ur NEGATIVE  NEGATIVE mg/dL   Protein, ur NEGATIVE  NEGATIVE mg/dL   Urobilinogen, UA 1.0  0.0 - 1.0 mg/dL   Nitrite NEGATIVE  NEGATIVE   Leukocytes, UA SMALL (*) NEGATIVE  URINE MICROSCOPIC-ADD ON      Result Value Ref Range   WBC, UA 11-20  <3 WBC/hpf   RBC / HPF 0-2  <3 RBC/hpf   Bacteria, UA MANY (*) RARE  I-STAT TROPOININ, ED      Result Value Ref Range   Troponin i, poc 0.01  0.00 - 0.08 ng/mL   Comment 3             Dg Chest 1 View  12/10/2013   CLINICAL DATA:  Altered mental status.  EXAM: CHEST - 1 VIEW  COMPARISON:  PA and lateral chest 10/21/2012 11/13/2013. CT chest 09/11/2009.  FINDINGS: Pulmonary fibrotic change and scattered bronchiectasis, most notable in the bases, are again seen. There is some subsegmental atelectasis in the left lung base. Right lung is clear. Heart size is upper normal.  IMPRESSION: No acute disease.  Stable compared to prior exam.   Electronically Signed   By: Drusilla Kanner M.D.   On: 12/10/2013 18:35   Dg Chest 2 View  11/13/2013   CLINICAL DATA:  Altered mental status  EXAM: CHEST  2 VIEW  COMPARISON:  None.  FINDINGS: Shallow inspiration. Borderline heart size with normal pulmonary vascularity. Interstitial changes consistent with fibrosis. Bronchial wall thickening suggesting chronic bronchitis. No focal airspace disease or consolidation. Vertebral compression deformities, stable since prior study.  IMPRESSION: Borderline heart size. Interstitial fibrosis and chronic bronchitic changes in the lungs. No focal consolidation.   Electronically Signed   By: Burman Nieves M.D.   On: 11/13/2013 06:15   Ct Head (brain) Wo Contrast  12/10/2013   CLINICAL DATA:  Disorientation.  Recent CVA.  EXAM: CT HEAD WITHOUT CONTRAST  TECHNIQUE: Contiguous axial images were obtained from the base  of the skull through the vertex without contrast.  COMPARISON:  None  FINDINGS: Mild atrophy and small vessel disease. Continued resolution of hemorrhagic inferior right caudate lesion, with no new areas of hemorrhage or infarction. Bilateral basal ganglia calcification. No hydrocephalus, mass lesion, or extra-axial fluid. Calvarium intact. Venous lakes. Vascular calcification. No sinus disease.  IMPRESSION: Resolving right caudate hemorrhage. No acute intracranial abnormality. Chronic changes as described.   Electronically Signed   By: Davonna Belling M.D.   On: 12/10/2013 18:49   Ct Head Wo  Contrast  11/27/2013   CLINICAL DATA:  Follow-up intracranial hemorrhage.  EXAM: CT HEAD WITHOUT CONTRAST  TECHNIQUE: Contiguous axial images were obtained from the base of the skull through the vertex without intravenous contrast.  COMPARISON:  CT HEAD W/O CM dated 11/14/2013; CT HEAD W/O CM dated 11/13/2013  FINDINGS: 13 mm hypodensity in right inferior basal ganglia at site of prior hemorrhage. Bilateral basal ganglia mineralization.  No intraparenchymal hemorrhage, mass effect or midline shift. Patchy to confluent supratentorial white matter hypodensities again seen without acute large vascular territory infarct.  Basal cisterns are patent. Moderate calcific atherosclerosis the carotid siphons. Trace of sphenoid mucosal thickening without paranasal sinus air-fluid levels. The mastoid air cells are well aerated.  No skull fracture. Ocular globes and orbital contents are nonsuspicious, status post bilateral ocular lens implants.  IMPRESSION: Right inferior basal ganglia infarct at the area of prior hemorrhage with no new bleed nor acute intracranial process.  Involutional changes. Moderate white matter changes suggest chronic small vessel ischemic disease.   Electronically Signed   By: Awilda Metro   On: 11/27/2013 06:35   Ct Head Wo Contrast  11/14/2013   CLINICAL DATA:  Altered mental status, recent intracranial hemorrhage after fall  EXAM: CT HEAD WITHOUT CONTRAST  TECHNIQUE: Contiguous axial images were obtained from the base of the skull through the vertex without intravenous contrast.  COMPARISON:  CT HEAD W/O CM dated 11/13/2013; CT C SPINE W/O CM dated 11/13/2013; CT HEAD W/O CM dated 10/21/2012 is  FINDINGS: Moderate atrophy and low attenuation in the deep white matter consistent with chronic involutional change. Age-related basal ganglia calcification and calcifications in the falx, stable. There is again high attenuation in the right caudate, unchanged at 18 x 10 mm. There is mild mass effect on the  frontal horn of the right lateral ventricle, unchanged. There is no hydrocephalus but there is a small volume of hemorrhage layering dependently in both lateral ventricles.  No evidence of low attenuation to suggest vascular territory infarct.  IMPRESSION: Stable focus of hyperattenuation in the right caudate, again consistent with hemorrhage. Small volume hemorrhage layering dependently in both ventricles. No hydrocephalus.   Electronically Signed   By: Esperanza Heir M.D.   On: 11/14/2013 19:33   Ct Head Wo Contrast  11/13/2013   CLINICAL DATA:  Altered mental status, fall, on Coumadin.  EXAM: CT HEAD WITHOUT CONTRAST  CT CERVICAL SPINE WITHOUT CONTRAST  TECHNIQUE: Multidetector CT imaging of the head and cervical spine was performed following the standard protocol without intravenous contrast. Multiplanar CT image reconstructions of the cervical spine were also generated.  COMPARISON:  CT HEAD W/O CM dated 10/21/2012  FINDINGS: CT HEAD FINDINGS  The ventricles and sulci are normal for age. 11 mm round density within the right caudate head, axial 8/11. No intraparenchymal mass effect nor midline shift. Confluent supratentorial white matter hypodensities are within normal range for patient's age and though non-specific suggest sequelae of chronic small vessel  ischemic disease. No acute large vascular territory infarcts.  No abnormal extra-axial fluid collections. Stable subcentimeter calcification along the interhemispheric fissure. Basal cisterns are patent. Moderate calcific atherosclerosis of the carotid siphons.  No skull fracture. The included ocular globes and orbital contents are non-suspicious. The mastoid aircells and included paranasal sinuses are well-aerated.  CT CERVICAL SPINE FINDINGS  Cervical vertebral bodies and posterior elements are intact and aligned with straightened cervical lordosis. Moderate to severe C3-4, C5-6 and C6-7 degenerative disc disease. Atlantodental interval is 2.5 mm,  widened. No destructive bony lesions. Bone mineral density is decreased. Heterogeneous thyroid gland.  IMPRESSION: CT head: 11 mm round density in right caudate head likely reflects acute hemorrhage without mass effect.  Involutional changes. Moderate to severe white matter changes may reflect chronic small vessel ischemic disease.  CT cervical spine: Straightened cervical lordosis without acute fracture nor malalignment. However, mildly widened atlantodental interval can be seen with ligamentous laxity or injury. If clinically indicated consider follow-up MRI of the cervical spine.  Findings discussed with and reconfirmed by Dr.Nanavati on5/11/2015at6:39 AM.   Electronically Signed   By: Awilda Metro   On: 11/13/2013 06:40   Ct Cervical Spine Wo Contrast  11/13/2013   CLINICAL DATA:  Altered mental status, fall, on Coumadin.  EXAM: CT HEAD WITHOUT CONTRAST  CT CERVICAL SPINE WITHOUT CONTRAST  TECHNIQUE: Multidetector CT imaging of the head and cervical spine was performed following the standard protocol without intravenous contrast. Multiplanar CT image reconstructions of the cervical spine were also generated.  COMPARISON:  CT HEAD W/O CM dated 10/21/2012  FINDINGS: CT HEAD FINDINGS  The ventricles and sulci are normal for age. 11 mm round density within the right caudate head, axial 8/11. No intraparenchymal mass effect nor midline shift. Confluent supratentorial white matter hypodensities are within normal range for patient's age and though non-specific suggest sequelae of chronic small vessel ischemic disease. No acute large vascular territory infarcts.  No abnormal extra-axial fluid collections. Stable subcentimeter calcification along the interhemispheric fissure. Basal cisterns are patent. Moderate calcific atherosclerosis of the carotid siphons.  No skull fracture. The included ocular globes and orbital contents are non-suspicious. The mastoid aircells and included paranasal sinuses are well-aerated.   CT CERVICAL SPINE FINDINGS  Cervical vertebral bodies and posterior elements are intact and aligned with straightened cervical lordosis. Moderate to severe C3-4, C5-6 and C6-7 degenerative disc disease. Atlantodental interval is 2.5 mm, widened. No destructive bony lesions. Bone mineral density is decreased. Heterogeneous thyroid gland.  IMPRESSION: CT head: 11 mm round density in right caudate head likely reflects acute hemorrhage without mass effect.  Involutional changes. Moderate to severe white matter changes may reflect chronic small vessel ischemic disease.  CT cervical spine: Straightened cervical lordosis without acute fracture nor malalignment. However, mildly widened atlantodental interval can be seen with ligamentous laxity or injury. If clinically indicated consider follow-up MRI of the cervical spine.  Findings discussed with and reconfirmed by Dr.Nanavati on5/11/2015at6:39 AM.   Electronically Signed   By: Awilda Metro   On: 11/13/2013 06:40      EKG Interpretation None      MDM   Final diagnoses:  Altered mental status  UTI (lower urinary tract infection)    Today, after the patient was confused and disoriented this morning. She was found to be mildly febrile in the emergency department. Will do altered mental status workup on the patient. Anticipate admission.  9:07 PM Discussed patient with Dr. Fayrene Fearing, recommends observation admission with treatment of UTI to  ensure that confusion resolves.  Discussed patient with Dr. Lendell Caprice, from [facility], who will admit the patient.    Roxy Horseman, PA-C 12/10/13 2107

## 2013-12-10 NOTE — H&P (Addendum)
[facility] History and Physical  Shelly [name] ZOX:[id number] DOB: [birth year] DOA: 12/10/2013  Referring physician: EDP PCP: Cain Saupe, MD   Chief Complaint: "disoriented"  HPI: Shelly [name] is a 80 y.o. female  With history of recent hemorrhagic stroke and residual left-sided weakness, brought to the emergency room by family who provide history. Patient seemed confused and disoriented today. She had difficulty following instructions. She had no change in her left-sided weakness from previous stroke. She had been in her usual state of health yesterday. No reports of fevers, chills, nausea vomiting diarrhea. No dysuria. In the emergency room showed a low-grade fever of 100.2 and urinalysis consistent with mild urinary tract infection. CAT scan of the brain showed resolving right caudate hemorrhage. Nothing acute. At baseline, the patient's daughter reports that she does not reliably know date, nor current events.  Patient has been doing well since discharge from inpatient rehabilitation. She lives with her daughter and gets around by walker.  Review of Systems:  Difficult due to confusion.  Past Medical History  Diagnosis Date  . Hypertension   . Bronchitis     "not chronic; not recurrent" (11/13/2013)  . Chronic anemia   . GERD (gastroesophageal reflux disease)   . Pulmonary embolism   . DVT (deep venous thrombosis)     "RLE?"  . Rheumatoid arthritis   . Stroke     11/13/2013 "she has a slow bleed"  . Chronic kidney disease (CKD), stage II (mild)     Stage II, creatine clearance 40-50 cc /minute   Past Surgical History  Procedure Laterality Date  . Replacement total knee bilateral Bilateral   . Joint replacement    . Tubal ligation    . Carpal tunnel release    . Dilation and curettage of uterus    . Cataract extraction w/ intraocular lens  implant, bilateral Bilateral    Social History:  reports that she has never smoked. She has never used smokeless tobacco. She  reports that she does not drink alcohol or use illicit drugs.  Allergies  Allergen Reactions  . Aricept [Donepezil Hcl] Nausea And Vomiting    She becomes violently ill per daughter.    Family History  Problem Relation Age of Onset  . Hypertension Daughter      Prior to Admission medications   Medication Sig Start Date End Date Taking? Authorizing Provider  aspirin EC 81 MG tablet Take 1 tablet (81 mg total) by mouth daily. 11/28/13  Yes Evlyn Kanner Love, PA-C  Calcium Carbonate-Vitamin D (CALCIUM 600+D) 600-400 MG-UNIT per tablet Take 1 tablet by mouth 2 (two) times daily.   Yes Historical Provider, MD  diltiazem (CARDIZEM) 30 MG tablet Take 1 tablet (30 mg total) by mouth 3 (three) times daily. 11/28/13  Yes Evlyn Kanner Love, PA-C  methylphenidate (RITALIN) 5 MG tablet Take 1 tablet (5 mg total) by mouth 2 (two) times daily with breakfast and lunch. To help with attention and activity level. 11/28/13  Yes Evlyn Kanner Love, PA-C  mirtazapine (REMERON) 15 MG tablet Take 15 mg by mouth at bedtime.   Yes Historical Provider, MD  pantoprazole (PROTONIX) 40 MG tablet Take 40 mg by mouth daily after breakfast.    Yes Historical Provider, MD  predniSONE (DELTASONE) 5 MG tablet Take 5 mg by mouth every morning.    Yes Historical Provider, MD  senna-docusate (SENOKOT-S) 8.6-50 MG per tablet Take 1 tablet by mouth 2 (two) times daily. Available over the counter 11/28/13  Yes  Jacquelynn Cree, PA-C   Physical Exam: Filed Vitals:   12/10/13 2130  BP: 134/95  Pulse: 98  Temp:   Resp: 19   BP 122/89  Pulse 74  Temp(Src) 99.3 F (37.4 C) (Oral)  Resp 18  Ht 5' 2.5" (1.588 m)  Wt 62.506 kg (137 lb 12.8 oz)  BMI 24.79 kg/m2  SpO2 97%  General Appearance:    Alert, cooperative but occasionally agitated. Disoriented to time and place.   Head:    Normocephalic, without obvious abnormality, atraumatic  Eyes:    PERRL, conjunctiva/corneas clear, EOM's intact, fundi    benign, both eyes     Nose:   Nares  normal, septum midline, mucosa normal, no drainage    or sinus tenderness  Throat:   Lips, mucosa, and tongue normal; teeth and gums normal  Neck:   Supple, symmetrical, trachea midline, no adenopathy;    thyroid:  no enlargement/tenderness/nodules; no carotid   bruit or JVD  Back:     Symmetric, no curvature, ROM normal, no CVA tenderness  Lungs:     Clear to auscultation bilaterally, respirations unlabored  Chest Wall:    No tenderness or deformity   Heart:    Regular rate and rhythm, S1 and S2 normal, no murmur, rub   or gallop     Abdomen:     Soft, non-tender, bowel sounds active all four quadrants,    no masses, no organomegaly  Genitalia:   deferred   Rectal:   deferred   Extremities:   deformities of both hands consistent with  Rheumatoid arthritis. trace pretibial edema.   Pulses:   2+ and symmetric all extremities  Skin:   Skin color, texture, turgor normal, no rashes or lesions  Lymph nodes:   Cervical, supraclavicular, and axillary nodes normal  Neurologic:   CNII-XII intact, left arm and leg possibly slightly weaker than right.            psychiatric: Normal affect.   Labs on Admission:  Basic Metabolic Panel:  Recent Labs Lab 12/10/13 1913  NA 138  K 3.9  CL 97  CO2 27  GLUCOSE 101*  BUN 10  CREATININE 1.34*  CALCIUM 10.1   Liver Function Tests:  Recent Labs Lab 12/10/13 1913  AST 17  ALT 8  ALKPHOS 45  BILITOT 0.6  PROT 8.5*  ALBUMIN 3.5   No results found for this basename: LIPASE, AMYLASE,  in the last 168 hours No results found for this basename: AMMONIA,  in the last 168 hours CBC:  Recent Labs Lab 12/10/13 1913  WBC 10.1  NEUTROABS 6.3  HGB 10.4*  HCT 31.2*  MCV 70.7*  PLT 252   Cardiac Enzymes: No results found for this basename: CKTOTAL, CKMB, CKMBINDEX, TROPONINI,  in the last 168 hours  BNP (last 3 results) No results found for this basename: PROBNP,  in the last 8760 hours CBG: No results found for this basename:  GLUCAP,  in the last 168 hours  Radiological Exams on Admission: Dg Chest 1 View  12/10/2013   CLINICAL DATA:  Altered mental status.  EXAM: CHEST - 1 VIEW  COMPARISON:  PA and lateral chest 10/21/2012 11/13/2013. CT chest 09/11/2009.  FINDINGS: Pulmonary fibrotic change and scattered bronchiectasis, most notable in the bases, are again seen. There is some subsegmental atelectasis in the left lung base. Right lung is clear. Heart size is upper normal.  IMPRESSION: No acute disease.  Stable compared to prior exam.   Electronically  Signed   By: Drusilla Kanner M.D.   On: 12/10/2013 18:35   Ct Head (brain) Wo Contrast  12/10/2013   CLINICAL DATA:  Disorientation.  Recent CVA.  EXAM: CT HEAD WITHOUT CONTRAST  TECHNIQUE: Contiguous axial images were obtained from the base of the skull through the vertex without contrast.  COMPARISON:  None  FINDINGS: Mild atrophy and small vessel disease. Continued resolution of hemorrhagic inferior right caudate lesion, with no new areas of hemorrhage or infarction. Bilateral basal ganglia calcification. No hydrocephalus, mass lesion, or extra-axial fluid. Calvarium intact. Venous lakes. Vascular calcification. No sinus disease.  IMPRESSION: Resolving right caudate hemorrhage. No acute intracranial abnormality. Chronic changes as described.   Electronically Signed   By: Davonna Belling M.D.   On: 12/10/2013 18:49    EKG: Sinus tachycardia Multiple premature complexes, vent & supraven Anteroseptal infarct, old  Assessment/Plan Principal Problem:   Acute encephalopathy secondary to UTI. No evidence of new stroke. It is reasonable to observe overnight to ensure stability of neurologic exam, give IV fluid, continue antibiotic, and likely discharge tomorrow if stable. No need for futher neurologic imaging at this time. Active Problems:   UTI (urinary tract infection): Continue Rocephin   Rheumatoid arthritis(714.0)   Hypertension   History of hemorrhagic stroke with residual  hemiparesis   Dehydration: IV fluids  Code Status: full Family Communication: daughter at bedside Disposition Plan: home  Time spent: 60 minutes  Christiane Ha [facility] Pager [phone]

## 2013-12-11 ENCOUNTER — Other Ambulatory Visit: Payer: Self-pay

## 2013-12-11 ENCOUNTER — Encounter (HOSPITAL_COMMUNITY): Payer: Self-pay | Admitting: Emergency Medicine

## 2013-12-11 ENCOUNTER — Inpatient Hospital Stay (HOSPITAL_COMMUNITY)
Admission: EM | Admit: 2013-12-11 | Discharge: 2013-12-14 | DRG: 309 | Disposition: A | Discharge status: Home Health | Payer: Medicare Other | Attending: Internal Medicine | Admitting: Internal Medicine

## 2013-12-11 DIAGNOSIS — I471 Supraventricular tachycardia, unspecified: Secondary | ICD-10-CM | POA: Diagnosis present

## 2013-12-11 DIAGNOSIS — I10 Essential (primary) hypertension: Secondary | ICD-10-CM

## 2013-12-11 DIAGNOSIS — I129 Hypertensive chronic kidney disease with stage 1 through stage 4 chronic kidney disease, or unspecified chronic kidney disease: Secondary | ICD-10-CM | POA: Diagnosis present

## 2013-12-11 DIAGNOSIS — I619 Nontraumatic intracerebral hemorrhage, unspecified: Secondary | ICD-10-CM

## 2013-12-11 DIAGNOSIS — I69359 Hemiplegia and hemiparesis following cerebral infarction affecting unspecified side: Secondary | ICD-10-CM

## 2013-12-11 DIAGNOSIS — G9340 Encephalopathy, unspecified: Secondary | ICD-10-CM

## 2013-12-11 DIAGNOSIS — M069 Rheumatoid arthritis, unspecified: Secondary | ICD-10-CM | POA: Diagnosis present

## 2013-12-11 DIAGNOSIS — I629 Nontraumatic intracranial hemorrhage, unspecified: Secondary | ICD-10-CM

## 2013-12-11 DIAGNOSIS — R1310 Dysphagia, unspecified: Secondary | ICD-10-CM | POA: Diagnosis present

## 2013-12-11 DIAGNOSIS — I69959 Hemiplegia and hemiparesis following unspecified cerebrovascular disease affecting unspecified side: Secondary | ICD-10-CM

## 2013-12-11 DIAGNOSIS — E876 Hypokalemia: Secondary | ICD-10-CM | POA: Diagnosis not present

## 2013-12-11 DIAGNOSIS — N189 Chronic kidney disease, unspecified: Secondary | ICD-10-CM

## 2013-12-11 DIAGNOSIS — R4189 Other symptoms and signs involving cognitive functions and awareness: Secondary | ICD-10-CM

## 2013-12-11 DIAGNOSIS — Z7982 Long term (current) use of aspirin: Secondary | ICD-10-CM

## 2013-12-11 DIAGNOSIS — R55 Syncope and collapse: Secondary | ICD-10-CM

## 2013-12-11 DIAGNOSIS — IMO0002 Reserved for concepts with insufficient information to code with codable children: Secondary | ICD-10-CM

## 2013-12-11 DIAGNOSIS — Z86711 Personal history of pulmonary embolism: Secondary | ICD-10-CM

## 2013-12-11 DIAGNOSIS — N390 Urinary tract infection, site not specified: Secondary | ICD-10-CM | POA: Diagnosis present

## 2013-12-11 DIAGNOSIS — F0390 Unspecified dementia without behavioral disturbance: Secondary | ICD-10-CM | POA: Diagnosis present

## 2013-12-11 DIAGNOSIS — R4182 Altered mental status, unspecified: Secondary | ICD-10-CM

## 2013-12-11 DIAGNOSIS — Z86718 Personal history of other venous thrombosis and embolism: Secondary | ICD-10-CM

## 2013-12-11 DIAGNOSIS — N182 Chronic kidney disease, stage 2 (mild): Secondary | ICD-10-CM | POA: Diagnosis present

## 2013-12-11 DIAGNOSIS — R509 Fever, unspecified: Secondary | ICD-10-CM

## 2013-12-11 DIAGNOSIS — Z7901 Long term (current) use of anticoagulants: Secondary | ICD-10-CM

## 2013-12-11 DIAGNOSIS — K219 Gastro-esophageal reflux disease without esophagitis: Secondary | ICD-10-CM | POA: Diagnosis present

## 2013-12-11 DIAGNOSIS — Z96659 Presence of unspecified artificial knee joint: Secondary | ICD-10-CM

## 2013-12-11 DIAGNOSIS — E860 Dehydration: Secondary | ICD-10-CM | POA: Diagnosis present

## 2013-12-11 DIAGNOSIS — I2699 Other pulmonary embolism without acute cor pulmonale: Secondary | ICD-10-CM

## 2013-12-11 DIAGNOSIS — F05 Delirium due to known physiological condition: Secondary | ICD-10-CM | POA: Diagnosis present

## 2013-12-11 DIAGNOSIS — I498 Other specified cardiac arrhythmias: Principal | ICD-10-CM | POA: Diagnosis present

## 2013-12-11 DIAGNOSIS — Z79899 Other long term (current) drug therapy: Secondary | ICD-10-CM

## 2013-12-11 LAB — BASIC METABOLIC PANEL
BUN: 11 mg/dL (ref 6–23)
CO2: 23 mEq/L (ref 19–32)
Calcium: 9.9 mg/dL (ref 8.4–10.5)
Chloride: 96 mEq/L (ref 96–112)
Creatinine, Ser: 1.25 mg/dL — ABNORMAL HIGH (ref 0.50–1.10)
GFR calc Af Amer: 42 mL/min — ABNORMAL LOW (ref >90)
GFR calc non Af Amer: 36 mL/min — ABNORMAL LOW (ref >90)
Glucose, Bld: 101 mg/dL — ABNORMAL HIGH (ref 70–99)
Potassium: 3.9 mEq/L (ref 3.7–5.3)
Sodium: 136 mEq/L — ABNORMAL LOW (ref 137–147)

## 2013-12-11 LAB — CBC WITH DIFFERENTIAL/PLATELET
Basophils Absolute: 0 10*3/uL (ref 0.0–0.1)
Basophils Relative: 0 % (ref 0–1)
Eosinophils Absolute: 0.1 10*3/uL (ref 0.0–0.7)
Eosinophils Relative: 1 % (ref 0–5)
HCT: 33.1 % — ABNORMAL LOW (ref 36.0–46.0)
Hemoglobin: 11 g/dL — ABNORMAL LOW (ref 12.0–15.0)
Lymphocytes Relative: 10 % — ABNORMAL LOW (ref 12–46)
Lymphs Abs: 1.1 10*3/uL (ref 0.7–4.0)
MCH: 23.7 pg — ABNORMAL LOW (ref 26.0–34.0)
MCHC: 33.2 g/dL (ref 30.0–36.0)
MCV: 71.2 fL — ABNORMAL LOW (ref 78.0–100.0)
Monocytes Absolute: 0.6 10*3/uL (ref 0.1–1.0)
Monocytes Relative: 5 % (ref 3–12)
Neutro Abs: 9.1 10*3/uL — ABNORMAL HIGH (ref 1.7–7.7)
Neutrophils Relative %: 84 % — ABNORMAL HIGH (ref 43–77)
Platelets: 250 10*3/uL (ref 150–400)
RBC: 4.65 MIL/uL (ref 3.87–5.11)
RDW: 17 % — ABNORMAL HIGH (ref 11.5–15.5)
WBC: 10.8 10*3/uL — ABNORMAL HIGH (ref 4.0–10.5)

## 2013-12-11 LAB — URINALYSIS, ROUTINE W REFLEX MICROSCOPIC
Bilirubin Urine: NEGATIVE
Glucose, UA: NEGATIVE mg/dL
Ketones, ur: NEGATIVE mg/dL
Nitrite: NEGATIVE
Protein, ur: 30 mg/dL — AB
Specific Gravity, Urine: 1.013 (ref 1.005–1.030)
Urobilinogen, UA: 1 mg/dL (ref 0.0–1.0)
pH: 7 (ref 5.0–8.0)

## 2013-12-11 LAB — TSH: TSH: 1.17 u[IU]/mL (ref 0.350–4.500)

## 2013-12-11 LAB — URINE MICROSCOPIC-ADD ON

## 2013-12-11 LAB — TROPONIN I: Troponin I: <0.3 ng/mL (ref <0.30)

## 2013-12-11 LAB — MAGNESIUM: Magnesium: 1.3 mg/dL — ABNORMAL LOW (ref 1.5–2.5)

## 2013-12-11 MED ORDER — CEFUROXIME AXETIL 500 MG PO TABS: 500.0000 mg | ORAL_TABLET | Freq: Two times a day (BID) | ORAL | Status: DC

## 2013-12-11 MED ORDER — SENNOSIDES-DOCUSATE SODIUM 8.6-50 MG PO TABS
1.0000 | ORAL_TABLET | Freq: Two times a day (BID) | ORAL | Status: DC
  Administered 2013-12-11 – 2013-12-14 (×6): 1 via ORAL
  Filled 2013-12-11 (×7): qty 1

## 2013-12-11 MED ORDER — ENOXAPARIN SODIUM 40 MG/0.4ML ~~LOC~~ SOLN
40.0000 mg | SUBCUTANEOUS | Status: DC
  Administered 2013-12-11: 40 mg via SUBCUTANEOUS
  Filled 2013-12-11 (×2): qty 0.4

## 2013-12-11 MED ORDER — PANTOPRAZOLE SODIUM 40 MG PO TBEC
40.0000 mg | DELAYED_RELEASE_TABLET | Freq: Every day | ORAL | Status: DC
  Administered 2013-12-12 – 2013-12-14 (×3): 40 mg via ORAL
  Filled 2013-12-11 (×3): qty 1

## 2013-12-11 MED ORDER — DILTIAZEM HCL 30 MG PO TABS
30.0000 mg | ORAL_TABLET | Freq: Three times a day (TID) | ORAL | Status: DC
  Administered 2013-12-11: 30 mg via ORAL
  Filled 2013-12-11 (×4): qty 1

## 2013-12-11 MED ORDER — ONDANSETRON HCL 4 MG/2ML IJ SOLN
4.0000 mg | Freq: Four times a day (QID) | INTRAMUSCULAR | Status: DC | PRN
  Administered 2013-12-11: 4 mg via INTRAVENOUS
  Filled 2013-12-11: qty 2

## 2013-12-11 MED ORDER — ACETAMINOPHEN 650 MG RE SUPP: 650.0000 mg | Freq: Four times a day (QID) | RECTAL | Status: DC | PRN

## 2013-12-11 MED ORDER — MIRTAZAPINE 15 MG PO TABS
15.0000 mg | ORAL_TABLET | Freq: Every day | ORAL | Status: DC
  Administered 2013-12-11 – 2013-12-13 (×3): 15 mg via ORAL
  Filled 2013-12-11 (×4): qty 1

## 2013-12-11 MED ORDER — PREDNISONE 5 MG PO TABS
5.0000 mg | ORAL_TABLET | Freq: Every morning | ORAL | Status: DC
  Administered 2013-12-12 – 2013-12-14 (×3): 5 mg via ORAL
  Filled 2013-12-11 (×3): qty 1

## 2013-12-11 MED ORDER — ASPIRIN EC 81 MG PO TBEC
81.0000 mg | DELAYED_RELEASE_TABLET | Freq: Every day | ORAL | Status: DC
  Administered 2013-12-12 – 2013-12-14 (×3): 81 mg via ORAL
  Filled 2013-12-11 (×3): qty 1

## 2013-12-11 MED ORDER — CALCIUM CARBONATE-VITAMIN D 500-200 MG-UNIT PO TABS
1.0000 | ORAL_TABLET | Freq: Two times a day (BID) | ORAL | Status: DC
  Administered 2013-12-11 – 2013-12-12 (×3): 1 via ORAL
  Filled 2013-12-11 (×5): qty 1

## 2013-12-11 MED ORDER — SODIUM CHLORIDE 0.9 % IV SOLN
INTRAVENOUS | Status: DC
  Administered 2013-12-11: 18:00:00 via INTRAVENOUS

## 2013-12-11 MED ORDER — MAGNESIUM SULFATE 40 MG/ML IJ SOLN
2.0000 g | Freq: Once | INTRAMUSCULAR | Status: AC
  Administered 2013-12-11: 2 g via INTRAVENOUS
  Filled 2013-12-11: qty 50

## 2013-12-11 MED ORDER — ONDANSETRON HCL 4 MG PO TABS: 4.0000 mg | ORAL_TABLET | Freq: Four times a day (QID) | ORAL | Status: DC | PRN

## 2013-12-11 MED ORDER — DILTIAZEM HCL 30 MG PO TABS
30.0000 mg | ORAL_TABLET | Freq: Three times a day (TID) | ORAL | Status: DC
  Filled 2013-12-11: qty 1

## 2013-12-11 MED ORDER — CEFUROXIME AXETIL 500 MG PO TABS
500.0000 mg | ORAL_TABLET | Freq: Two times a day (BID) | ORAL | Status: DC
  Administered 2013-12-11 – 2013-12-14 (×6): 500 mg via ORAL
  Filled 2013-12-11 (×8): qty 1

## 2013-12-11 MED ORDER — CALCIUM CARBONATE-VITAMIN D 600-400 MG-UNIT PO TABS: 1.0000 | ORAL_TABLET | Freq: Two times a day (BID) | ORAL | Status: DC

## 2013-12-11 MED ORDER — METHYLPHENIDATE HCL 5 MG PO TABS: 5.0000 mg | ORAL_TABLET | Freq: Two times a day (BID) | ORAL | Status: DC

## 2013-12-11 MED ORDER — ACETAMINOPHEN 325 MG PO TABS
650.0000 mg | ORAL_TABLET | Freq: Four times a day (QID) | ORAL | Status: DC | PRN
  Administered 2013-12-13: 650 mg via ORAL
  Filled 2013-12-11 (×2): qty 2

## 2013-12-11 NOTE — ED Notes (Signed)
PA Cruz Condon at bedside.

## 2013-12-11 NOTE — H&P (Addendum)
[facility] History and Physical  Shelly [name]:[id number] DOB: [birth year] DOA: 12/11/2013  Referring physician: Dr. Rhunette Croft PCP: Cain Saupe, MD   Chief Complaint: Brought in by EMS for near syncope and SVT  HPI:  80 year old female with history of hypertension, pulmonary embolism on chronic Coumadin which was discontinued after she sustained a hemorrhagic stroke 1 month back with residual left-sided weakness, chronic kidney disease stage II , GERD, chronic anemia, history of SVT who was admitted yesterday for acute encephalopathy secondary to UTI and wasn't discharged this morning on oral Keflex. EMS took her home and when they were getting her out of the stretcher she was unsteady with her eyes rolled. Patient did not lose consciousness, have witnessed seizure, bowel or urinary incontinence. She was found to have heart rate in one 65-170 beats per minute. EMS brought her back to the ED. In the ED her vitals were stable although was reported to have pulse in the 40s by the nurse telemetry showed sinus tachycardia and low 100s with multiple PVCs. Blood work done showed a WBC of 10.8, hemoglobin of 11, sodium of 136, potassium 3.9, creatinine 1.25 and initial troponin was negative. Magnesium was low at 1.3. EKG showed sinus tachycardia at 108 with PVCs. Hospitalists called for admission on observation. History Limited from the patient did to her underlying dementia and was provided by daughter at bedside. Patient denied any headache, blurred vision, dizziness, nausea, vomiting, chest pain, palpitations, shortness of breath, abdominal pain, bowel or urinary symptoms.  Review of Systems:  As outlined in history of present illness   Past Medical History  Diagnosis Date  . Hypertension   . Bronchitis     "not chronic; not recurrent" (11/13/2013)  . Chronic anemia   . GERD (gastroesophageal reflux disease)   . Pulmonary embolism   . DVT (deep venous thrombosis)     "RLE?"  .  Rheumatoid arthritis   . Stroke     11/13/2013 "she has a slow bleed"  . Chronic kidney disease (CKD), stage II (mild)     Stage II, creatine clearance 40-50 cc /minute   Past Surgical History  Procedure Laterality Date  . Replacement total knee bilateral Bilateral   . Joint replacement    . Tubal ligation    . Carpal tunnel release    . Dilation and curettage of uterus    . Cataract extraction w/ intraocular lens  implant, bilateral Bilateral    Social History:  reports that she has never smoked. She has never used smokeless tobacco. She reports that she does not drink alcohol or use illicit drugs.  Allergies  Allergen Reactions  . Aricept [Donepezil Hcl] Nausea And Vomiting    She becomes violently ill per daughter.    Family History  Problem Relation Age of Onset  . Hypertension Daughter     Prior to Admission medications   Medication Sig Start Date End Date Taking? Authorizing Provider  aspirin EC 81 MG tablet Take 1 tablet (81 mg total) by mouth daily. 11/28/13   Jacquelynn Cree, PA-C  Calcium Carbonate-Vitamin D (CALCIUM 600+D) 600-400 MG-UNIT per tablet Take 1 tablet by mouth 2 (two) times daily.    Historical Provider, MD  cefUROXime (CEFTIN) 500 MG tablet Take 1 tablet (500 mg total) by mouth 2 (two) times daily with a meal. 12/11/13   Leroy Sea, MD  diltiazem (CARDIZEM) 30 MG tablet Take 1 tablet (30 mg total) by mouth 3 (three) times daily. 11/28/13   Rinaldo Cloud  S Love, PA-C  methylphenidate (RITALIN) 5 MG tablet Take 1 tablet (5 mg total) by mouth 2 (two) times daily with breakfast and lunch. To help with attention and activity level. 11/28/13   Evlyn KannerPamela S Love, PA-C  mirtazapine (REMERON) 15 MG tablet Take 15 mg by mouth at bedtime.    Historical Provider, MD  pantoprazole (PROTONIX) 40 MG tablet Take 40 mg by mouth daily after breakfast.     Historical Provider, MD  predniSONE (DELTASONE) 5 MG tablet Take 5 mg by mouth every morning.     Historical Provider, MD   senna-docusate (SENOKOT-S) 8.6-50 MG per tablet Take 1 tablet by mouth 2 (two) times daily. Available over the counter 11/28/13   Jacquelynn CreePamela S Love, PA-C     Physical Exam:  Filed Vitals:   12/11/13 1438 12/11/13 1449 12/11/13 1500 12/11/13 1514  BP: 138/53 148/68 128/73 128/73  Pulse: 46 36 87 49  Resp:   21 16  SpO2:   98% 97%    Constitutional: Vital signs reviewed.  Patient is an elderly female in no acute distress. HEENT: no pallor, no icterus, moist oral mucosa, Cardiovascular: RRR, S1 normal, S2 normal, no MRG Chest: CTAB, no wheezes, rales, or rhonchi Abdominal: Soft. Non-tender, non-distended, bowel sounds are normal,  Ext: warm, no edema Neurological: A&O x2, nonfocal  Labs on Admission:  Basic Metabolic Panel:  Recent Labs Lab 12/10/13 1913 12/11/13 1323  NA 138 136*  K 3.9 3.9  CL 97 96  CO2 27 23  GLUCOSE 101* 101*  BUN 10 11  CREATININE 1.34* 1.25*  CALCIUM 10.1 9.9   Liver Function Tests:  Recent Labs Lab 12/10/13 1913  AST 17  ALT 8  ALKPHOS 45  BILITOT 0.6  PROT 8.5*  ALBUMIN 3.5   No results found for this basename: LIPASE, AMYLASE,  in the last 168 hours No results found for this basename: AMMONIA,  in the last 168 hours CBC:  Recent Labs Lab 12/10/13 1913 12/11/13 1323  WBC 10.1 10.8*  NEUTROABS 6.3 9.1*  HGB 10.4* 11.0*  HCT 31.2* 33.1*  MCV 70.7* 71.2*  PLT 252 250   Cardiac Enzymes:  Recent Labs Lab 12/11/13 1323  TROPONINI <0.30   BNP: No components found with this basename: POCBNP,  CBG: No results found for this basename: GLUCAP,  in the last 168 hours  Radiological Exams on Admission: Dg Chest 1 View  12/10/2013   CLINICAL DATA:  Altered mental status.  EXAM: CHEST - 1 VIEW  COMPARISON:  PA and lateral chest 10/21/2012 11/13/2013. CT chest 09/11/2009.  FINDINGS: Pulmonary fibrotic change and scattered bronchiectasis, most notable in the bases, are again seen. There is some subsegmental atelectasis in the left lung  base. Right lung is clear. Heart size is upper normal.  IMPRESSION: No acute disease.  Stable compared to prior exam.   Electronically Signed   By: Drusilla Kannerhomas  Dalessio M.D.   On: 12/10/2013 18:35   Ct Head (brain) Wo Contrast  12/10/2013   CLINICAL DATA:  Disorientation.  Recent CVA.  EXAM: CT HEAD WITHOUT CONTRAST  TECHNIQUE: Contiguous axial images were obtained from the base of the skull through the vertex without contrast.  COMPARISON:  None  FINDINGS: Mild atrophy and small vessel disease. Continued resolution of hemorrhagic inferior right caudate lesion, with no new areas of hemorrhage or infarction. Bilateral basal ganglia calcification. No hydrocephalus, mass lesion, or extra-axial fluid. Calvarium intact. Venous lakes. Vascular calcification. No sinus disease.  IMPRESSION: Resolving right caudate hemorrhage.  No acute intracranial abnormality. Chronic changes as described.   Electronically Signed   By: Davonna Belling M.D.   On: 12/10/2013 18:49    EKG: sinus tachy at 108 with multiple PVCs, no ST-T changes  Assessment/Plan Principal Problem:  Near syncope Likely due to SVT and generalized weakness with dehydration and UTI. Check Orthostasis. Unlikely CVA. Patient back to baseline as per her daughter. -Monitor on telemetry. orthostatic vitals in the ED negative. Check TSH. Recent echo 1 month back with normal EF and grade 1 diastolic dysfunction. IV hydration with normal saline.    Active Problems:  SVT Patient has chronic SVT which was noted by EMS. In the ED has sinus tachycardia and will 100s with multiple PVCs. Potassium normal. Check magnesium level and TSH. Continue hydration. Continue home dose Cardizem. I did not notice any episodes of bradycardia on the monitor. Cardiology evaluation if patient has persistent SVTs or findings of tachy-brady. -Low magnesium on admission. Ordered 2 gm IV magnesium sulfate. k normal. 2D echo done recently and would not repeat    History of pulmonary  embolism Was on warfarin chronically which was discontinued after recent intracranial hemorrhage    Chronic kidney disease Renal function at baseline. Monitor with IV hydration    UTI (urinary tract infection) On Keflex. Follow culture  hypomagnesemia Replenished. Monitor in am    History of hemorrhagic stroke with residual hemiparesis Stable. Continue aspirin    Dementia  has moderate dementia.       Diet:cardiac  DVT prophylaxis: sq lovenox   Code Status: full code Family Communication: discussed with daughter at bedside Disposition Plan: home once stable  Avett Reineck [facility] Pager [phone]  Total time spent on admission :45 minutes  If 7PM-7AM, please contact night-coverage [email or web address] Password [facility] 12/11/2013, 3:43 PM

## 2013-12-11 NOTE — Progress Notes (Signed)
Patient is discharged from room 4N09 at this time. Alert and in stable condition. IV site d/c'd. Instructions read to patient and daughter and understanding verbalized. Left unit via wheelchair and daughter at side with belongings.

## 2013-12-11 NOTE — Care Management Note (Signed)
    Page 1 of 1   12/11/2013     10:44:44 AM CARE MANAGEMENT NOTE 12/11/2013  Patient:  Shelly [name], Shelly [name]   Account Number:  [id number]  Date Initiated:  12/11/2013  Documentation initiated by:  Lorne Skeens  Subjective/Objective Assessment:   Patient was admitted with stroke symptoms, positive for UTI. Lives at home with family. Active with [facility] for PT/OT/RN prior to admission     Action/Plan:   Will follow for discharge needs pending PT/OT evals and physician orders.   Anticipated DC Date:  12/11/2013   Anticipated DC Plan:  [place]  CM consult      Choice offered to / List presented to:  C-4 Adult Children        HH arranged  HH-1 RN  [place]      [place].   Status of service:  Completed, signed off Medicare Important Message given?   (If response is "NO", the following Medicare IM given date fields will be blank) Date Medicare IM given:   Date Additional Medicare IM given:    Discharge Disposition:  [place]  Per UR Regulation:  Reviewed for med. necessity/level of care/duration of stay  If discussed at [place] of Stay Meetings, dates discussed:    Comments:  12/11/13 [place], MSN, CM- Met with patient and daughter, who verified that they would like to resume previous care with [facility].  Daughter is also requesting a home health aide.  CM spoke with Dr Candiss Norse, who has placed all necessary orders.  Mary with [facility] was notified of resumption orders.  CM provided daughter with a private duty agency list to help with any additional needs at discharge.  RN updated.

## 2013-12-11 NOTE — Discharge Instructions (Signed)
Follow with Primary MD FULP, CAMMIE, MD in 7 days , follow final culture result data  Get CBC, CMP, 2 view Chest X ray checked  by Primary MD next visit.    Activity: As tolerated with Full fall precautions use walker/cane & assistance as needed   Disposition Home     Diet: Heart Healthy with feeding assistance and aspiration precautions if needed.  For Heart failure patients - Check your Weight same time everyday, if you gain over 2 pounds, or you develop in leg swelling, experience more shortness of breath or chest pain, call your Primary MD immediately. Follow Cardiac Low Salt Diet and 1.8 lit/day fluid restriction.   On your next visit with her primary care physician please Get Medicines reviewed and adjusted.  Please request your Prim.MD to go over all Hospital Tests and Procedure/Radiological results at the follow up, please get all Hospital records sent to your Prim MD by signing hospital release before you go home.   If you experience worsening of your admission symptoms, develop shortness of breath, life threatening emergency, suicidal or homicidal thoughts you must seek medical attention immediately by calling 911 or calling your MD immediately  if symptoms less severe.  You Must read complete instructions/literature along with all the possible adverse reactions/side effects for all the Medicines you take and that have been prescribed to you. Take any new Medicines after you have completely understood and accpet all the possible adverse reactions/side effects.   Do not drive and provide baby sitting services if your were admitted for syncope or siezures until you have seen by Primary MD or a Neurologist and advised to do so again.  Do not drive when taking Pain medications.    Do not take more than prescribed Pain, Sleep and Anxiety Medications  Special Instructions: If you have smoked or chewed Tobacco  in the last 2 yrs please stop smoking, stop any regular Alcohol  and or  any Recreational drug use.  Wear Seat belts while driving.   Please note  You were cared for by a hospitalist during your hospital stay. If you have any questions about your discharge medications or the care you received while you were in the hospital after you are discharged, you can call the unit and asked to speak with the hospitalist on call if the hospitalist that took care of you is not available. Once you are discharged, your primary care physician will handle any further medical issues. Please note that NO REFILLS for any discharge medications will be authorized once you are discharged, as it is imperative that you return to your primary care physician (or establish a relationship with a primary care physician if you do not have one) for your aftercare needs so that they can reassess your need for medications and monitor your lab values.

## 2013-12-11 NOTE — ED Notes (Signed)
This RN attempted IV x 2 with no success. Pt tolerated well. IV team contacted. Phlebotomy at bedside drawing labs. MD Nanavati at bedside.

## 2013-12-11 NOTE — ED Notes (Signed)
While conducting orthostatic vital signs, the patients pulse rate was irregular. Family states that her pulse/ HR has always been irregular. NT Keily Lepp counted pulse for a full complete minute twice for accuracy.To verify, the carotid pulse was counted for a complete minute  at a rate of 48. Blood pressures were within normal limits. Family and NT Pennie Rushing advise not to measure orthostatic vital signs in the standing position r/t the pts AMS and stated pain in left knee. RN [place] notified.

## 2013-12-11 NOTE — Discharge Summary (Signed)
Shelly [name], is a 80 y.o. female  DOB 26-Jul-1918  MRN [id number].  Admission date:  12/10/2013  Admitting Physician  Christiane Ha, MD  Discharge Date:  12/11/2013   Primary MD  Cain Saupe, MD  Recommendations for primary care physician for things to follow:    Repeat CBC BMP and a week, follow final culture results next visit.   Admission Diagnosis  Altered mental status [780.97] UTI (lower urinary tract infection) [599.0]   Discharge Diagnosis  Altered mental status [780.97] UTI (lower urinary tract infection) [599.0]  with toxic encephalopathy.  Principal Problem:   Acute encephalopathy Active Problems:   Rheumatoid arthritis(714.0)   Hypertension   UTI (urinary tract infection)   History of hemorrhagic stroke with residual hemiparesis   Dehydration      Past Medical History  Diagnosis Date  . Hypertension   . Bronchitis     "not chronic; not recurrent" (11/13/2013)  . Chronic anemia   . GERD (gastroesophageal reflux disease)   . Pulmonary embolism   . DVT (deep venous thrombosis)     "RLE?"  . Rheumatoid arthritis   . Stroke     11/13/2013 "she has a slow bleed"  . Chronic kidney disease (CKD), stage II (mild)     Stage II, creatine clearance 40-50 cc /minute    Past Surgical History  Procedure Laterality Date  . Replacement total knee bilateral Bilateral   . Joint replacement    . Tubal ligation    . Carpal tunnel release    . Dilation and curettage of uterus    . Cataract extraction w/ intraocular lens  implant, bilateral Bilateral      Discharge Condition: stable   Follow UP  Follow-up Information   Follow up with FULP, CAMMIE, MD. Schedule an appointment as soon as possible for a visit in 1 week. (neurology, neurosurgery as before unchanged)    Specialty:  Family Medicine   Contact  information:   [address] [place] [address] [phone]         Discharge Instructions  and  Discharge Medications      Discharge Instructions   Diet - low sodium heart healthy    Complete by:  As directed      Discharge instructions    Complete by:  As directed   Follow with Primary MD FULP, CAMMIE, MD in 7 days , follow final culture result data  Get CBC, CMP, 2 view Chest X ray checked  by Primary MD next visit.    Activity: As tolerated with Full fall precautions use walker/cane & assistance as needed   Disposition Home     Diet: Heart Healthy with feeding assistance and aspiration precautions if needed.  For Heart failure patients - Check your Weight same time everyday, if you gain over 2 pounds, or you develop in leg swelling, experience more shortness of breath or chest pain, call your Primary MD immediately. Follow Cardiac Low Salt Diet and 1.8 lit/day fluid restriction.  On your next visit with her primary care physician please Get Medicines reviewed and adjusted.  Please request your Prim.MD to go over all Hospital Tests and Procedure/Radiological results at the follow up, please get all Hospital records sent to your Prim MD by signing hospital release before you go home.   If you experience worsening of your admission symptoms, develop shortness of breath, life threatening emergency, suicidal or homicidal thoughts you must seek medical attention immediately by calling 911 or calling your MD immediately  if symptoms less severe.  You Must read complete instructions/literature along with all the possible adverse reactions/side effects for all the Medicines you take and that have been prescribed to you. Take any new Medicines after you have completely understood and accpet all the possible adverse reactions/side effects.   Do not drive and provide baby sitting services if your were admitted for syncope or siezures until you have seen by Primary  MD or a Neurologist and advised to do so again.  Do not drive when taking Pain medications.    Do not take more than prescribed Pain, Sleep and Anxiety Medications  Special Instructions: If you have smoked or chewed Tobacco  in the last 2 yrs please stop smoking, stop any regular Alcohol  and or any Recreational drug use.  Wear Seat belts while driving.   Please note  You were cared for by a hospitalist during your hospital stay. If you have any questions about your discharge medications or the care you received while you were in the hospital after you are discharged, you can call the unit and asked to speak with the hospitalist on call if the hospitalist that took care of you is not available. Once you are discharged, your primary care physician will handle any further medical issues. Please note that NO REFILLS for any discharge medications will be authorized once you are discharged, as it is imperative that you return to your primary care physician (or establish a relationship with a primary care physician if you do not have one) for your aftercare needs so that they can reassess your need for medications and monitor your lab values.  Follow with Primary MD FULP, CAMMIE, MD in 7 days , follow final culture result data  Get CBC, CMP, 2 view Chest X ray checked  by Primary MD next visit.    Activity: As tolerated with Full fall precautions use walker/cane & assistance as needed   Disposition Home     Diet: Heart Healthy with feeding assistance and aspiration precautions if needed.  For Heart failure patients - Check your Weight same time everyday, if you gain over 2 pounds, or you develop in leg swelling, experience more shortness of breath or chest pain, call your Primary MD immediately. Follow Cardiac Low Salt Diet and 1.8 lit/day fluid restriction.   On your next visit with her primary care physician please Get Medicines reviewed and adjusted.  Please request your Prim.MD to go  over all Hospital Tests and Procedure/Radiological results at the follow up, please get all Hospital records sent to your Prim MD by signing hospital release before you go home.   If you experience worsening of your admission symptoms, develop shortness of breath, life threatening emergency, suicidal or homicidal thoughts you must seek medical attention immediately by calling 911 or calling your MD immediately  if symptoms less severe.  You Must read complete instructions/literature along with all the possible adverse reactions/side effects for all the Medicines you take and that have  been prescribed to you. Take any new Medicines after you have completely understood and accpet all the possible adverse reactions/side effects.   Do not drive and provide baby sitting services if your were admitted for syncope or siezures until you have seen by Primary MD or a Neurologist and advised to do so again.  Do not drive when taking Pain medications.    Do not take more than prescribed Pain, Sleep and Anxiety Medications  Special Instructions: If you have smoked or chewed Tobacco  in the last 2 yrs please stop smoking, stop any regular Alcohol  and or any Recreational drug use.  Wear Seat belts while driving.   Please note  You were cared for by a hospitalist during your hospital stay. If you have any questions about your discharge medications or the care you received while you were in the hospital after you are discharged, you can call the unit and asked to speak with the hospitalist on call if the hospitalist that took care of you is not available. Once you are discharged, your primary care physician will handle any further medical issues. Please note that NO REFILLS for any discharge medications will be authorized once you are discharged, as it is imperative that you return to your primary care physician (or establish a relationship with a primary care physician if you do not have one) for your  aftercare needs so that they can reassess your need for medications and monitor your lab values.     Increase activity slowly    Complete by:  As directed             Medication List         aspirin EC 81 MG tablet  Take 1 tablet (81 mg total) by mouth daily.     CALCIUM 600+D 600-400 MG-UNIT per tablet  Generic drug:  Calcium Carbonate-Vitamin D  Take 1 tablet by mouth 2 (two) times daily.     cefUROXime 500 MG tablet  Commonly known as:  CEFTIN  Take 1 tablet (500 mg total) by mouth 2 (two) times daily with a meal.     diltiazem 30 MG tablet  Commonly known as:  CARDIZEM  Take 1 tablet (30 mg total) by mouth 3 (three) times daily.     methylphenidate 5 MG tablet  Commonly known as:  RITALIN  Take 1 tablet (5 mg total) by mouth 2 (two) times daily with breakfast and lunch. To help with attention and activity level.     mirtazapine 15 MG tablet  Commonly known as:  REMERON  Take 15 mg by mouth at bedtime.     pantoprazole 40 MG tablet  Commonly known as:  PROTONIX  Take 40 mg by mouth daily after breakfast.     predniSONE 5 MG tablet  Commonly known as:  DELTASONE  Take 5 mg by mouth every morning.     senna-docusate 8.6-50 MG per tablet  Commonly known as:  Senokot-S  Take 1 tablet by mouth 2 (two) times daily. Available over the counter          Diet and Activity recommendation: See Discharge Instructions above   Consults obtained -    Major procedures and Radiology Reports - PLEASE review detailed and final reports for all details, in brief -      Dg Chest 1 View  12/10/2013   CLINICAL DATA:  Altered mental status.  EXAM: CHEST - 1 VIEW  COMPARISON:  PA and lateral chest 10/21/2012  11/13/2013. CT chest 09/11/2009.  FINDINGS: Pulmonary fibrotic change and scattered bronchiectasis, most notable in the bases, are again seen. There is some subsegmental atelectasis in the left lung base. Right lung is clear. Heart size is upper normal.  IMPRESSION: No acute  disease.  Stable compared to prior exam.   Electronically Signed   By: Drusilla Kannerhomas  Dalessio M.D.   On: 12/10/2013 18:35      Ct Head (brain) Wo Contrast  12/10/2013   CLINICAL DATA:  Disorientation.  Recent CVA.  EXAM: CT HEAD WITHOUT CONTRAST  TECHNIQUE: Contiguous axial images were obtained from the base of the skull through the vertex without contrast.  COMPARISON:  None  FINDINGS: Mild atrophy and small vessel disease. Continued resolution of hemorrhagic inferior right caudate lesion, with no new areas of hemorrhage or infarction. Bilateral basal ganglia calcification. No hydrocephalus, mass lesion, or extra-axial fluid. Calvarium intact. Venous lakes. Vascular calcification. No sinus disease.  IMPRESSION: Resolving right caudate hemorrhage. No acute intracranial abnormality. Chronic changes as described.   Electronically Signed   By: Davonna BellingJohn  Curnes M.D.   On: 12/10/2013 18:49   Micro Results      No results found for this or any previous visit (from the past 240 hour(s)).   History of present illness and  Hospital Course:     Kindly see H&P for history of present illness and admission details, please review complete Labs, Consult reports and Test reports for all details in brief Shelly [name], is a 80 y.o. female, patient with history of   right caudate hemorrhagic bleed causing some left-sided hemiparesis few weeks ago, hypertension, rheumatoid arthritis on steroids, anemia of chronic disease, DVT PE in the past currently not on anticoagulation, GERD was brought in to the hospital after she acted little confused at home.   In the ER her workup was suggestive of toxic encephalopathy arising from UTI along with mild dehydration with acute renal failure. Patient was kept here for 23 HR observation for IV fluids, IV Rocephin which was empiric, she showed excellent response to the treatment and today is back to baseline, her family is that site and eager to take her home, patient currently symptom free  with no new focal deficits, she continues to have 4/5 left-sided strength from her recent stroke. She will be discharged home on her home medications unchanged along with 5 more doses of oral Ceftin. Kindly note her urine culture results are still pending we'll request PCP to kindly follow the results next visit. She is clinically responded well to IV Rocephin.     For her chronic underlying medical issues which include rheumatoid arthritis, GERD, hypertension, anemia of chronic disease she will continue her home medications unchanged      [facility] will be [facility] after discharge.   Today   Subjective:   Shelly [name] today has no headache,no chest abdominal pain,no new weakness tingling or numbness, feels much better wants to go home today.   Objective:   Blood pressure 143/71, pulse 80, temperature 98.6 F (37 C), temperature source Oral, resp. rate 18, height 5' 2.5" (1.588 m), weight 62.506 kg (137 lb 12.8 oz), SpO2 98.00%.  No intake or output data in the 24 hours ending 12/11/13 0855  Exam Awake Alert, Oriented x 3, No new F.N deficits, Left side 4/5, Normal affect [place].AT,PERRAL Supple Neck,No JVD, No cervical lymphadenopathy appriciated.  Symmetrical Chest wall movement, Good air movement bilaterally, CTAB RRR,No Gallops,Rubs or new Murmurs, No Parasternal Heave +ve  B.Sounds, Abd Soft, Non tender, No organomegaly appriciated, No rebound -guarding or rigidity. No Cyanosis, Clubbing or edema, No new Rash or bruise  Data Review   CBC w Diff:  Lab Results  Component Value Date   WBC 10.1 12/10/2013   HGB 10.4* 12/10/2013   HCT 31.2* 12/10/2013   PLT 252 12/10/2013   LYMPHOPCT 24 12/10/2013   MONOPCT 12 12/10/2013   EOSPCT 2 12/10/2013   BASOPCT 0 12/10/2013    CMP:  Lab Results  Component Value Date   NA 138 12/10/2013   K 3.9 12/10/2013   CL 97 12/10/2013   CO2 27 12/10/2013   BUN 10 12/10/2013   CREATININE 1.34* 12/10/2013   PROT 8.5* 12/10/2013   ALBUMIN 3.5 12/10/2013     BILITOT 0.6 12/10/2013   ALKPHOS 45 12/10/2013   AST 17 12/10/2013   ALT 8 12/10/2013  .   Total Time in preparing paper work, data evaluation and todays exam - 35 minutes  Leroy Sea M.D on 12/11/2013 at 8:55 AM  [facility] Office  [phone]   **Disclaimer: This note may have been dictated with voice recognition software. Similar sounding words can inadvertently be transcribed and this note may contain transcription errors which may not have been corrected upon publication of note.**

## 2013-12-11 NOTE — ED Notes (Signed)
Per EMS Patient was d/ced yesterday from [facility] for UTI. Family escorted patient back into house and patient had syncopal episode. Family assisted patient to ground, no injuries noted. Patient did not hit head. Upon EMS arrival patient alert, eyes open but unable to follow simple commands, and not oriented. Initial BP 90/palpated. HR between 70-175bpm. Pt had 2 runs of SVT up to 160-175bpm for a few seconds and return to the 70's. Pt is in a.fib. Pt CBG 140. Recent BP 135/82 with increased BP, HR maintained between 50-70bpm.   Patient baseline "talking". PMH of hemorrhagic stroke with left sided deficits. No new deficits noted, no facial droop.  Pupils equal, round and reactive.

## 2013-12-12 DIAGNOSIS — N189 Chronic kidney disease, unspecified: Secondary | ICD-10-CM

## 2013-12-12 DIAGNOSIS — R55 Syncope and collapse: Secondary | ICD-10-CM

## 2013-12-12 DIAGNOSIS — I471 Supraventricular tachycardia, unspecified: Secondary | ICD-10-CM

## 2013-12-12 DIAGNOSIS — Z8673 Personal history of transient ischemic attack (TIA), and cerebral infarction without residual deficits: Secondary | ICD-10-CM

## 2013-12-12 LAB — BASIC METABOLIC PANEL
BUN: 10 mg/dL (ref 6–23)
CO2: 20 mEq/L (ref 19–32)
Calcium: 9.3 mg/dL (ref 8.4–10.5)
Chloride: 101 mEq/L (ref 96–112)
Creatinine, Ser: 1.23 mg/dL — ABNORMAL HIGH (ref 0.50–1.10)
GFR calc Af Amer: 43 mL/min — ABNORMAL LOW (ref >90)
GFR calc non Af Amer: 37 mL/min — ABNORMAL LOW (ref >90)
Glucose, Bld: 80 mg/dL (ref 70–99)
Potassium: 4.8 mEq/L (ref 3.7–5.3)
Sodium: 136 mEq/L — ABNORMAL LOW (ref 137–147)

## 2013-12-12 LAB — URINE CULTURE
Colony Count: NO GROWTH
Culture: NO GROWTH

## 2013-12-12 LAB — MAGNESIUM: Magnesium: 2 mg/dL (ref 1.5–2.5)

## 2013-12-12 MED ORDER — METOPROLOL TARTRATE 25 MG PO TABS
25.0000 mg | ORAL_TABLET | Freq: Two times a day (BID) | ORAL | Status: DC
  Administered 2013-12-12 – 2013-12-14 (×5): 25 mg via ORAL
  Filled 2013-12-12 (×6): qty 1

## 2013-12-12 MED ORDER — ENOXAPARIN SODIUM 30 MG/0.3ML ~~LOC~~ SOLN
30.0000 mg | SUBCUTANEOUS | Status: DC
  Administered 2013-12-12 – 2013-12-13 (×2): 30 mg via SUBCUTANEOUS
  Filled 2013-12-12 (×3): qty 0.3

## 2013-12-12 MED ORDER — FLEET ENEMA 7-19 GM/118ML RE ENEM
1.0000 | ENEMA | Freq: Every day | RECTAL | Status: DC | PRN
  Filled 2013-12-12: qty 1

## 2013-12-12 MED ORDER — BISACODYL 10 MG RE SUPP
10.0000 mg | Freq: Every day | RECTAL | Status: DC | PRN
  Filled 2013-12-12: qty 1

## 2013-12-12 NOTE — Progress Notes (Addendum)
Chart reviewed.   [facility] PROGRESS NOTE  Shelly [name] DUK:[id number] DOB: 07-25-1918 DOA: 12/11/2013 PCP: Cain Saupe, MD  Assessment/Plan:  Principal Problem:   SVT (supraventricular tachycardia). Continued to have PSVT overnight. Started on cardizem po. Near syncope yesterday. Mag low, which is contributing. Repleted IV and now normal. Also started on ritalin at rehab. Likely contributing as well. Will d/c.  Change to beta blocker. Increase activity.  Monitor and consult cardiology for further recs. Has seen Dr. Eldridge Dace in the past Active Problems: hypomagnesemia   History of pulmonary embolism   Chronic kidney disease   UTI (urinary tract infection) on ceftin   History of hemorrhagic stroke with residual hemiparesis   Dementia with delirium over night.   Near syncope secondary to SVT   Code Status:  full Family Communication:   Disposition Plan:  home  Consultants:  [facility]  Procedures:     Antibiotics:  ceftin  HPI/Subjective: No complaints. Per sitter, impulsive and trying to get OOB overnight  Objective: Filed Vitals:   12/12/13 0648  BP: 139/89  Pulse: 54  Temp: 98.4 F (36.9 C)  Resp: 20    Intake/Output Summary (Last 24 hours) at 12/12/13 0826 Last data filed at 12/12/13 3762  Gross per 24 hour  Intake   1310 ml  Output      0 ml  Net   1310 ml   Filed Weights   12/11/13 1823 12/12/13 0648  Weight: 62.7 kg (138 lb 3.7 oz) 57.5 kg (126 lb 12.2 oz)   Tele: NSR with PSVT, PACs, PVCs  Exam:   General:  Alert, cooperative. Eating breakfast. Comfortable. Disoriented to time and place  Cardiovascular: RRR without MGR  Respiratory: CTA without WRR  Abdomen: S, NT, ND  Ext: No CCE  Basic Metabolic Panel:  Recent Labs Lab 12/10/13 1913 12/11/13 1300 12/11/13 1323 12/12/13 0652  NA 138  --  136* 136*  K 3.9  --  3.9 4.8  CL 97  --  96 101  CO2 27  --  23 20  GLUCOSE 101*  --  101* 80  BUN 10  --  11 10  CREATININE  1.34*  --  1.25* 1.23*  CALCIUM 10.1  --  9.9 9.3  MG  --  1.3*  --  2.0   Liver Function Tests:  Recent Labs Lab 12/10/13 1913  AST 17  ALT 8  ALKPHOS 45  BILITOT 0.6  PROT 8.5*  ALBUMIN 3.5   No results found for this basename: LIPASE, AMYLASE,  in the last 168 hours No results found for this basename: AMMONIA,  in the last 168 hours CBC:  Recent Labs Lab 12/10/13 1913 12/11/13 1323  WBC 10.1 10.8*  NEUTROABS 6.3 9.1*  HGB 10.4* 11.0*  HCT 31.2* 33.1*  MCV 70.7* 71.2*  PLT 252 250   Cardiac Enzymes:  Recent Labs Lab 12/11/13 1323  TROPONINI <0.30   BNP (last 3 results) No results found for this basename: PROBNP,  in the last 8760 hours CBG: No results found for this basename: GLUCAP,  in the last 168 hours  No results found for this or any previous visit (from the past 240 hour(s)).   Studies: Dg Chest 1 View  12/10/2013   CLINICAL DATA:  Altered mental status.  EXAM: CHEST - 1 VIEW  COMPARISON:  PA and lateral chest 10/21/2012 11/13/2013. CT chest 09/11/2009.  FINDINGS: Pulmonary fibrotic change and scattered bronchiectasis, most notable in the bases, are again seen.  There is some subsegmental atelectasis in the left lung base. Right lung is clear. Heart size is upper normal.  IMPRESSION: No acute disease.  Stable compared to prior exam.   Electronically Signed   By: Drusilla Kanner M.D.   On: 12/10/2013 18:35   Ct Head (brain) Wo Contrast  12/10/2013   CLINICAL DATA:  Disorientation.  Recent CVA.  EXAM: CT HEAD WITHOUT CONTRAST  TECHNIQUE: Contiguous axial images were obtained from the base of the skull through the vertex without contrast.  COMPARISON:  None  FINDINGS: Mild atrophy and small vessel disease. Continued resolution of hemorrhagic inferior right caudate lesion, with no new areas of hemorrhage or infarction. Bilateral basal ganglia calcification. No hydrocephalus, mass lesion, or extra-axial fluid. Calvarium intact. Venous lakes. Vascular calcification.  No sinus disease.  IMPRESSION: Resolving right caudate hemorrhage. No acute intracranial abnormality. Chronic changes as described.   Electronically Signed   By: Davonna Belling M.D.   On: 12/10/2013 18:49    Scheduled Meds: . aspirin EC  81 mg Oral Daily  . calcium-vitamin D  1 tablet Oral BID  . cefUROXime  500 mg Oral BID WC  . diltiazem  30 mg Oral TID  . enoxaparin (LOVENOX) injection  40 mg Subcutaneous Q24H  . methylphenidate  5 mg Oral BID WC  . mirtazapine  15 mg Oral QHS  . pantoprazole  40 mg Oral QPC breakfast  . predniSONE  5 mg Oral q morning - 10a  . senna-docusate  1 tablet Oral BID   Continuous Infusions: . sodium chloride 75 mL/hr at 12/11/13 1821    Time spent: 35 minutes  Christiane Ha  [facility] Pager [phone]. If 7PM-7AM, please contact night-coverage at [email or web address], password [facility] 12/12/2013, 8:26 AM  LOS: 1 day

## 2013-12-12 NOTE — Consult Note (Addendum)
Admit date: 12/11/2013 Referring Physician  Dr. Lendell Caprice Primary Physician  Dr. Jillyn Hidden Primary Cardiologist  Dr. Eldridge Dace Reason for Consultation  SVT  HPI: 80 year old female with history of hypertension, pulmonary embolism on chronic Coumadin which was discontinued after she sustained a hemorrhagic stroke 1 month back with residual left-sided weakness, chronic kidney disease stage II , GERD, chronic anemia, history of SVT who was admitted yesterday for acute encephalopathy secondary to UTI and was discharged .  EMS took her home and when they were getting her out of the stretcher she was unsteady with her eyes rolled. Patient did not lose consciousness, witnessed seizure, bowel or urinary incontinence. She was found to have heart rate in one 65-170 beats per minute. EMS brought her back to the ED. In the ED her vitals were stable although was reported to have pulse in the 40s by the nurse, telemetry showed sinus tachycardia and low 100s with multiple PVCs. Blood work done showed a WBC of 10.8, hemoglobin of 11, sodium of 136, potassium 3.9, creatinine 1.25 and initial troponin was negative. Magnesium was low at 1.3. EKG showed sinus tachycardia at 108 with PVCs. Hospitalists called for admission on observation. Overnight she has had several episodes of SVT and was started on PO Cardizem.  Her magnesium is being repleted.  Of note, she was started on Ritalin at rehab and this has been stopped.  Her Cardizem was changed to Lopressor and Cardiology is now asked to consult.    PMH:   Past Medical History  Diagnosis Date  . Hypertension   . Bronchitis     "not chronic; not recurrent" (11/13/2013)  . Chronic anemia   . GERD (gastroesophageal reflux disease)   . Pulmonary embolism   . DVT (deep venous thrombosis)     "RLE?"  . Rheumatoid arthritis   . Stroke     11/13/2013 "she has a slow bleed"  . Chronic kidney disease (CKD), stage II (mild)     Stage II, creatine clearance 40-50 cc /minute     PSH:   Past Surgical History  Procedure Laterality Date  . Replacement total knee bilateral Bilateral   . Joint replacement    . Tubal ligation    . Carpal tunnel release    . Dilation and curettage of uterus    . Cataract extraction w/ intraocular lens  implant, bilateral Bilateral     Allergies:  Aricept Prior to Admit Meds:   Prescriptions prior to admission  Medication Sig Dispense Refill  . aspirin EC 81 MG tablet Take 1 tablet (81 mg total) by mouth daily.      . Calcium Carbonate-Vitamin D (CALCIUM 600+D) 600-400 MG-UNIT per tablet Take 1 tablet by mouth 2 (two) times daily.      . cefUROXime (CEFTIN) 500 MG tablet Take 1 tablet (500 mg total) by mouth 2 (two) times daily with a meal.  5 tablet  0  . diltiazem (CARDIZEM) 30 MG tablet Take 1 tablet (30 mg total) by mouth 3 (three) times daily.  120 tablet  1  . methylphenidate (RITALIN) 5 MG tablet Take 1 tablet (5 mg total) by mouth 2 (two) times daily with breakfast and lunch. To help with attention and activity level.  30 tablet  0  . mirtazapine (REMERON) 15 MG tablet Take 15 mg by mouth at bedtime.      . pantoprazole (PROTONIX) 40 MG tablet Take 40 mg by mouth daily after breakfast.       . predniSONE (DELTASONE)  5 MG tablet Take 5 mg by mouth every morning.       . senna-docusate (SENOKOT-S) 8.6-50 MG per tablet Take 1 tablet by mouth 2 (two) times daily. Available over the counter       Fam HX:    Family History  Problem Relation Age of Onset  . Hypertension Daughter    Social HX:    History   Social History  . Marital Status: Married    Spouse Name: Tasia Catchings    Number of Children: 4  . Years of Education: 12+   Occupational History  .     Social History Main Topics  . Smoking status: Never Smoker   . Smokeless tobacco: Never Used  . Alcohol Use: No  . Drug Use: No  . Sexual Activity: No   Other Topics Concern  . Not on file   Social History Narrative   Patient lives at home with her husband Tasia Catchings).    Patient has four children.   Patient has a high school and trade school education.   Patient is retired.   Patient does drink caffeine- one cup of coffee daily.   Patient is right-handed.     ROS:  All 11 ROS were addressed and are negative except what is stated in the HPI  Physical Exam: Blood pressure 139/89, pulse 54, temperature 98.4 F (36.9 C), temperature source Oral, resp. rate 20, height 5\' 2"  (1.575 m), weight 126 lb 12.2 oz (57.5 kg), SpO2 94.00%.    General: Well developed, well nourished, in no acute distress Head: Eyes PERRLA, No xanthomas.   Normal cephalic and atramatic  Lungs:   Clear bilaterally to auscultation and percussion. Heart:   HRRR S1 S2 Pulses are 2+ & equal.            No carotid bruit. No JVD.  No abdominal bruits. No femoral bruits. Abdomen: Bowel sounds are positive, abdomen soft and non-tender without masses Extremities:   No clubbing, cyanosis or edema.  DP +1 Neuro: Alert and oriented X 3. Psych:  Good affect, responds appropriately    Labs:   Lab Results  Component Value Date   WBC 10.8* 12/11/2013   HGB 11.0* 12/11/2013   HCT 33.1* 12/11/2013   MCV 71.2* 12/11/2013   PLT 250 12/11/2013    Recent Labs Lab 12/10/13 1913  12/12/13 0652  NA 138  < > 136*  K 3.9  < > 4.8  CL 97  < > 101  CO2 27  < > 20  BUN 10  < > 10  CREATININE 1.34*  < > 1.23*  CALCIUM 10.1  < > 9.3  PROT 8.5*  --   --   BILITOT 0.6  --   --   ALKPHOS 45  --   --   ALT 8  --   --   AST 17  --   --   GLUCOSE 101*  < > 80  < > = values in this interval not displayed. No results found for this basename: PTT   Lab Results  Component Value Date   INR 1.20 12/10/2013   INR 1.22 11/15/2013   INR 1.11 11/14/2013   Lab Results  Component Value Date   CKTOTAL 31 03/03/2010   CKMB 0.8 03/03/2010   TROPONINI <0.30 12/11/2013     Lab Results  Component Value Date   CHOL  Value: 155        ATP III CLASSIFICATION:  <200  mg/dL   Desirable  852-778  mg/dL   Borderline High   >=242    mg/dL   High        3/53/6144   Lab Results  Component Value Date   HDL 60 03/04/2010   Lab Results  Component Value Date   LDLCALC  Value: 88        Total Cholesterol/HDL:CHD Risk Coronary Heart Disease Risk Table                     Men   Women  1/2 Average Risk   3.4   3.3  Average Risk       5.0   4.4  2 X Average Risk   9.6   7.1  3 X Average Risk  23.4   11.0        Use the calculated Patient Ratio above and the CHD Risk Table to determine the patient's CHD Risk.        ATP III CLASSIFICATION (LDL):  <100     mg/dL   Optimal  315-400  mg/dL   Near or Above                    Optimal  130-159  mg/dL   Borderline  867-619  mg/dL   High  >509     mg/dL   Very High 09/29/7122   Lab Results  Component Value Date   TRIG 36 03/04/2010   Lab Results  Component Value Date   CHOLHDL 2.6 03/04/2010   No results found for this basename: LDLDIRECT      Radiology:  Dg Chest 1 View  12/10/2013   CLINICAL DATA:  Altered mental status.  EXAM: CHEST - 1 VIEW  COMPARISON:  PA and lateral chest 10/21/2012 11/13/2013. CT chest 09/11/2009.  FINDINGS: Pulmonary fibrotic change and scattered bronchiectasis, most notable in the bases, are again seen. There is some subsegmental atelectasis in the left lung base. Right lung is clear. Heart size is upper normal.  IMPRESSION: No acute disease.  Stable compared to prior exam.   Electronically Signed   By: Drusilla Kanner M.D.   On: 12/10/2013 18:35   Ct Head (brain) Wo Contrast  12/10/2013   CLINICAL DATA:  Disorientation.  Recent CVA.  EXAM: CT HEAD WITHOUT CONTRAST  TECHNIQUE: Contiguous axial images were obtained from the base of the skull through the vertex without contrast.  COMPARISON:  None  FINDINGS: Mild atrophy and small vessel disease. Continued resolution of hemorrhagic inferior right caudate lesion, with no new areas of hemorrhage or infarction. Bilateral basal ganglia calcification. No hydrocephalus, mass lesion, or extra-axial fluid. Calvarium  intact. Venous lakes. Vascular calcification. No sinus disease.  IMPRESSION: Resolving right caudate hemorrhage. No acute intracranial abnormality. Chronic changes as described.   Electronically Signed   By: Davonna Belling M.D.   On: 12/10/2013 18:49    EKG:  NSR with frequent PAC's with aberration  ASSESSMENT:  1.  PSVT x several episodes over the past 24 hours in the setting of low mag.  Telemetry reviewed. Her episodes of SVT have improved.  This is most likely paroxysmal atrial tachycardia since she is having a lot of PAC's some with aberration.   2.  Presyncope secondary to #1 - 2D echo a year ago with normal LVF 3.  UTI  PLAN:   1.  Agree with changing Cardizem to Lopressor - can use IV lopressor PRN for acute episodes.  Increase Lopressor  dose as needed for SVT breakthrough as BP allows 2.  Replete magnesium 3.  Recheck 2D echo to reassess LVF  Quintella Reichert, MD  12/12/2013  10:33 AM

## 2013-12-12 NOTE — Progress Notes (Signed)
Pharmacy: Lovenox for VTE prophylaxis  Patient's a 80 y.o F on lovenox 40mg  daily for VTE prophylaxis.  Scr 1.23 (crcl<30).  Will adjust lovenox dose to 30 mg SQ daily for renal function.  , PharmD, BCPS

## 2013-12-13 DIAGNOSIS — R1310 Dysphagia, unspecified: Secondary | ICD-10-CM

## 2013-12-13 DIAGNOSIS — I498 Other specified cardiac arrhythmias: Principal | ICD-10-CM

## 2013-12-13 DIAGNOSIS — I10 Essential (primary) hypertension: Secondary | ICD-10-CM

## 2013-12-13 LAB — BASIC METABOLIC PANEL
BUN: 8 mg/dL (ref 6–23)
CO2: 24 mEq/L (ref 19–32)
Calcium: 9.3 mg/dL (ref 8.4–10.5)
Chloride: 98 mEq/L (ref 96–112)
Creatinine, Ser: 1.12 mg/dL — ABNORMAL HIGH (ref 0.50–1.10)
GFR calc Af Amer: 48 mL/min — ABNORMAL LOW (ref >90)
GFR calc non Af Amer: 41 mL/min — ABNORMAL LOW (ref >90)
Glucose, Bld: 91 mg/dL (ref 70–99)
Potassium: 3.6 mEq/L — ABNORMAL LOW (ref 3.7–5.3)
Sodium: 137 mEq/L (ref 137–147)

## 2013-12-13 LAB — MAGNESIUM: Magnesium: 1.6 mg/dL (ref 1.5–2.5)

## 2013-12-13 LAB — MRSA PCR SCREENING: MRSA by PCR: NEGATIVE

## 2013-12-13 NOTE — Progress Notes (Addendum)
[facility] PROGRESS NOTE  Shelly [name] UYQ:[id number] DOB: Jun 18, 1919 DOA: 12/11/2013 PCP: Cain Saupe, MD  Assessment/Plan:  Principal Problem:   SVT (supraventricular tachycardia):  Has had only PVCs and PACs overnight from what I can tell. Recheck labs. would not resume Ritalin.Will increase activity and monitor for another 24-48 hours. Appreciate cardiology's assistance. PT eval pending. Active Problems: Reported swallowing difficulty: Nurse and tech report more difficulty with thin liquids and aches. Will order thicken and reconsult speech for swallow evaluation. Hypomagnesemia, recheck   History of pulmonary embolism   Chronic kidney disease   UTI (urinary tract infection) on ceftin, culture negative   History of hemorrhagic stroke with residual hemiparesis   Dementia with delirium over night.   Near syncope secondary to SVT  Code Status:  full Family Communication:  daughter Disposition Plan:  home  Consultants:  [facility]  Procedures:     Antibiotics:  ceftin  HPI/Subjective: "much better"  Objective: Filed Vitals:   12/13/13 0540  BP: 114/73  Pulse: 83  Temp: 98.4 F (36.9 C)  Resp: 18    Intake/Output Summary (Last 24 hours) at 12/13/13 0807 Last data filed at 12/13/13 0100  Gross per 24 hour  Intake    960 ml  Output      0 ml  Net    960 ml   Filed Weights   12/11/13 1823 12/12/13 0648 12/13/13 0540  Weight: 62.7 kg (138 lb 3.7 oz) 57.5 kg (126 lb 12.2 oz) 62.8 kg (138 lb 7.2 oz)   Tele: NSR with PACs, PVCs  Exam:   General:  Alert, cooperative. Eating breakfast. Comfortable. Disoriented to time and place  Cardiovascular: RRR without MGR  Respiratory: CTA without WRR  Abdomen: S, NT, ND  Ext: No CCE  Basic Metabolic Panel:  Recent Labs Lab 12/10/13 1913 12/11/13 1300 12/11/13 1323 12/12/13 0652  NA 138  --  136* 136*  K 3.9  --  3.9 4.8  CL 97  --  96 101  CO2 27  --  23 20  GLUCOSE 101*  --  101* 80  BUN 10   --  11 10  CREATININE 1.34*  --  1.25* 1.23*  CALCIUM 10.1  --  9.9 9.3  MG  --  1.3*  --  2.0   Liver Function Tests:  Recent Labs Lab 12/10/13 1913  AST 17  ALT 8  ALKPHOS 45  BILITOT 0.6  PROT 8.5*  ALBUMIN 3.5   No results found for this basename: LIPASE, AMYLASE,  in the last 168 hours No results found for this basename: AMMONIA,  in the last 168 hours CBC:  Recent Labs Lab 12/10/13 1913 12/11/13 1323  WBC 10.1 10.8*  NEUTROABS 6.3 9.1*  HGB 10.4* 11.0*  HCT 31.2* 33.1*  MCV 70.7* 71.2*  PLT 252 250   Cardiac Enzymes:  Recent Labs Lab 12/11/13 1323  TROPONINI <0.30   BNP (last 3 results) No results found for this basename: PROBNP,  in the last 8760 hours CBG: No results found for this basename: GLUCAP,  in the last 168 hours  Recent Results (from the past 240 hour(s))  URINE CULTURE     Status: None   Collection Time    12/11/13  1:49 PM      Result Value Ref Range Status   Specimen Description URINE, CATHETERIZED   Final   Special Requests NONE   Final   Culture  Setup Time     Final  Value: 12/11/2013 19:22     Performed at [facility] Count     Final   Value: NO GROWTH     Performed at [facility]   Culture     Final   Value: NO GROWTH     Performed at [facility]   Report Status 12/12/2013 FINAL   Final     Studies: No results found.  Scheduled Meds: . aspirin EC  81 mg Oral Daily  . calcium-vitamin D  1 tablet Oral BID  . cefUROXime  500 mg Oral BID WC  . enoxaparin (LOVENOX) injection  30 mg Subcutaneous Q24H  . metoprolol tartrate  25 mg Oral BID  . mirtazapine  15 mg Oral QHS  . pantoprazole  40 mg Oral QPC breakfast  . predniSONE  5 mg Oral q morning - 10a  . senna-docusate  1 tablet Oral BID   Continuous Infusions:    Time spent: 35 minutes  Aslan Himes L  [facility] Pager [phone]. If 7PM-7AM, please contact night-coverage at [email or web address], password [facility] 12/13/2013,  8:07 AM  LOS: 2 days

## 2013-12-13 NOTE — Progress Notes (Signed)
       Patient Name: Shelly [name] Date of Encounter: 12/13/2013    SUBJECTIVE: Doing well this AM. No recurrence SVT.  TELEMETRY:  NSR with PAC's: Filed Vitals:   12/12/13 1700 12/12/13 2032 12/13/13 0540 12/13/13 0853  BP: 102/87 117/82 114/73 139/71  Pulse: 83 76 83 63  Temp: 97.8 F (36.6 C) 99.4 F (37.4 C) 98.4 F (36.9 C)   TempSrc: Oral Oral Oral   Resp: 20 18 18    Height:      Weight:   138 lb 7.2 oz (62.8 kg)   SpO2: 99% 98% 96%     Intake/Output Summary (Last 24 hours) at 12/13/13 0922 Last data filed at 12/13/13 0100  Gross per 24 hour  Intake    960 ml  Output      0 ml  Net    960 ml   LABS: Basic Metabolic Panel:  Recent Labs  02/12/14 1300 12/11/13 1323 12/12/13 0652  NA  --  136* 136*  K  --  3.9 4.8  CL  --  96 101  CO2  --  23 20  GLUCOSE  --  101* 80  BUN  --  11 10  CREATININE  --  1.25* 1.23*  CALCIUM  --  9.9 9.3  MG 1.3*  --  2.0   CBC:  Recent Labs  12/10/13 1913 12/11/13 1323  WBC 10.1 10.8*  NEUTROABS 6.3 9.1*  HGB 10.4* 11.0*  HCT 31.2* 33.1*  MCV 70.7* 71.2*  PLT 252 250   Cardiac Enzymes:  Recent Labs  12/11/13 1323  TROPONINI <0.30     Radiology/Studies:  No new data  Physical Exam: Blood pressure 139/71, pulse 63, temperature 98.4 F (36.9 C), temperature source Oral, resp. rate 18, height 5\' 2"  (1.575 m), weight 138 lb 7.2 oz (62.8 kg), SpO2 96.00%. Weight change: 3.5 oz (0.1 kg)  Wt Readings from Last 3 Encounters:  12/13/13 138 lb 7.2 oz (62.8 kg)  12/10/13 137 lb 12.8 oz (62.506 kg)  11/16/13 152 lb 5.4 oz (69.1 kg)    Chest clear No rub or gallop.  ASSESSMENT:  1. PSVT, better controlled with beta blocker.   Plan:  Same. Call if we can help further.  02/09/14 12/13/2013, 9:22 AM

## 2013-12-13 NOTE — Evaluation (Signed)
Clinical/Bedside Swallow Evaluation Patient Details  Name: Shelly [name] MRN: [id number] Date of Birth: 13-Feb-1919  Today's Date: 12/13/2013 Time: 1000-1035 SLP Time Calculation (min): 35 min  Past Medical History:  Past Medical History  Diagnosis Date  . Hypertension   . Bronchitis     "not chronic; not recurrent" (11/13/2013)  . Chronic anemia   . GERD (gastroesophageal reflux disease)   . Pulmonary embolism   . DVT (deep venous thrombosis)     "RLE?"  . Rheumatoid arthritis   . Stroke     11/13/2013 "she has a slow bleed"  . Chronic kidney disease (CKD), stage II (mild)     Stage II, creatine clearance 40-50 cc /minute   Past Surgical History:  Past Surgical History  Procedure Laterality Date  . Replacement total knee bilateral Bilateral   . Joint replacement    . Tubal ligation    . Carpal tunnel release    . Dilation and curettage of uterus    . Cataract extraction w/ intraocular lens  implant, bilateral Bilateral    HPI:  80 year old female with history of hypertension, pulmonary embolism on chronic Coumadin which was discontinued after she sustained a hemorrhagic stroke 1 month back with residual left-sided weakness, chronic kidney disease stage II , GERD, chronic anemia, history of SVT who was admitted 12/10/13 for acute encephalopathy secondary to UTI and was discharged the morning of 12/11/13 on oral Keflex. EMS took her home and when they were getting her out of the stretcher she was unsteady with her eyes rolled. Patient did not lose consciousness, have witnessed seizure, bowel or urinary incontinence. She was found to have heart rate in one 65-170 beats per minute. EMS brought her back to the ED. In the ED her vitals were stable although was reported to have pulse in the 40s by the nurse telemetry showed sinus tachycardia and low 100s with multiple PVCs. Blood work done showed a WBC of 10.8, hemoglobin of 11, sodium of 136, potassium 3.9, creatinine 1.25 and initial troponin  was negative. Magnesium was low at 1.3. EKG showed sinus tachycardia at 108 with PVCs. Nursing reported difficulty with breakfast tray this morning of regular textures and thin liquids, therefore, a BSE was requested.    Assessment / Plan / Recommendation Clinical Impression  Pt seen today for BSE due to nursing reports of difficulty with breakfast meal of regular textures and thin liquids. SLP facilitated session by providing trails of ice chips, thin liquids via cup and straw, Dys. 1 textures and Dys. 2 textures. Pt required Max multimodal cues to self-monitor sip size of thin liquids via straw and demonstrated immediate throat clearing. Suspect throat clear was strong enough to remove suspected penetrates. Pt without overt s/s of aspiration with thin liquids via cup. Pt also demonstrated prolonged mastication and AP transit with Dys.  1 & 2 textures and required verbal cues to initiate swallow due to distractibility. Pt also demonstrated moderate oral residue with Dys. 2 textures that required multiple dry swallows to clear.  Suspect difficulty with meal this morning was due to mixed consistencies given amount of oral residue with Dys. 2 textures, therefore, recommend pt's diet downgrade to Dys. 1 textures with thin liquids via cup with full supervision.  RN made aware. SLP will f/u for diet tolerance.     Aspiration Risk  Moderate    Diet Recommendation Thin liquid;Dysphagia 1 (Puree)   Liquid Administration via: Cup;No straw Medication Administration: Crushed with puree Supervision: Full supervision/cueing for compensatory  strategies;Staff to assist with self feeding Compensations: Slow rate;Small sips/bites Postural Changes and/or Swallow Maneuvers: Seated upright 90 degrees;Upright 30-60 min after meal    Other  Recommendations Oral Care Recommendations: Oral care BID;Staff/trained caregiver to provide oral care   Follow Up Recommendations  Home health SLP;24 hour supervision/assistance     Frequency and Duration min 1 x/week  1 week   Pertinent Vitals/Pain N/A      Swallow Study    General Date of Onset: 12/13/13 HPI: 80 year old female with history of hypertension, pulmonary embolism on chronic Coumadin which was discontinued after she sustained a hemorrhagic stroke 1 month back with residual left-sided weakness, chronic kidney disease stage II , GERD, chronic anemia, history of SVT who was admitted 12/10/13 for acute encephalopathy secondary to UTI and was discharged the morning of 12/11/13 on oral Keflex. EMS took her home and when they were getting her out of the stretcher she was unsteady with her eyes rolled. Patient did not lose consciousness, have witnessed seizure, bowel or urinary incontinence. She was found to have heart rate in one 65-170 beats per minute. EMS brought her back to the ED. In the ED her vitals were stable although was reported to have pulse in the 40s by the nurse telemetry showed sinus tachycardia and low 100s with multiple PVCs. Blood work done showed a WBC of 10.8, hemoglobin of 11, sodium of 136, potassium 3.9, creatinine 1.25 and initial troponin was negative. Magnesium was low at 1.3. EKG showed sinus tachycardia at 108 with PVCs. Nursing reported difficulty with breakfast tray this morning of regular textures and thin liquids, therefore, a BSE was requested.  Type of Study: Bedside swallow evaluation Previous Swallow Assessment: Recent admission to CIR, D/C home on Dys. 3 textures with thin liquids  Diet Prior to this Study: Regular;Thin liquids Temperature Spikes Noted: No Respiratory Status: Room air History of Recent Intubation: No Behavior/Cognition: Alert;Cooperative;Pleasant mood;Confused;Distractible;Requires cueing Oral Cavity - Dentition: Dentures, top;Dentures, bottom Self-Feeding Abilities: Able to feed self;Needs assist Patient Positioning: Upright in chair Baseline Vocal Quality: Clear Volitional Cough: Strong Volitional Swallow:  Able to elicit    Oral/Motor/Sensory Function Overall Oral Motor/Sensory Function: Appears within functional limits for tasks assessed   Ice Chips Ice chips: Impaired Presentation: Self Fed;Spoon Oral Phase Functional Implications: Prolonged oral transit Pharyngeal Phase Impairments: Suspected delayed Swallow   Thin Liquid Thin Liquid: Impaired Presentation: Straw;Cup Pharyngeal  Phase Impairments: Suspected delayed Swallow;Throat Clearing - Immediate Other Comments: immediate throat clearing with large,sequential sips of thin liquids via straw    Nectar Thick Nectar Thick Liquid: Not tested   Honey Thick Honey Thick Liquid: Not tested   Puree Puree: Impaired Presentation: Self Fed;Spoon Oral Phase Functional Implications: Prolonged oral transit Pharyngeal Phase Impairments: Suspected delayed Swallow Other Comments: Pt distracted throughout trials and required verbal cues to initiate a swallow    Solid   GO    Solid: Impaired Presentation: Self Fed Oral Phase Impairments: Impaired anterior to posterior transit;Impaired mastication Oral Phase Functional Implications: Oral residue Other Comments: Pt with moderate oral residue with trials of Dys. 2 textures that required moderate cueing to clear       Bogdan Vivona 12/13/2013,10:56 AM

## 2013-12-13 NOTE — Evaluation (Signed)
Physical Therapy Evaluation Patient Details Name: Shelly [name] MRN: [id number] DOB: 11/22/1918 Today's Date: 12/13/2013   History of Present Illness  Pt admit with SVT, presyncope and UTI.    Clinical Impression  Pt admitted with above. Pt currently with functional limitations due to the deficits listed below (see PT Problem List).  Pt will benefit from skilled PT to increase their independence and safety with mobility to allow discharge to the venue listed below.     Follow Up Recommendations Home health PT;Supervision/Assistance - 24 hour (SNF unless pt has 24 hour care)    Equipment Recommendations  3in1 (PT)    Recommendations for Other Services       Precautions / Restrictions Precautions Precautions: Fall Precaution Comments: RA; generalized weakness, history of dementia Restrictions Weight Bearing Restrictions: No      Mobility  Bed Mobility Overal bed mobility: Needs Assistance Bed Mobility: Supine to Sit     Supine to sit: Mod assist     General bed mobility comments: Pt needed assist for elevation of trunk and for LEs to EOB.    Transfers Overall transfer level: Needs assistance Equipment used: 2 person hand held assist Transfers: Sit to/from [employer] Sit to Stand: Mod assist;+2 physical assistance Stand pivot transfers: Mod assist;+2 physical assistance       General transfer comment: Pt cannot stand without bil UE support.  Significant posterior lean.  Stood and pivoted to recliner and then to 3N1 and back to chair.   Ambulation/Gait                Stairs            Wheelchair Mobility    Modified Rankin (Stroke Patients Only)       Balance Overall balance assessment: Needs assistance;History of Falls Sitting-balance support: Bilateral upper extremity supported;Feet supported Sitting balance-Leahy Scale: Poor Sitting balance - Comments: posterior lean - cannot sit up by herself.   Postural control: Posterior  lean Standing balance support: Bilateral upper extremity supported;During functional activity Standing balance-Leahy Scale: Poor Standing balance comment: Pt stood needing bil UE support with profound posterior lean.                               Pertinent Vitals/Pain VSS, no pain    Home Living Family/patient expects to be discharged to:: Private residence Living Arrangements: Children Available Help at Discharge: Family;Available 24 hours/day Type of Home: House Home Access: Stairs to enter Entrance Stairs-Rails: Right Entrance Stairs-Number of Steps: 2 Home Layout: One level Home Equipment: Walker - 2 wheels Additional Comments: Pt lives with daughter and pt's spouse, who are both home all day    Prior Function Level of Independence: Needs assistance   Gait / Transfers Assistance Needed: Amb household distances with walker.  ADL's / Homemaking Assistance Needed: Min A with ADLs  Comments: Enjoys talking on the phone and going to church     Hand Dominance   Dominant Hand: Right    Extremity/Trunk Assessment   Upper Extremity Assessment: Defer to OT evaluation           Lower Extremity Assessment: Generalized weakness         Communication   Communication: No difficulties  Cognition Arousal/Alertness: Awake/alert Behavior During Therapy: Flat affect Overall Cognitive Status: History of cognitive impairments - at baseline  General Comments      Exercises General Exercises - Lower Extremity Ankle Circles/Pumps: AROM;Both;10 reps;Seated Long Arc Quad: AROM;Both;10 reps;Seated      Assessment/Plan    PT Assessment Patient needs continued PT services  PT Diagnosis Generalized weakness   PT Problem List Decreased strength;Decreased activity tolerance;Decreased balance;Decreased mobility;Decreased knowledge of use of DME;Decreased safety awareness;Decreased knowledge of precautions  PT Treatment Interventions DME  instruction;Gait training;Functional mobility training;Therapeutic activities;Therapeutic exercise;Balance training;Patient/family education   PT Goals (Current goals can be found in the Care Plan section) Acute Rehab PT Goals Patient Stated Goal: to go home PT Goal Formulation: With patient Time For Goal Achievement: 12/20/13 Potential to Achieve Goals: Good    Frequency Min 3X/week   Barriers to discharge        Co-evaluation               End of Session Equipment Utilized During Treatment: Gait belt Activity Tolerance: Patient limited by fatigue Patient left: in chair;with call bell/phone within reach;with family/visitor present;with chair alarm set Nurse Communication: Mobility status         Time: 1110-1130 PT Time Calculation (min): 20 min   Charges:   PT Evaluation $Initial PT Evaluation Tier I: 1 Procedure PT Treatments $Therapeutic Activity: 8-22 mins   PT G Codes:          INGOLD,Alantra Popoca 12/20/2013, 12:39 PM [facility] Acute Rehabilitation [phone] (pager)

## 2013-12-14 DIAGNOSIS — N390 Urinary tract infection, site not specified: Secondary | ICD-10-CM

## 2013-12-14 MED ORDER — MAGNESIUM OXIDE 400 (241.3 MG) MG PO TABS
400.0000 mg | ORAL_TABLET | Freq: Every day | ORAL | Status: DC
  Administered 2013-12-14: 400 mg via ORAL
  Filled 2013-12-14: qty 1

## 2013-12-14 MED ORDER — CEFUROXIME AXETIL 500 MG PO TABS: 500.0000 mg | ORAL_TABLET | Freq: Once | ORAL | Status: DC

## 2013-12-14 MED ORDER — POTASSIUM CHLORIDE 10 MEQ/100ML IV SOLN
10.0000 meq | INTRAVENOUS | Status: AC
  Administered 2013-12-14 (×3): 10 meq via INTRAVENOUS
  Filled 2013-12-14 (×3): qty 100

## 2013-12-14 MED ORDER — METOPROLOL TARTRATE 25 MG PO TABS: 25.0000 mg | ORAL_TABLET | Freq: Two times a day (BID) | ORAL | Status: DC

## 2013-12-14 MED ORDER — MAGNESIUM OXIDE 400 (241.3 MG) MG PO TABS: 400.0000 mg | ORAL_TABLET | Freq: Every day | ORAL | Status: DC

## 2013-12-14 NOTE — Progress Notes (Signed)
DC IV and tele per MD orders and protocol; DC instructions reviewed and signed by daughter; no further questions from daughter.  Park Breed, RN

## 2013-12-14 NOTE — Progress Notes (Signed)
Patient Name: Shelly [name] Date of Encounter: 12/14/2013  Principal Problem:   SVT (supraventricular tachycardia) Active Problems:   History of pulmonary embolism   Chronic kidney disease   UTI (urinary tract infection)   History of hemorrhagic stroke with residual hemiparesis   Dementia   Near syncope   Swallowing difficulty    Patient Profile: 80 year old female with hx HTN, PE, coum d/c'd after hemorrhagic CVA, CKD, GERD, chronic anemia, history of SVT, admitted for UTI and discharged 06/09 but had SVT as EMS was taking her home and brought back to the hospital.  SUBJECTIVE: Pt weak and a little confused. Only complaint is being cold. Denies palpitations, SOB or chest pain.  OBJECTIVE Filed Vitals:   12/13/13 1350 12/13/13 2035 12/13/13 2100 12/14/13 0729  BP: 117/51 126/53  151/53  Pulse: 63 66 71 125  Temp: 98.2 F (36.8 C) 98.7 F (37.1 C)  98.3 F (36.8 C)  TempSrc: Tympanic Oral  Oral  Resp: 18 18  20   Height:      Weight:    137 lb 5.6 oz (62.3 kg)  SpO2: 96% 99%      Intake/Output Summary (Last 24 hours) at 12/14/13 0840 Last data filed at 12/13/13 1700  Gross per 24 hour  Intake    150 ml  Output      0 ml  Net    150 ml   Filed Weights   12/12/13 0648 12/13/13 0540 12/14/13 0729  Weight: 126 lb 12.2 oz (57.5 kg) 138 lb 7.2 oz (62.8 kg) 137 lb 5.6 oz (62.3 kg)    PHYSICAL EXAM General: Well developed, well nourished, female in no acute distress. Head: Normocephalic, atraumatic.  Neck: Supple without bruits, JVD not elevated Lungs:  Resp regular and unlabored, CTA. Heart: RRR, S1, S2, no S3, S4, or murmur; no rub. Abdomen: Soft, non-tender, non-distended, BS + x 4.  Extremities: No clubbing, cyanosis, no edema.  Neuro: Alert and oriented X 3. Moves all extremities spontaneously. Psych: Normal affect.  LABS: CBC: Recent Labs  12/11/13 1323  WBC 10.8*  NEUTROABS 9.1*  HGB 11.0*  HCT 33.1*  MCV 71.2*  PLT 250   Basic Metabolic  Panel: Recent Labs  02/10/14 0652 12/13/13 1112  NA 136* 137  K 4.8 3.6*  CL 101 98  CO2 20 24  GLUCOSE 80 91  BUN 10 8  CREATININE 1.23* 1.12*  CALCIUM 9.3 9.3  MG 2.0 1.6   Cardiac Enzymes: Recent Labs  12/11/13 1323  TROPONINI <0.30   BNP: Pro B Natriuretic peptide (BNP)  Date/Time Value Ref Range Status  09/21/2009  2:50 PM 178.0* 0.0 - 100.0 pg/mL Final   Thyroid Function Tests: Recent Labs  12/11/13 1740  TSH 1.170   TELE:  SR, ST, occ PVCs, no SVT  Current Medications:  . aspirin EC  81 mg Oral Daily  . cefUROXime  500 mg Oral BID WC  . enoxaparin (LOVENOX) injection  30 mg Subcutaneous Q24H  . metoprolol tartrate  25 mg Oral BID  . mirtazapine  15 mg Oral QHS  . pantoprazole  40 mg Oral QPC breakfast  . potassium chloride  10 mEq Intravenous Q1 Hr x 3  . predniSONE  5 mg Oral q morning - 10a  . senna-docusate  1 tablet Oral BID      ASSESSMENT AND PLAN: Principal Problem:   SVT (supraventricular tachycardia) - None on Lopressor 25 mg BID now that electrolytes have improved, BP up  and down so would not increase dose at this time. MD advise if cards continue to follow or see PRN.    Hypomagnesemia - Had 2 Gm IV on admission, will add oral supp daily.     Hypokalemia - getting IV K+ per IM, consider adding daily PO Rx.  Otherwise, per IM Active Problems:   History of pulmonary embolism   Chronic kidney disease   UTI (urinary tract infection)   History of hemorrhagic stroke with residual hemiparesis   Dementia   Near syncope   Swallowing difficulty   Melida Quitter , PA-C 8:40 AM 12/14/2013

## 2013-12-14 NOTE — Progress Notes (Signed)
We will sign off. 

## 2013-12-14 NOTE — Care Management Note (Signed)
    Page 1 of 1   12/14/2013     3:14:50 PM CARE MANAGEMENT NOTE 12/14/2013  Patient:  Shelly [name], Shelly [name]   Account Number:  [id number]  Date Initiated:  12/14/2013  Documentation initiated by:  Primrose Oler  Subjective/Objective Assessment:   Pt adm on 12/11/13 with SVT.  PTA, pt resides at home and is cared for by family members.  She is active with [facility], PT, OT , ST, and HH aide.     Action/Plan:   Pt wil need resumption orders for HH at dc.   Anticipated DC Date:  12/14/2013   Anticipated DC Plan:  HOME W HOME HEALTH SERVICES      DC Planning Services  CM consult      [facility] Choice  Resumption Of Svcs/PTA Provider   Choice offered to / List presented to:  C-1 Patient        HH arranged  HH-1 RN  HH-2 PT  HH-3 OT  HH-5 SPEECH THERAPY  HH-4 NURSE'S AIDE      HH agency  [facility]   Status of service:  Completed, signed off Medicare Important Message given?  YES (If response is "NO", the following Medicare IM given date fields will be blank) Date Medicare IM given:  12/11/2013 Date Additional Medicare IM given:  12/14/2013  Discharge Disposition:  HOME W HOME HEALTH SERVICES  Per UR Regulation:  Reviewed for med. necessity/level of care/duration of stay  If discussed at Long Length of Stay Meetings, dates discussed:    Comments:

## 2013-12-16 NOTE — ED Provider Notes (Signed)
CSN: [id number]     Arrival date & time 12/11/13  1238 History   First MD Initiated Contact with Patient 12/11/13 1241     Chief Complaint  Patient presents with  . Loss of Consciousness     (Consider location/radiation/quality/duration/timing/severity/associated sxs/prior Treatment) HPI Comments: Pt comes in with cc of unresponsive episode. Pt is 80 year old female with history of hypertension, pulmonary embolism on chronic Coumadin which was discontinued after she sustained a hemorrhagic stroke 1 month back with residual left-sided weakness, chronic kidney disease stage II , GERD, chronic anemia, history of SVT. Pt  was admitted yesterday for acute encephalopathy secondary to UTI and was discharged this morning. Daughter reports that while she was helping her mother walk, her mother just lost her body tone and became unresponsive. She doesn't think her mother had a syncopal episode, and reports that mother had a blank stare for few minutes. Pt had no seizure like activity or incontinence. Currently, patient is oriented x 2 - however sluggish with her response, and daughter reports that typically her mother is more alert.  Patient is a 80 y.o. female presenting with syncope. The history is provided by the patient.  Loss of Consciousness Associated symptoms: weakness   Associated symptoms: no chest pain, no fever, no shortness of breath and no vomiting     Past Medical History  Diagnosis Date  . Hypertension   . Bronchitis     "not chronic; not recurrent" (11/13/2013)  . Chronic anemia   . GERD (gastroesophageal reflux disease)   . Pulmonary embolism   . DVT (deep venous thrombosis)     "RLE?"  . Rheumatoid arthritis   . Stroke     11/13/2013 "she has a slow bleed"  . Chronic kidney disease (CKD), stage II (mild)     Stage II, creatine clearance 40-50 cc /minute   Past Surgical History  Procedure Laterality Date  . Replacement total knee bilateral Bilateral   . Joint replacement     . Tubal ligation    . Carpal tunnel release    . Dilation and curettage of uterus    . Cataract extraction w/ intraocular lens  implant, bilateral Bilateral    Family History  Problem Relation Age of Onset  . Hypertension Daughter    History  Substance Use Topics  . Smoking status: Never Smoker   . Smokeless tobacco: Never Used  . Alcohol Use: No   OB History   Grav Para Term Preterm Abortions TAB SAB Ect Mult Living                 Review of Systems  Unable to perform ROS: Mental status change  Constitutional: Positive for activity change. Negative for fever.  Respiratory: Negative for cough and shortness of breath.   Cardiovascular: Positive for syncope. Negative for chest pain.  Gastrointestinal: Negative for vomiting.  Genitourinary: Negative for dysuria.  Skin: Negative for rash.  Allergic/Immunologic: Negative for immunocompromised state.  Neurological: Positive for weakness. Negative for syncope.  Hematological: Does not bruise/bleed easily.      Allergies  Aricept  Home Medications   Prior to Admission medications   Medication Sig Start Date End Date Taking? Authorizing Provider  aspirin EC 81 MG tablet Take 1 tablet (81 mg total) by mouth daily. 11/28/13   Evlyn Kanner Love, PA-C  cefUROXime (CEFTIN) 500 MG tablet Take 1 tablet (500 mg total) by mouth once. Tonight only, then stop 12/14/13   Christiane Ha, MD  magnesium oxide (MAG-OX)  400 (241.3 MG) MG tablet Take 1 tablet (400 mg total) by mouth daily. 12/14/13   Christiane Ha, MD  metoprolol tartrate (LOPRESSOR) 25 MG tablet Take 1 tablet (25 mg total) by mouth 2 (two) times daily. 12/14/13   Christiane Ha, MD  mirtazapine (REMERON) 15 MG tablet Take 15 mg by mouth at bedtime.    Historical Provider, MD  pantoprazole (PROTONIX) 40 MG tablet Take 40 mg by mouth daily after breakfast.     Historical Provider, MD  predniSONE (DELTASONE) 5 MG tablet Take 5 mg by mouth every morning.     Historical  Provider, MD  senna-docusate (SENOKOT-S) 8.6-50 MG per tablet Take 1 tablet by mouth 2 (two) times daily. Available over the counter 11/28/13   Evlyn Kanner Love, PA-C   BP 118/72  Pulse 82  Temp(Src) 98.1 F (36.7 C) (Oral)  Resp 19  Ht 5\' 2"  (1.575 m)  Wt 137 lb 5.6 oz (62.3 kg)  BMI 25.11 kg/m2  SpO2 98% Physical Exam  Nursing note and vitals reviewed. Constitutional: She is oriented to person, place, and time. She appears well-developed and well-nourished.  HENT:  Head: Normocephalic and atraumatic.  Eyes: EOM are normal. Pupils are equal, round, and reactive to light.  Neck: Neck supple. No JVD present.  Cardiovascular: Normal rate and regular rhythm.   Murmur heard. Pulmonary/Chest: Effort normal. No respiratory distress.  Abdominal: Soft. She exhibits no distension. There is no tenderness. There is no rebound and no guarding.  Musculoskeletal: She exhibits no edema.  Neurological: She is alert and oriented to person, place, and time.  Skin: Skin is warm and dry.    ED Course  Procedures (including critical care time) Labs Review Labs Reviewed  CBC WITH DIFFERENTIAL - Abnormal; Notable for the following:    WBC 10.8 (*)    Hemoglobin 11.0 (*)    HCT 33.1 (*)    MCV 71.2 (*)    MCH 23.7 (*)    RDW 17.0 (*)    Neutrophils Relative % 84 (*)    Neutro Abs 9.1 (*)    Lymphocytes Relative 10 (*)    All other components within normal limits  BASIC METABOLIC PANEL - Abnormal; Notable for the following:    Sodium 136 (*)    Glucose, Bld 101 (*)    Creatinine, Ser 1.25 (*)    GFR calc non Af Amer 36 (*)    GFR calc Af Amer 42 (*)    All other components within normal limits  URINALYSIS, ROUTINE W REFLEX MICROSCOPIC - Abnormal; Notable for the following:    APPearance HAZY (*)    Hgb urine dipstick TRACE (*)    Protein, ur 30 (*)    Leukocytes, UA MODERATE (*)    All other components within normal limits  URINE MICROSCOPIC-ADD ON - Abnormal; Notable for the following:     Bacteria, UA FEW (*)    All other components within normal limits  MAGNESIUM - Abnormal; Notable for the following:    Magnesium 1.3 (*)    All other components within normal limits  BASIC METABOLIC PANEL - Abnormal; Notable for the following:    Sodium 136 (*)    Creatinine, Ser 1.23 (*)    GFR calc non Af Amer 37 (*)    GFR calc Af Amer 43 (*)    All other components within normal limits  BASIC METABOLIC PANEL - Abnormal; Notable for the following:    Potassium 3.6 (*)  Creatinine, Ser 1.12 (*)    GFR calc non Af Amer 41 (*)    GFR calc Af Amer 48 (*)    All other components within normal limits  URINE CULTURE  MRSA PCR SCREENING  TROPONIN I  TSH  MAGNESIUM  MAGNESIUM    Imaging Review No results found.   EKG Interpretation None      Date: 12/16/2013  Rate: 102  Rhythm: sinus tachycardia, Premature ventricular complex  QRS Axis: normal  Intervals: QT prolonged,   ST/T Wave abnormalities: nonspecific ST/T changes  Conduction Disutrbances:none  Narrative Interpretation:   Old EKG Reviewed: changes noted   MDM   Final diagnoses:  UTI (lower urinary tract infection)  Unresponsive episode  Encephalopathy    Pt had, what appears to be almost an event of absent seizure. Just discharged today - admitted for uti. Pt is afebrile. Denies headaches. Still little sluggish. No nuchal rigidity or rashes on exam. I dont think we need LP at this time - but i certain suspect that patient has encephalopathy. Will admit to medicine.  Derwood Kaplan, MD 12/16/13 [phone]

## 2013-12-18 NOTE — Discharge Summary (Signed)
Physician Discharge Summary  TATYM SCHERMER VHQ:[id number] DOB: 05-23-1919 DOA: 12/11/2013  PCP: Cain Saupe, MD  Admit date: 12/11/2013 Discharge date: 12/14/2013  Time spent: greater than 30 minutes  Recommendations for Outpatient Follow-up:  1. Home PT, OT, RN, ST arranged  Discharge Diagnoses:  Principal Problem:   SVT (supraventricular tachycardia) Active Problems:   History of pulmonary embolism   Chronic kidney disease   UTI (urinary tract infection)   History of hemorrhagic stroke with residual hemiparesis   Dementia   Near syncope   Swallowing difficulty   Discharge Condition: stable  Filed Weights   12/12/13 0648 12/13/13 0540 12/14/13 0729  Weight: 57.5 kg (126 lb 12.2 oz) 62.8 kg (138 lb 7.2 oz) 62.3 kg (137 lb 5.6 oz)    History of present illness:  80 year old female with history of hypertension, pulmonary embolism on chronic Coumadin which was discontinued after she sustained a hemorrhagic stroke 1 month back with residual left-sided weakness, chronic kidney disease stage II , GERD, chronic anemia, history of SVT who was admitted yesterday for acute encephalopathy secondary to UTI and wasn't discharged this morning on oral Keflex. EMS took her home and when they were getting her out of the stretcher she was unsteady with her eyes rolled. Patient did not lose consciousness, have witnessed seizure, bowel or urinary incontinence. She was found to have heart rate in one 65-170 beats per minute. EMS brought her back to the ED. In the ED her vitals were stable although was reported to have pulse in the 40s by the nurse telemetry showed sinus tachycardia and low 100s with multiple PVCs. Blood work done showed a WBC of 10.8, hemoglobin of 11, sodium of 136, potassium 3.9, creatinine 1.25 and initial troponin was negative. Magnesium was low at 1.3. EKG showed sinus tachycardia at 108 with PVCs. Hospitalists called for admission on observation.  History Limited from the patient did  to her underlying dementia and was provided by daughter at bedside. Patient denied any headache, blurred vision, dizziness, nausea, vomiting, chest pain, palpitations, shortness of breath, abdominal pain, bowel or urinary symptoms  Hospital Course:  Admitted to telemetry. Patient had several more brief episodes of SVT.  Diltiazem stopped. Changed to metoprolol.  Also, patient had been started on ritalin during her stay at rehab. Likely contributing, so this has been stopped.  Cardiology consulted and agreed with medication changes.  By discharge, no significant arrhythmias for >48 hours.  During hospitalization, nursing staff reported swallowing difficulties.  Speech therapy reconsulted and recommended downgrading to pureed food.  Will need home ST to follow, hopefully advance diet.  Procedures: None  Consultations:  cardiology  Discharge Exam: Filed Vitals:   12/14/13 1341  BP: 118/72  Pulse: 82  Temp: 98.1 F (36.7 C)  Resp: 19    General: alert, confused. Cardiovascular: RRR Respiratory: CTA   Discharge Instructions   Discharge instructions    Complete by:  As directed   Pureed diet. Crush medicine in puree     Walk with assistance    Complete by:  As directed             Medication List    STOP taking these medications       CALCIUM 600+D 600-400 MG-UNIT per tablet  Generic drug:  Calcium Carbonate-Vitamin D     diltiazem 30 MG tablet  Commonly known as:  CARDIZEM     methylphenidate 5 MG tablet  Commonly known as:  RITALIN      TAKE these  medications       aspirin EC 81 MG tablet  Take 1 tablet (81 mg total) by mouth daily.     cefUROXime 500 MG tablet  Commonly known as:  CEFTIN  Take 1 tablet (500 mg total) by mouth once. Tonight only, then stop     magnesium oxide 400 (241.3 MG) MG tablet  Commonly known as:  MAG-OX  Take 1 tablet (400 mg total) by mouth daily.     metoprolol tartrate 25 MG tablet  Commonly known as:  LOPRESSOR  Take 1  tablet (25 mg total) by mouth 2 (two) times daily.     mirtazapine 15 MG tablet  Commonly known as:  REMERON  Take 15 mg by mouth at bedtime.     pantoprazole 40 MG tablet  Commonly known as:  PROTONIX  Take 40 mg by mouth daily after breakfast.     predniSONE 5 MG tablet  Commonly known as:  DELTASONE  Take 5 mg by mouth every morning.     senna-docusate 8.6-50 MG per tablet  Commonly known as:  Senokot-S  Take 1 tablet by mouth 2 (two) times daily. Available over the counter       Allergies  Allergen Reactions  . Aricept [Donepezil Hcl] Nausea And Vomiting    She becomes violently ill per daughter.       Follow-up Information   Follow up with FULP, CAMMIE, MD In 2 weeks.   Specialty:  Family Medicine   Contact information:   [address] [place] [address] [phone]        The results of significant diagnostics from this hospitalization (including imaging, microbiology, ancillary and laboratory) are listed below for reference.    Significant Diagnostic Studies: Dg Chest 1 View  12/10/2013   CLINICAL DATA:  Altered mental status.  EXAM: CHEST - 1 VIEW  COMPARISON:  PA and lateral chest 10/21/2012 11/13/2013. CT chest 09/11/2009.  FINDINGS: Pulmonary fibrotic change and scattered bronchiectasis, most notable in the bases, are again seen. There is some subsegmental atelectasis in the left lung base. Right lung is clear. Heart size is upper normal.  IMPRESSION: No acute disease.  Stable compared to prior exam.   Electronically Signed   By: Drusilla Kanner M.D.   On: 12/10/2013 18:35   Ct Head (brain) Wo Contrast  12/10/2013   CLINICAL DATA:  Disorientation.  Recent CVA.  EXAM: CT HEAD WITHOUT CONTRAST  TECHNIQUE: Contiguous axial images were obtained from the base of the skull through the vertex without contrast.  COMPARISON:  None  FINDINGS: Mild atrophy and small vessel disease. Continued resolution of hemorrhagic inferior right caudate lesion, with  no new areas of hemorrhage or infarction. Bilateral basal ganglia calcification. No hydrocephalus, mass lesion, or extra-axial fluid. Calvarium intact. Venous lakes. Vascular calcification. No sinus disease.  IMPRESSION: Resolving right caudate hemorrhage. No acute intracranial abnormality. Chronic changes as described.   Electronically Signed   By: Davonna Belling M.D.   On: 12/10/2013 18:49   Ct Head Wo Contrast  11/27/2013   CLINICAL DATA:  Follow-up intracranial hemorrhage.  EXAM: CT HEAD WITHOUT CONTRAST  TECHNIQUE: Contiguous axial images were obtained from the base of the skull through the vertex without intravenous contrast.  COMPARISON:  CT HEAD W/O CM dated 11/14/2013; CT HEAD W/O CM dated 11/13/2013  FINDINGS: 13 mm hypodensity in right inferior basal ganglia at site of prior hemorrhage. Bilateral basal ganglia mineralization.  No intraparenchymal hemorrhage, mass effect or midline  shift. Patchy to confluent supratentorial white matter hypodensities again seen without acute large vascular territory infarct.  Basal cisterns are patent. Moderate calcific atherosclerosis the carotid siphons. Trace of sphenoid mucosal thickening without paranasal sinus air-fluid levels. The mastoid air cells are well aerated.  No skull fracture. Ocular globes and orbital contents are nonsuspicious, status post bilateral ocular lens implants.  IMPRESSION: Right inferior basal ganglia infarct at the area of prior hemorrhage with no new bleed nor acute intracranial process.  Involutional changes. Moderate white matter changes suggest chronic small vessel ischemic disease.   Electronically Signed   By: Awilda Metro   On: 11/27/2013 06:35    Microbiology: Recent Results (from the past 240 hour(s))  URINE CULTURE     Status: None   Collection Time    12/11/13  1:49 PM      Result Value Ref Range Status   Specimen Description URINE, CATHETERIZED   Final   Special Requests NONE   Final   Culture  Setup Time     Final    Value: 12/11/2013 19:22     Performed at [facility]   Colony Count     Final   Value: NO GROWTH     Performed at [facility]   Culture     Final   Value: NO GROWTH     Performed at [facility]   Report Status 12/12/2013 FINAL   Final  MRSA PCR SCREENING     Status: None   Collection Time    12/13/13  6:50 AM      Result Value Ref Range Status   MRSA by PCR NEGATIVE  NEGATIVE Final   Comment:            The GeneXpert MRSA Assay (FDA     approved for NASAL specimens     only), is one component of a     comprehensive MRSA colonization     surveillance program. It is not     intended to diagnose MRSA     infection nor to guide or     monitor treatment for     MRSA infections.     Labs: Basic Metabolic Panel:  Recent Labs Lab 12/12/13 0652 12/13/13 1112  NA 136* 137  K 4.8 3.6*  CL 101 98  CO2 20 24  GLUCOSE 80 91  BUN 10 8  CREATININE 1.23* 1.12*  CALCIUM 9.3 9.3  MG 2.0 1.6   Liver Function Tests: No results found for this basename: AST, ALT, ALKPHOS, BILITOT, PROT, ALBUMIN,  in the last 168 hours No results found for this basename: LIPASE, AMYLASE,  in the last 168 hours No results found for this basename: AMMONIA,  in the last 168 hours CBC: No results found for this basename: WBC, NEUTROABS, HGB, HCT, MCV, PLT,  in the last 168 hours Cardiac Enzymes: No results found for this basename: CKTOTAL, CKMB, CKMBINDEX, TROPONINI,  in the last 168 hours BNP: BNP (last 3 results) No results found for this basename: PROBNP,  in the last 8760 hours CBG: No results found for this basename: GLUCAP,  in the last 168 hours  Signed:  Maribell Demeo L  [facility] 12/18/2013, 7:05 PM

## 2013-12-21 NOTE — ED Provider Notes (Signed)
Medical screening examination/treatment/procedure(s) were conducted as a shared visit with non-physician practitioner(s) and myself.  I personally evaluated the patient during the encounter.   EKG Interpretation   Date/Time:  Sunday December 10 2013 17:36:26 EDT Ventricular Rate:  101 PR Interval:  132 QRS Duration: 79 QT Interval:  331 QTC Calculation: 429 R Axis:   35 Text Interpretation:  Sinus tachycardia Multiple premature complexes, vent   Anteroseptal infarct, old Borderline repolarization abnormality ED  PHYSICIAN INTERPRETATION AVAILABLE IN [facility] Confirmed by TEST,  Record ([id number]) on 12/12/2013 7:25:37 AM      Care discussed with  PA Dahlia Client.  Pt evaluated independently.  Family concerned about change in mentation. Low-grade fever. Evaluation shows urinary tract infection. Mild generalized weakness. I agree with admission. Agree with the assessment and plan as above.  Rolland Porter, MD 12/21/13 [phone]

## 2014-01-15 ENCOUNTER — Inpatient Hospital Stay: Payer: Medicare Other | Admitting: Physical Medicine & Rehabilitation

## 2014-03-02 ENCOUNTER — Encounter: Payer: Medicare Other | Attending: Physical Medicine & Rehabilitation

## 2014-03-02 ENCOUNTER — Inpatient Hospital Stay: Payer: Medicare Other | Admitting: Physical Medicine & Rehabilitation

## 2014-04-27 ENCOUNTER — Telehealth: Payer: Self-pay | Admitting: Neurology

## 2014-04-27 NOTE — Telephone Encounter (Signed)
Called patient regarding scheduling appointment with one of our other providers, patient is former Dr. Hosie Poisson patient, was told that patient does not wish to schedule anything with [place] since she is seeing another doctor.

## 2014-09-17 ENCOUNTER — Ambulatory Visit: Payer: Self-pay | Admitting: Internal Medicine

## 2015-02-08 ENCOUNTER — Ambulatory Visit
Admission: RE | Admit: 2015-02-08 | Discharge: 2015-02-08 | Disposition: A | Discharge status: Home / Self Care | Payer: Medicare Other | Source: Ambulatory Visit | Attending: Family Medicine | Admitting: Family Medicine

## 2015-02-08 ENCOUNTER — Encounter (HOSPITAL_COMMUNITY): Payer: Self-pay

## 2015-02-08 ENCOUNTER — Emergency Department (HOSPITAL_COMMUNITY)
Admission: EM | Admit: 2015-02-08 | Discharge: 2015-02-08 | Disposition: A | Discharge status: Home / Self Care | Payer: Medicare Other | Attending: Emergency Medicine | Admitting: Emergency Medicine

## 2015-02-08 ENCOUNTER — Other Ambulatory Visit: Payer: Self-pay | Admitting: Family Medicine

## 2015-02-08 DIAGNOSIS — Z79899 Other long term (current) drug therapy: Secondary | ICD-10-CM | POA: Insufficient documentation

## 2015-02-08 DIAGNOSIS — R2243 Localized swelling, mass and lump, lower limb, bilateral: Secondary | ICD-10-CM | POA: Diagnosis present

## 2015-02-08 DIAGNOSIS — R600 Localized edema: Secondary | ICD-10-CM | POA: Diagnosis not present

## 2015-02-08 DIAGNOSIS — Z86718 Personal history of other venous thrombosis and embolism: Secondary | ICD-10-CM | POA: Diagnosis not present

## 2015-02-08 DIAGNOSIS — Z7952 Long term (current) use of systemic steroids: Secondary | ICD-10-CM | POA: Insufficient documentation

## 2015-02-08 DIAGNOSIS — Z86711 Personal history of pulmonary embolism: Secondary | ICD-10-CM | POA: Insufficient documentation

## 2015-02-08 DIAGNOSIS — R609 Edema, unspecified: Secondary | ICD-10-CM

## 2015-02-08 DIAGNOSIS — D509 Iron deficiency anemia, unspecified: Secondary | ICD-10-CM | POA: Diagnosis not present

## 2015-02-08 DIAGNOSIS — Z7982 Long term (current) use of aspirin: Secondary | ICD-10-CM | POA: Insufficient documentation

## 2015-02-08 DIAGNOSIS — R0602 Shortness of breath: Secondary | ICD-10-CM

## 2015-02-08 DIAGNOSIS — K219 Gastro-esophageal reflux disease without esophagitis: Secondary | ICD-10-CM | POA: Insufficient documentation

## 2015-02-08 DIAGNOSIS — Z8709 Personal history of other diseases of the respiratory system: Secondary | ICD-10-CM | POA: Insufficient documentation

## 2015-02-08 DIAGNOSIS — M069 Rheumatoid arthritis, unspecified: Secondary | ICD-10-CM | POA: Diagnosis not present

## 2015-02-08 DIAGNOSIS — Z8673 Personal history of transient ischemic attack (TIA), and cerebral infarction without residual deficits: Secondary | ICD-10-CM | POA: Insufficient documentation

## 2015-02-08 DIAGNOSIS — N289 Disorder of kidney and ureter, unspecified: Secondary | ICD-10-CM

## 2015-02-08 DIAGNOSIS — N182 Chronic kidney disease, stage 2 (mild): Secondary | ICD-10-CM | POA: Diagnosis not present

## 2015-02-08 DIAGNOSIS — I129 Hypertensive chronic kidney disease with stage 1 through stage 4 chronic kidney disease, or unspecified chronic kidney disease: Secondary | ICD-10-CM | POA: Insufficient documentation

## 2015-02-08 LAB — COMPREHENSIVE METABOLIC PANEL
ALT: 13 U/L — ABNORMAL LOW (ref 14–54)
AST: 18 U/L (ref 15–41)
Albumin: 3.1 g/dL — ABNORMAL LOW (ref 3.5–5.0)
Alkaline Phosphatase: 53 U/L (ref 38–126)
Anion gap: 8 (ref 5–15)
BUN: 22 mg/dL — ABNORMAL HIGH (ref 6–20)
CO2: 24 mmol/L (ref 22–32)
Calcium: 9.2 mg/dL (ref 8.9–10.3)
Chloride: 105 mmol/L (ref 101–111)
Creatinine, Ser: 1.36 mg/dL — ABNORMAL HIGH (ref 0.44–1.00)
GFR calc Af Amer: 37 mL/min — ABNORMAL LOW (ref >60)
GFR calc non Af Amer: 32 mL/min — ABNORMAL LOW (ref >60)
Glucose, Bld: 115 mg/dL — ABNORMAL HIGH (ref 65–99)
Potassium: 4.9 mmol/L (ref 3.5–5.1)
Sodium: 137 mmol/L (ref 135–145)
Total Bilirubin: 0.5 mg/dL (ref 0.3–1.2)
Total Protein: 8.2 g/dL — ABNORMAL HIGH (ref 6.5–8.1)

## 2015-02-08 LAB — CBC WITH DIFFERENTIAL/PLATELET
Basophils Absolute: 0 10*3/uL (ref 0.0–0.1)
Basophils Relative: 0 % (ref 0–1)
Eosinophils Absolute: 0.1 10*3/uL (ref 0.0–0.7)
Eosinophils Relative: 1 % (ref 0–5)
HCT: 32.2 % — ABNORMAL LOW (ref 36.0–46.0)
Hemoglobin: 10.6 g/dL — ABNORMAL LOW (ref 12.0–15.0)
Lymphocytes Relative: 18 % (ref 12–46)
Lymphs Abs: 2.3 10*3/uL (ref 0.7–4.0)
MCH: 23.9 pg — ABNORMAL LOW (ref 26.0–34.0)
MCHC: 32.9 g/dL (ref 30.0–36.0)
MCV: 72.5 fL — ABNORMAL LOW (ref 78.0–100.0)
Monocytes Absolute: 0.9 10*3/uL (ref 0.1–1.0)
Monocytes Relative: 7 % (ref 3–12)
Neutro Abs: 9.3 10*3/uL — ABNORMAL HIGH (ref 1.7–7.7)
Neutrophils Relative %: 74 % (ref 43–77)
Platelets: 324 10*3/uL (ref 150–400)
RBC: 4.44 MIL/uL (ref 3.87–5.11)
RDW: 15.8 % — ABNORMAL HIGH (ref 11.5–15.5)
WBC: 12.5 10*3/uL — ABNORMAL HIGH (ref 4.0–10.5)

## 2015-02-08 LAB — TROPONIN I: Troponin I: 0.03 ng/mL (ref <0.031)

## 2015-02-08 LAB — BRAIN NATRIURETIC PEPTIDE: B Natriuretic Peptide: 157.9 pg/mL — ABNORMAL HIGH (ref 0.0–100.0)

## 2015-02-08 MED ORDER — FUROSEMIDE 20 MG PO TABS: 20.0000 mg | ORAL_TABLET | Freq: Every day | ORAL | Status: DC

## 2015-02-08 NOTE — Discharge Instructions (Signed)
Follow up with your doctor next week to recheck your kidney tests.  Edema Edema is an abnormal buildup of fluids in your bodytissues. Edema is somewhatdependent on gravity to pull the fluid to the lowest place in your body. That makes the condition more common in the legs and thighs (lower extremities). Painless swelling of the feet and ankles is common and becomes more likely as you get older. It is also common in looser tissues, like around your eyes.  When the affected area is squeezed, the fluid may move out of that spot and leave a dent for a few moments. This dent is called pitting.  CAUSES  There are many possible causes of edema. Eating too much salt and being on your feet or sitting for a long time can cause edema in your legs and ankles. Hot weather may make edema worse. Common medical causes of edema include:  Heart failure.  Liver disease.  Kidney disease.  Weak blood vessels in your legs.  Cancer.  An injury.  Pregnancy.  Some medications.  Obesity. SYMPTOMS  Edema is usually painless.Your skin may look swollen or shiny.  DIAGNOSIS  Your health care provider may be able to diagnose edema by asking about your medical history and doing a physical exam. You may need to have tests such as X-rays, an electrocardiogram, or blood tests to check for medical conditions that may cause edema.  TREATMENT  Edema treatment depends on the cause. If you have heart, liver, or kidney disease, you need the treatment appropriate for these conditions. General treatment may include:  Elevation of the affected body part above the level of your heart.  Compression of the affected body part. Pressure from elastic bandages or support stockings squeezes the tissues and forces fluid back into the blood vessels. This keeps fluid from entering the tissues.  Restriction of fluid and salt intake.  Use of a water pill (diuretic). These medications are appropriate only for some types of edema.  They pull fluid out of your body and make you urinate more often. This gets rid of fluid and reduces swelling, but diuretics can have side effects. Only use diuretics as directed by your health care provider. HOME CARE INSTRUCTIONS   Keep the affected body part above the level of your heart when you are lying down.   Do not sit still or stand for prolonged periods.   Do not put anything directly under your knees when lying down.  Do not wear constricting clothing or garters on your upper legs.   Exercise your legs to work the fluid back into your blood vessels. This may help the swelling go down.   Wear elastic bandages or support stockings to reduce ankle swelling as directed by your health care provider.   Eat a low-salt diet to reduce fluid if your health care provider recommends it.   Only take medicines as directed by your health care provider. SEEK MEDICAL CARE IF:   Your edema is not responding to treatment.  You have heart, liver, or kidney disease and notice symptoms of edema.  You have edema in your legs that does not improve after elevating them.   You have sudden and unexplained weight gain. SEEK IMMEDIATE MEDICAL CARE IF:   You develop shortness of breath or chest pain.   You cannot breathe when you lie down.  You develop pain, redness, or warmth in the swollen areas.   You have heart, liver, or kidney disease and suddenly get edema.  You have a fever and your symptoms suddenly get worse. MAKE SURE YOU:   Understand these instructions.  Will watch your condition.  Will get help right away if you are not doing well or get worse. Document Released: 06/22/2005 Document Revised: 11/06/2013 Document Reviewed: 04/14/2013 [employer] Patient Information 2015 [place], [place]. This information is not intended to replace advice given to you by your health care provider. Make sure you discuss any questions you have with your health care provider.  Furosemide  tablets What is this medicine? FUROSEMIDE (fyoor [place] se mide) is a diuretic. It helps you make more urine and to lose salt and excess water from your body. This medicine is used to treat high blood pressure, and edema or swelling from heart, kidney, or liver disease. This medicine may be used for other purposes; ask your health care provider or pharmacist if you have questions. COMMON BRAND NAME(S): Delone, Lasix What should I tell my health care provider before I take this medicine? They need to know if you have any of these conditions: -abnormal blood electrolytes -diarrhea or vomiting -gout -heart disease -kidney disease, small amounts of urine, or difficulty passing urine -liver disease -an unusual or allergic reaction to furosemide, sulfa drugs, other medicines, foods, dyes, or preservatives -pregnant or trying to get pregnant -breast-feeding How should I use this medicine? Take this medicine by mouth with a glass of water. Follow the directions on the prescription label. You may take this medicine with or without food. If it upsets your stomach, take it with food or milk. Do not take your medicine more often than directed. Remember that you will need to pass more urine after taking this medicine. Do not take your medicine at a time of day that will cause you problems. Do not take at bedtime. Talk to your pediatrician regarding the use of this medicine in children. While this drug may be prescribed for selected conditions, precautions do apply. Overdosage: If you think you have taken too much of this medicine contact a poison control center or emergency room at once. NOTE: This medicine is only for you. Do not share this medicine with others. What if I miss a dose? If you miss a dose, take it as soon as you can. If it is almost time for your next dose, take only that dose. Do not take double or extra doses. What may interact with this medicine? -aspirin and aspirin-like  medicines -certain antibiotics -chloral hydrate -cisplatin -cyclosporine -digoxin -diuretics -laxatives -lithium -medicines for blood pressure -medicines that relax muscles for surgery -methotrexate -NSAIDs, medicines for pain and inflammation like ibuprofen, naproxen, or indomethacin -phenytoin -steroid medicines like prednisone or cortisone -sucralfate This list may not describe all possible interactions. Give your health care provider a list of all the medicines, herbs, non-prescription drugs, or dietary supplements you use. Also tell them if you smoke, drink alcohol, or use illegal drugs. Some items may interact with your medicine. What should I watch for while using this medicine? Visit your doctor or health care professional for regular checks on your progress. Check your blood pressure regularly. Ask your doctor or health care professional what your blood pressure should be, and when you should contact him or her. If you are a diabetic, check your blood sugar as directed. You may need to be on a special diet while taking this medicine. Check with your doctor. Also, ask how many glasses of fluid you need to drink a day. You must not get dehydrated. You may  get drowsy or dizzy. Do not drive, use machinery, or do anything that needs mental alertness until you know how this drug affects you. Do not stand or sit up quickly, especially if you are an older patient. This reduces the risk of dizzy or fainting spells. Alcohol can make you more drowsy and dizzy. Avoid alcoholic drinks. This medicine can make you more sensitive to the sun. Keep out of the sun. If you cannot avoid being in the sun, wear protective clothing and use sunscreen. Do not use sun lamps or tanning beds/booths. What side effects may I notice from receiving this medicine? Side effects that you should report to your doctor or health care professional as soon as possible: -blood in urine or stools -dry mouth -fever or  chills -hearing loss or ringing in the ears -irregular heartbeat -muscle pain or weakness, cramps -skin rash -stomach upset, pain, or nausea -tingling or numbness in the hands or feet -unusually weak or tired -vomiting or diarrhea -yellowing of the eyes or skin Side effects that usually do not require medical attention (report to your doctor or health care professional if they continue or are bothersome): -headache -loss of appetite -unusual bleeding or bruising This list may not describe all possible side effects. Call your doctor for medical advice about side effects. You may report side effects to FDA at 1-800-FDA-1088. Where should I keep my medicine? Keep out of the reach of children. Store at room temperature between 15 and 30 degrees C (59 and 86 degrees F). Protect from light. Throw away any unused medicine after the expiration date. NOTE: This sheet is a summary. It may not cover all possible information. If you have questions about this medicine, talk to your doctor, pharmacist, or health care provider.  2015, [employer]. (2009-06-10 16:24:50)

## 2015-02-08 NOTE — ED Notes (Signed)
Questions r/t dc were denied. Pt will use wheelchair for transport home. Pt is at baseline orientation

## 2015-02-08 NOTE — ED Provider Notes (Signed)
CSN: [id number]     Arrival date & time 02/08/15  1624 History   First MD Initiated Contact with Patient 02/08/15 2018     Chief Complaint  Patient presents with  . Leg Swelling     (Consider location/radiation/quality/duration/timing/severity/associated sxs/prior Treatment) The history is provided by the patient and a relative.  81 year-old female saw her PCP today who ordered some blood work. She got home and got a phone call from her physician stating that she had to get to the hospital right away because her blood tested she had congestive heart failure. She denies any dyspnea. There is no chest pain. She has had some slight swelling in her legs over the last 2 weeks.  Past Medical History  Diagnosis Date  . Hypertension   . Bronchitis     "not chronic; not recurrent" (11/13/2013)  . Chronic anemia   . GERD (gastroesophageal reflux disease)   . Pulmonary embolism   . DVT (deep venous thrombosis)     "RLE?"  . Rheumatoid arthritis   . Stroke     11/13/2013 "she has a slow bleed"  . Chronic kidney disease (CKD), stage II (mild)     Stage II, creatine clearance 40-50 cc /minute   Past Surgical History  Procedure Laterality Date  . Replacement total knee bilateral Bilateral   . Joint replacement    . Tubal ligation    . Carpal tunnel release    . Dilation and curettage of uterus    . Cataract extraction w/ intraocular lens  implant, bilateral Bilateral    Family History  Problem Relation Age of Onset  . Hypertension Daughter    History  Substance Use Topics  . Smoking status: Never Smoker   . Smokeless tobacco: Never Used  . Alcohol Use: No   OB History    No data available     Review of Systems  All other systems reviewed and are negative.     Allergies  Aricept  Home Medications   Prior to Admission medications   Medication Sig Start Date End Date Taking? Authorizing Provider  aspirin EC 81 MG tablet Take 1 tablet (81 mg total) by mouth daily. 11/28/13   Yes Evlyn Kanner Love, PA-C  ferrous sulfate 325 (65 FE) MG tablet Take 325 mg by mouth daily with breakfast.   Yes Historical Provider, MD  magnesium oxide (MAG-OX) 400 (241.3 MG) MG tablet Take 1 tablet (400 mg total) by mouth daily. 12/14/13  Yes Christiane Ha, MD  metoprolol tartrate (LOPRESSOR) 25 MG tablet Take 1 tablet (25 mg total) by mouth 2 (two) times daily. 12/14/13  Yes Christiane Ha, MD  mirtazapine (REMERON) 15 MG tablet Take 15 mg by mouth at bedtime.   Yes Historical Provider, MD  pantoprazole (PROTONIX) 40 MG tablet Take 40 mg by mouth daily after breakfast.    Yes Historical Provider, MD  predniSONE (DELTASONE) 5 MG tablet Take 5 mg by mouth every morning.    Yes Historical Provider, MD  senna-docusate (SENOKOT-S) 8.6-50 MG per tablet Take 1 tablet by mouth 2 (two) times daily. Available over the counter Patient taking differently: Take 1 tablet by mouth daily. Available over the counter 11/28/13  Yes Evlyn Kanner Love, PA-C   BP 135/57 mmHg  Pulse 75  Temp(Src) 97.9 F (36.6 C) (Oral)  Resp 18  Ht 5\' 1"  (1.549 m)  Wt 137 lb (62.143 kg)  BMI 25.90 kg/m2  SpO2 96% Physical Exam  Nursing note and  vitals reviewed.  81 year old female, resting comfortably and in no acute distress. Vital signs are normal. Oxygen saturation is 96%, which is normal. Head is normocephalic and atraumatic. PERRLA, EOMI. Oropharynx is clear. Neck is nontender and supple without adenopathy or JVD. Back is nontender and there is no CVA tenderness. Lungs are clear without rales, wheezes, or rhonchi. Chest is nontender. Heart has regular rate and rhythm without murmur. Abdomen is soft, flat, nontender without masses or hepatosplenomegaly and peristalsis is normoactive. Extremities have 1+ edema, full range of motion is present. Deformity of long-standing rheumatoid arthritis present. Skin is warm and dry without rash. Neurologic: Mental status is normal, cranial nerves are intact, there are no  motor or sensory deficits.  ED Course  Procedures (including critical care time) Labs Review Results for orders placed or performed during the hospital encounter of 02/08/15  Comprehensive metabolic panel  Result Value Ref Range   Sodium 137 135 - 145 mmol/L   Potassium 4.9 3.5 - 5.1 mmol/L   Chloride 105 101 - 111 mmol/L   CO2 24 22 - 32 mmol/L   Glucose, Bld 115 (H) 65 - 99 mg/dL   BUN 22 (H) 6 - 20 mg/dL   Creatinine, Ser 3.82 (H) 0.44 - 1.00 mg/dL   Calcium 9.2 8.9 - 50.5 mg/dL   Total Protein 8.2 (H) 6.5 - 8.1 g/dL   Albumin 3.1 (L) 3.5 - 5.0 g/dL   AST 18 15 - 41 U/L   ALT 13 (L) 14 - 54 U/L   Alkaline Phosphatase 53 38 - 126 U/L   Total Bilirubin 0.5 0.3 - 1.2 mg/dL   GFR calc non Af Amer 32 (L) >60 mL/min   GFR calc Af Amer 37 (L) >60 mL/min   Anion gap 8 5 - 15  CBC with Differential  Result Value Ref Range   WBC 12.5 (H) 4.0 - 10.5 K/uL   RBC 4.44 3.87 - 5.11 MIL/uL   Hemoglobin 10.6 (L) 12.0 - 15.0 g/dL   HCT 39.7 (L) 67.3 - 41.9 %   MCV 72.5 (L) 78.0 - 100.0 fL   MCH 23.9 (L) 26.0 - 34.0 pg   MCHC 32.9 30.0 - 36.0 g/dL   RDW 37.9 (H) 02.4 - 09.7 %   Platelets 324 150 - 400 K/uL   Neutrophils Relative % 74 43 - 77 %   Neutro Abs 9.3 (H) 1.7 - 7.7 K/uL   Lymphocytes Relative 18 12 - 46 %   Lymphs Abs 2.3 0.7 - 4.0 K/uL   Monocytes Relative 7 3 - 12 %   Monocytes Absolute 0.9 0.1 - 1.0 K/uL   Eosinophils Relative 1 0 - 5 %   Eosinophils Absolute 0.1 0.0 - 0.7 K/uL   Basophils Relative 0 0 - 1 %   Basophils Absolute 0.0 0.0 - 0.1 K/uL  Troponin I  Result Value Ref Range   Troponin I 0.03 <0.031 ng/mL  Brain natriuretic peptide  Result Value Ref Range   B Natriuretic Peptide 157.9 (H) 0.0 - 100.0 pg/mL   Imaging Review Dg Chest 2 View  02/08/2015   CLINICAL DATA:  Two 3 month history of wheezing, shortness of breath, and chest congestion  EXAM: CHEST  2 VIEW  COMPARISON:  Portable chest x-ray of December 10, 2013  FINDINGS: The lungs are well-expanded. The  interstitial markings are increased especially in the left perihilar region. There is no alveolar infiltrate. There is no pleural effusion. The heart is top-normal in size.  The pulmonary vascularity is not engorged. The mediastinum is normal in width. There is chronic high-grade compression of the body of approximately L1 or L2  IMPRESSION: COPD with chronically increased pulmonary interstitial markings. There has been marked improvement since last year's study. There still may be low-grade interstitial edema versus chronic underlying fibrotic change.   Electronically Signed   By: Kacee Koren  [place] M.D.   On: 02/08/2015 14:37     MDM   Final diagnoses:  Peripheral edema  Microcytic anemia  Renal insufficiency    Reported lab abnormality. I have attempted to access the [facility] system through care everywhere but am unable to find any record of her visit today or her laboratory reports from today. Chest x-ray is accessed through [facility] and shows no evidence of congestive heart failure. CBC, comprehensive metabolic panel, BNP and troponin will be checked.  Above noted tests showed mild elevation of BNP and minor anemia, as well as renal insufficiency with creatinine slightly higher than it was 2 months ago. She is discharged with a prescription for furosemide, and advised to follow-up with PCP next week to make sure renal function is not worsening.    Dione Booze, MD 02/09/15 [phone]

## 2015-02-08 NOTE — ED Notes (Signed)
Patient's family reports bilateral feet and ankle swelling x 2 weeks.

## 2015-02-11 ENCOUNTER — Encounter: Payer: Self-pay | Admitting: Interventional Cardiology

## 2015-03-25 ENCOUNTER — Emergency Department (HOSPITAL_COMMUNITY): Payer: Medicare Other

## 2015-03-25 ENCOUNTER — Emergency Department (HOSPITAL_COMMUNITY)
Admission: EM | Admit: 2015-03-25 | Discharge: 2015-03-25 | Disposition: A | Discharge status: Home / Self Care | Payer: Medicare Other | Attending: Emergency Medicine | Admitting: Emergency Medicine

## 2015-03-25 ENCOUNTER — Encounter (HOSPITAL_COMMUNITY): Payer: Self-pay | Admitting: Emergency Medicine

## 2015-03-25 DIAGNOSIS — K219 Gastro-esophageal reflux disease without esophagitis: Secondary | ICD-10-CM | POA: Diagnosis not present

## 2015-03-25 DIAGNOSIS — M069 Rheumatoid arthritis, unspecified: Secondary | ICD-10-CM | POA: Insufficient documentation

## 2015-03-25 DIAGNOSIS — Z79899 Other long term (current) drug therapy: Secondary | ICD-10-CM | POA: Diagnosis not present

## 2015-03-25 DIAGNOSIS — Z86718 Personal history of other venous thrombosis and embolism: Secondary | ICD-10-CM | POA: Insufficient documentation

## 2015-03-25 DIAGNOSIS — K5732 Diverticulitis of large intestine without perforation or abscess without bleeding: Secondary | ICD-10-CM

## 2015-03-25 DIAGNOSIS — I129 Hypertensive chronic kidney disease with stage 1 through stage 4 chronic kidney disease, or unspecified chronic kidney disease: Secondary | ICD-10-CM | POA: Diagnosis not present

## 2015-03-25 DIAGNOSIS — R531 Weakness: Secondary | ICD-10-CM | POA: Diagnosis present

## 2015-03-25 DIAGNOSIS — D649 Anemia, unspecified: Secondary | ICD-10-CM | POA: Insufficient documentation

## 2015-03-25 DIAGNOSIS — N182 Chronic kidney disease, stage 2 (mild): Secondary | ICD-10-CM | POA: Diagnosis not present

## 2015-03-25 DIAGNOSIS — Z7952 Long term (current) use of systemic steroids: Secondary | ICD-10-CM | POA: Diagnosis not present

## 2015-03-25 DIAGNOSIS — Z8709 Personal history of other diseases of the respiratory system: Secondary | ICD-10-CM | POA: Diagnosis not present

## 2015-03-25 DIAGNOSIS — F0390 Unspecified dementia without behavioral disturbance: Secondary | ICD-10-CM | POA: Diagnosis not present

## 2015-03-25 DIAGNOSIS — Z86711 Personal history of pulmonary embolism: Secondary | ICD-10-CM | POA: Diagnosis not present

## 2015-03-25 DIAGNOSIS — Z8673 Personal history of transient ischemic attack (TIA), and cerebral infarction without residual deficits: Secondary | ICD-10-CM | POA: Diagnosis not present

## 2015-03-25 DIAGNOSIS — Z7982 Long term (current) use of aspirin: Secondary | ICD-10-CM | POA: Diagnosis not present

## 2015-03-25 LAB — URINALYSIS, ROUTINE W REFLEX MICROSCOPIC
Bilirubin Urine: NEGATIVE
Glucose, UA: NEGATIVE mg/dL
Hgb urine dipstick: NEGATIVE
Ketones, ur: NEGATIVE mg/dL
Leukocytes, UA: NEGATIVE
Nitrite: NEGATIVE
Protein, ur: NEGATIVE mg/dL
Specific Gravity, Urine: 1.009 (ref 1.005–1.030)
Urobilinogen, UA: 1 mg/dL (ref 0.0–1.0)
pH: 7 (ref 5.0–8.0)

## 2015-03-25 LAB — COMPREHENSIVE METABOLIC PANEL
ALT: 12 U/L — ABNORMAL LOW (ref 14–54)
AST: 18 U/L (ref 15–41)
Albumin: 3.2 g/dL — ABNORMAL LOW (ref 3.5–5.0)
Alkaline Phosphatase: 46 U/L (ref 38–126)
Anion gap: 8 (ref 5–15)
BUN: 24 mg/dL — ABNORMAL HIGH (ref 6–20)
CO2: 27 mmol/L (ref 22–32)
Calcium: 9 mg/dL (ref 8.9–10.3)
Chloride: 100 mmol/L — ABNORMAL LOW (ref 101–111)
Creatinine, Ser: 1.67 mg/dL — ABNORMAL HIGH (ref 0.44–1.00)
GFR calc Af Amer: 29 mL/min — ABNORMAL LOW (ref >60)
GFR calc non Af Amer: 25 mL/min — ABNORMAL LOW (ref >60)
Glucose, Bld: 108 mg/dL — ABNORMAL HIGH (ref 65–99)
Potassium: 4.6 mmol/L (ref 3.5–5.1)
Sodium: 135 mmol/L (ref 135–145)
Total Bilirubin: 0.9 mg/dL (ref 0.3–1.2)
Total Protein: 8.3 g/dL — ABNORMAL HIGH (ref 6.5–8.1)

## 2015-03-25 LAB — CBC WITH DIFFERENTIAL/PLATELET
Basophils Absolute: 0 10*3/uL (ref 0.0–0.1)
Basophils Relative: 0 %
Eosinophils Absolute: 0.1 10*3/uL (ref 0.0–0.7)
Eosinophils Relative: 1 %
HCT: 30.6 % — ABNORMAL LOW (ref 36.0–46.0)
Hemoglobin: 10.1 g/dL — ABNORMAL LOW (ref 12.0–15.0)
Lymphocytes Relative: 8 %
Lymphs Abs: 1 10*3/uL (ref 0.7–4.0)
MCH: 23.9 pg — ABNORMAL LOW (ref 26.0–34.0)
MCHC: 33 g/dL (ref 30.0–36.0)
MCV: 72.3 fL — ABNORMAL LOW (ref 78.0–100.0)
Monocytes Absolute: 1 10*3/uL (ref 0.1–1.0)
Monocytes Relative: 8 %
Neutro Abs: 10.4 10*3/uL — ABNORMAL HIGH (ref 1.7–7.7)
Neutrophils Relative %: 83 %
Platelets: 332 10*3/uL (ref 150–400)
RBC: 4.23 MIL/uL (ref 3.87–5.11)
RDW: 15.4 % (ref 11.5–15.5)
WBC: 12.6 10*3/uL — ABNORMAL HIGH (ref 4.0–10.5)

## 2015-03-25 MED ORDER — ONDANSETRON HCL 4 MG/2ML IJ SOLN
4.0000 mg | Freq: Once | INTRAMUSCULAR | Status: AC
  Administered 2015-03-25: 4 mg via INTRAVENOUS
  Filled 2015-03-25: qty 2

## 2015-03-25 MED ORDER — METRONIDAZOLE 500 MG PO TABS
500.0000 mg | ORAL_TABLET | Freq: Once | ORAL | Status: AC
  Administered 2015-03-25: 500 mg via ORAL
  Filled 2015-03-25: qty 1

## 2015-03-25 MED ORDER — CIPROFLOXACIN HCL 500 MG PO TABS: 500.0000 mg | ORAL_TABLET | Freq: Two times a day (BID) | ORAL | Status: DC

## 2015-03-25 MED ORDER — CIPROFLOXACIN HCL 500 MG PO TABS
500.0000 mg | ORAL_TABLET | Freq: Once | ORAL | Status: AC
  Administered 2015-03-25: 500 mg via ORAL
  Filled 2015-03-25: qty 1

## 2015-03-25 MED ORDER — IOHEXOL 300 MG/ML  SOLN
50.0000 mL | INTRAMUSCULAR | Status: AC
  Administered 2015-03-25: 50 mL via ORAL

## 2015-03-25 MED ORDER — METRONIDAZOLE 500 MG PO TABS: 500.0000 mg | ORAL_TABLET | Freq: Two times a day (BID) | ORAL | Status: DC

## 2015-03-25 MED ORDER — SODIUM CHLORIDE 0.9 % IV BOLUS (SEPSIS)
500.0000 mL | Freq: Once | INTRAVENOUS | Status: AC
  Administered 2015-03-25: 500 mL via INTRAVENOUS

## 2015-03-25 NOTE — ED Notes (Signed)
Per EMS pt from home, lives with daughter, c/o generalized weakness x 1 day, loss of appetite and one episode of N/V earlier today. A&O to baseline. Hx of dementia.

## 2015-03-25 NOTE — ED Notes (Signed)
Bed: OZ30 Expected date:  Expected time:  Means of arrival:  Comments: EMS- 81yo, generalized weakness

## 2015-03-25 NOTE — ED Provider Notes (Signed)
CSN: [id number]     Arrival date & time 03/25/15  1434 History   First MD Initiated Contact with Patient 03/25/15 1452     Chief Complaint  Patient presents with  . Weakness     (Consider location/radiation/quality/duration/timing/severity/associated sxs/prior Treatment) HPI Comments: 81 year old female with extensive past medical history including dementia, CVA, CK D, DVT and PE, hypertension who presents with weakness. History obtained from the patient's daughters as the patient is unable to answer questions due to her underlying dementia. Daughter states that for the past several days, the patient has had generalized weakness and poor appetite. Today when she was walking at home she had an episode of vomiting and her daughter lowered her to the floor. She was unable to get up afterwards due to generalized weakness. She has not been complaining of any pain other than her chronic knee pain. They have not noticed any fevers or diarrhea. No sick contacts.  Patient is a 81 y.o. female presenting with weakness. The history is provided by a relative.  Weakness    Past Medical History  Diagnosis Date  . Hypertension   . Bronchitis     "not chronic; not recurrent" (11/13/2013)  . Chronic anemia   . GERD (gastroesophageal reflux disease)   . Pulmonary embolism   . DVT (deep venous thrombosis)     "RLE?"  . Rheumatoid arthritis   . Stroke     11/13/2013 "she has a slow bleed"  . Chronic kidney disease (CKD), stage II (mild)     Stage II, creatine clearance 40-50 cc /minute   Past Surgical History  Procedure Laterality Date  . Replacement total knee bilateral Bilateral   . Joint replacement    . Tubal ligation    . Carpal tunnel release    . Dilation and curettage of uterus    . Cataract extraction w/ intraocular lens  implant, bilateral Bilateral    Family History  Problem Relation Age of Onset  . Hypertension Daughter    Social History  Substance Use Topics  . Smoking status:  Never Smoker   . Smokeless tobacco: Never Used  . Alcohol Use: No   OB History    No data available     Review of Systems  Unable to perform ROS: Dementia  Neurological: Positive for weakness.      Allergies  Aricept  Home Medications   Prior to Admission medications   Medication Sig Start Date End Date Taking? Authorizing Provider  aspirin EC 81 MG tablet Take 1 tablet (81 mg total) by mouth daily. 11/28/13  Yes Evlyn Kanner Love, PA-C  DOCUSATE SODIUM PO Take 1 capsule by mouth daily as needed (constipation.).   Yes Historical Provider, MD  ferrous sulfate 325 (65 FE) MG tablet Take 325 mg by mouth daily with breakfast.   Yes Historical Provider, MD  furosemide (LASIX) 20 MG tablet Take 1 tablet (20 mg total) by mouth daily. 02/08/15  Yes Dione Booze, MD  HYDROcodone-acetaminophen (NORCO/VICODIN) 5-325 MG per tablet Take 1-2 tablets by mouth every 8 (eight) hours as needed. Severe pain. 03/04/15  Yes Historical Provider, MD  magnesium oxide (MAG-OX) 400 (241.3 MG) MG tablet Take 1 tablet (400 mg total) by mouth daily. 12/14/13  Yes Christiane Ha, MD  metoprolol tartrate (LOPRESSOR) 25 MG tablet Take 1 tablet (25 mg total) by mouth 2 (two) times daily. 12/14/13  Yes Christiane Ha, MD  mirtazapine (REMERON) 15 MG tablet Take 15 mg by mouth at bedtime.  Yes Historical Provider, MD  pantoprazole (PROTONIX) 40 MG tablet Take 40 mg by mouth daily after breakfast.    Yes Historical Provider, MD  predniSONE (DELTASONE) 5 MG tablet Take 5 mg by mouth every morning.    Yes Historical Provider, MD  ciprofloxacin (CIPRO) 500 MG tablet Take 1 tablet (500 mg total) by mouth 2 (two) times daily. 03/25/15   Laurence Spates, MD  metroNIDAZOLE (FLAGYL) 500 MG tablet Take 1 tablet (500 mg total) by mouth 2 (two) times daily. 03/25/15   Laurence Spates, MD  senna-docusate (SENOKOT-S) 8.6-50 MG per tablet Take 1 tablet by mouth 2 (two) times daily. Available over the counter Patient not  taking: Reported on 03/25/2015 11/28/13   Evlyn Kanner Love, PA-C   BP 111/77 mmHg  Pulse 84  Temp(Src) 98 F (36.7 C) (Oral)  Resp 20  SpO2 95% Physical Exam  Constitutional:  Awake, alert, frail but in no acute distress  HENT:  Head: Normocephalic and atraumatic.  Dry mucous membranes  Eyes: Conjunctivae are normal. Pupils are equal, round, and reactive to light.  Neck: Neck supple.  Cardiovascular: Normal rate, regular rhythm, normal heart sounds and intact distal pulses.   No murmur heard. Pulmonary/Chest: Effort normal and breath sounds normal.  Abdominal: Soft. Bowel sounds are normal. She exhibits no distension.  Tenderness to palpation of the left lower quadrant with mild voluntary guarding, no rebound, no peritonitis  Musculoskeletal:  Trace bilateral lower extremity edema  Neurological: She is alert.  Moving all 4 extremities, able to follow basic commands  Skin: Skin is warm.  Dry, flaking skin  Psychiatric:  calm  Nursing note and vitals reviewed.   ED Course  Procedures (including critical care time) Labs Review Labs Reviewed  COMPREHENSIVE METABOLIC PANEL - Abnormal; Notable for the following:    Chloride 100 (*)    Glucose, Bld 108 (*)    BUN 24 (*)    Creatinine, Ser 1.67 (*)    Total Protein 8.3 (*)    Albumin 3.2 (*)    ALT 12 (*)    GFR calc non Af Amer 25 (*)    GFR calc Af Amer 29 (*)    All other components within normal limits  CBC WITH DIFFERENTIAL/PLATELET - Abnormal; Notable for the following:    WBC 12.6 (*)    Hemoglobin 10.1 (*)    HCT 30.6 (*)    MCV 72.3 (*)    MCH 23.9 (*)    Neutro Abs 10.4 (*)    All other components within normal limits  URINE CULTURE  URINALYSIS, ROUTINE W REFLEX MICROSCOPIC (NOT AT [facility])    Imaging Review Ct Abdomen Pelvis Wo Contrast  03/25/2015   CLINICAL DATA:  Generalized weakness for 1 day with nausea and vomiting  EXAM: CT ABDOMEN AND PELVIS WITHOUT CONTRAST  TECHNIQUE: Multidetector CT imaging of the  abdomen and pelvis was performed following the standard protocol without IV contrast.  COMPARISON:  10/22/2012  FINDINGS: Lung bases demonstrate mild bibasilar atelectatic changes.  The gallbladder, spleen, adrenal glands and pancreas are within normal limits. The gallstones seen on prior ultrasound are not as well appreciated on this examination. The kidneys demonstrate mild cystic change bilaterally. Liver demonstrates a tiny hypodensity on image number 20 of series 2 within the right lobe likely representing a small cyst but incompletely characterized on this exam.  The bladder is well distended. The appendix is within normal limits. In the sigmoid colon there is eccentric wall thickening and  pericolonic inflammatory change. This may simply be related to diverticulitis however followup evaluation following appropriate therapy is recommended to rule out an underlying mass lesion. Calcified uterine fibroids are noted. No acute bony abnormality is seen.  IMPRESSION: Changes consistent with diverticulitis of the descending and sigmoid colon. No perforation is identified. Followup workup may be helpful following appropriate therapy to rule out an underlying mass lesion.  No other focal abnormality is noted.   Electronically Signed   By: Alcide Clever M.D.   On: 03/25/2015 18:46   Dg Chest 2 View  03/25/2015   CLINICAL DATA:  Weakness, vomiting.  EXAM: CHEST  2 VIEW  COMPARISON:  02/08/2015  FINDINGS: Chronic increased markings throughout the left lung. Increasing opacity at the right lung base could reflect atelectasis or infiltrate. Underlying COPD. Heart is mildly enlarged. No effusions. No acute bony abnormality.  IMPRESSION: COPD. Stable chronic interstitial prominence throughout the left lung.  Increasing right basilar atelectasis or infiltrate.   Electronically Signed   By: Charlett Nose M.D.   On: 03/25/2015 16:18   I have personally reviewed and evaluated these lab results as part of my medical  decision-making.   EKG Interpretation None     Medications  iohexol (OMNIPAQUE) 300 MG/ML solution 50 mL (50 mLs Oral Contrast Given 03/25/15 1648)  sodium chloride 0.9 % bolus 500 mL (0 mLs Intravenous Stopped 03/25/15 1923)  ondansetron (ZOFRAN) injection 4 mg (4 mg Intravenous Given 03/25/15 1559)  metroNIDAZOLE (FLAGYL) tablet 500 mg (500 mg Oral Given 03/25/15 1924)  ciprofloxacin (CIPRO) tablet 500 mg (500 mg Oral Given 03/25/15 1924)    MDM   Final diagnoses:  Diverticulitis of large intestine without perforation or abscess without bleeding    81 year old female with past medical history above including dementia who presents with several days of worsening generalized weakness associated with decreased appetite and one episode of vomiting. On arrival, the patient was awake, alert, and in no acute distress. Vital signs reassuring. Left lower quadrant tenderness on exam. Obtained above lab work to evaluate for evidence of infection as well as chest x-ray. Gave pt IVF bolus and zofran.  Labwork notable for WBC 12.6, creatinine slightly elevated at 1.67. UA unremarkable. Because of the patient's left-sided abdominal tenderness, obtained a CT of the abdomen and pelvis to evaluate for acute intra-abdominal process. CT showed uncomplicated diverticulitis without evidence of perforation. Gave the patient oral ciprofloxacin and Flagyl after which she was able to drink water and eat crackers. I discussed the findings with the patient's daughter and offered shared decision making in regards to admission versus discharge home. Daughter voiced understanding of treatment plan with oral antibiotics and continued hydration and felt comfortable managing diverticulitis at home. Given the patient's advanced age and risk for nosocomial infections, I feel that outpatient treatment is appropriate. She remains comfortable on exam and is tolerating by mouth. Return precautions reviewed including vomiting, bloody  stool, fevers, or worsening symptoms. Daughter voiced understanding and will take patient to PCP this week for reevaluation. Patient discharged in satisfactory condition.  Laurence Spates, MD 03/25/15 2103

## 2015-03-25 NOTE — Discharge Instructions (Signed)
Diverticulitis °Diverticulitis is when small pockets that have formed in your colon (large intestine) become infected or swollen. °HOME CARE °· Follow your doctor's instructions. °· Follow a special diet if told by your doctor. °· When you feel better, your doctor may tell you to change your diet. You may be told to eat a lot of fiber. Fruits and vegetables are good sources of fiber. Fiber makes it easier to poop (have bowel movements). °· Take supplements or probiotics as told by your doctor. °· Only take medicines as told by your doctor. °· Keep all follow-up visits with your doctor. °GET HELP IF: °· Your pain does not get better. °· You have a hard time eating food. °· You are not pooping like normal. °GET HELP RIGHT AWAY IF: °· Your pain gets worse. °· Your problems do not get better. °· Your problems suddenly get worse. °· You have a fever. °· You keep throwing up (vomiting). °· You have bloody or black, tarry poop (stool). °MAKE SURE YOU:  °· Understand these instructions. °· Will watch your condition. °· Will get help right away if you are not doing well or get worse. °Document Released: 12/09/2007 Document Revised: 06/27/2013 Document Reviewed: 05/17/2013 °[employer]® Patient Information ©2015 [employer]. This information is not intended to replace advice given to you by your health care provider. Make sure you discuss any questions you have with your health care provider. ° °

## 2015-03-26 LAB — URINE CULTURE: Culture: NO GROWTH

## 2015-03-28 ENCOUNTER — Ambulatory Visit: Payer: Medicare Other | Admitting: Physician Assistant

## 2015-04-01 ENCOUNTER — Emergency Department (HOSPITAL_COMMUNITY): Payer: Medicare Other

## 2015-04-01 ENCOUNTER — Emergency Department (HOSPITAL_COMMUNITY)
Admission: EM | Admit: 2015-04-01 | Discharge: 2015-04-01 | Disposition: A | Discharge status: Home / Self Care | Payer: Medicare Other | Attending: Emergency Medicine | Admitting: Emergency Medicine

## 2015-04-01 ENCOUNTER — Encounter (HOSPITAL_COMMUNITY): Payer: Self-pay | Admitting: Emergency Medicine

## 2015-04-01 DIAGNOSIS — K219 Gastro-esophageal reflux disease without esophagitis: Secondary | ICD-10-CM | POA: Insufficient documentation

## 2015-04-01 DIAGNOSIS — M069 Rheumatoid arthritis, unspecified: Secondary | ICD-10-CM | POA: Insufficient documentation

## 2015-04-01 DIAGNOSIS — Z8709 Personal history of other diseases of the respiratory system: Secondary | ICD-10-CM | POA: Diagnosis not present

## 2015-04-01 DIAGNOSIS — F0390 Unspecified dementia without behavioral disturbance: Secondary | ICD-10-CM | POA: Insufficient documentation

## 2015-04-01 DIAGNOSIS — D649 Anemia, unspecified: Secondary | ICD-10-CM | POA: Diagnosis not present

## 2015-04-01 DIAGNOSIS — Z792 Long term (current) use of antibiotics: Secondary | ICD-10-CM | POA: Insufficient documentation

## 2015-04-01 DIAGNOSIS — Z86718 Personal history of other venous thrombosis and embolism: Secondary | ICD-10-CM | POA: Diagnosis not present

## 2015-04-01 DIAGNOSIS — N182 Chronic kidney disease, stage 2 (mild): Secondary | ICD-10-CM | POA: Diagnosis not present

## 2015-04-01 DIAGNOSIS — R1110 Vomiting, unspecified: Secondary | ICD-10-CM | POA: Diagnosis not present

## 2015-04-01 DIAGNOSIS — I129 Hypertensive chronic kidney disease with stage 1 through stage 4 chronic kidney disease, or unspecified chronic kidney disease: Secondary | ICD-10-CM | POA: Insufficient documentation

## 2015-04-01 DIAGNOSIS — Z86711 Personal history of pulmonary embolism: Secondary | ICD-10-CM | POA: Insufficient documentation

## 2015-04-01 DIAGNOSIS — Z8673 Personal history of transient ischemic attack (TIA), and cerebral infarction without residual deficits: Secondary | ICD-10-CM | POA: Diagnosis not present

## 2015-04-01 DIAGNOSIS — Z7982 Long term (current) use of aspirin: Secondary | ICD-10-CM | POA: Diagnosis not present

## 2015-04-01 DIAGNOSIS — Z8719 Personal history of other diseases of the digestive system: Secondary | ICD-10-CM

## 2015-04-01 DIAGNOSIS — Z79899 Other long term (current) drug therapy: Secondary | ICD-10-CM | POA: Diagnosis not present

## 2015-04-01 DIAGNOSIS — R531 Weakness: Secondary | ICD-10-CM | POA: Diagnosis present

## 2015-04-01 LAB — CBC WITH DIFFERENTIAL/PLATELET
Basophils Absolute: 0 10*3/uL (ref 0.0–0.1)
Basophils Relative: 0 %
Eosinophils Absolute: 0.3 10*3/uL (ref 0.0–0.7)
Eosinophils Relative: 3 %
HCT: 30.6 % — ABNORMAL LOW (ref 36.0–46.0)
Hemoglobin: 10.2 g/dL — ABNORMAL LOW (ref 12.0–15.0)
Lymphocytes Relative: 19 %
Lymphs Abs: 1.8 10*3/uL (ref 0.7–4.0)
MCH: 23.8 pg — ABNORMAL LOW (ref 26.0–34.0)
MCHC: 33.3 g/dL (ref 30.0–36.0)
MCV: 71.5 fL — ABNORMAL LOW (ref 78.0–100.0)
Monocytes Absolute: 1.4 10*3/uL — ABNORMAL HIGH (ref 0.1–1.0)
Monocytes Relative: 15 %
Neutro Abs: 6.3 10*3/uL (ref 1.7–7.7)
Neutrophils Relative %: 63 %
Platelets: 330 10*3/uL (ref 150–400)
RBC: 4.28 MIL/uL (ref 3.87–5.11)
RDW: 15.4 % (ref 11.5–15.5)
WBC: 9.8 10*3/uL (ref 4.0–10.5)

## 2015-04-01 LAB — URINALYSIS, ROUTINE W REFLEX MICROSCOPIC
Bilirubin Urine: NEGATIVE
Glucose, UA: NEGATIVE mg/dL
Hgb urine dipstick: NEGATIVE
Ketones, ur: NEGATIVE mg/dL
Leukocytes, UA: NEGATIVE
Nitrite: NEGATIVE
Protein, ur: NEGATIVE mg/dL
Specific Gravity, Urine: 1.012 (ref 1.005–1.030)
Urobilinogen, UA: 0.2 mg/dL (ref 0.0–1.0)
pH: 5.5 (ref 5.0–8.0)

## 2015-04-01 LAB — I-STAT CG4 LACTIC ACID, ED: Lactic Acid, Venous: 2.15 mmol/L (ref 0.5–2.0)

## 2015-04-01 LAB — COMPREHENSIVE METABOLIC PANEL
ALT: 7 U/L — ABNORMAL LOW (ref 14–54)
AST: 21 U/L (ref 15–41)
Albumin: 3.3 g/dL — ABNORMAL LOW (ref 3.5–5.0)
Alkaline Phosphatase: 42 U/L (ref 38–126)
Anion gap: 8 (ref 5–15)
BUN: 20 mg/dL (ref 6–20)
CO2: 24 mmol/L (ref 22–32)
Calcium: 9.1 mg/dL (ref 8.9–10.3)
Chloride: 100 mmol/L — ABNORMAL LOW (ref 101–111)
Creatinine, Ser: 1.78 mg/dL — ABNORMAL HIGH (ref 0.44–1.00)
GFR calc Af Amer: 27 mL/min — ABNORMAL LOW (ref >60)
GFR calc non Af Amer: 23 mL/min — ABNORMAL LOW (ref >60)
Glucose, Bld: 121 mg/dL — ABNORMAL HIGH (ref 65–99)
Potassium: 4.3 mmol/L (ref 3.5–5.1)
Sodium: 132 mmol/L — ABNORMAL LOW (ref 135–145)
Total Bilirubin: 0.4 mg/dL (ref 0.3–1.2)
Total Protein: 8 g/dL (ref 6.5–8.1)

## 2015-04-01 LAB — LIPASE, BLOOD: Lipase: 38 U/L (ref 22–51)

## 2015-04-01 MED ORDER — ONDANSETRON HCL 4 MG/2ML IJ SOLN: 4.0000 mg | Freq: Once | INTRAMUSCULAR | Status: DC

## 2015-04-01 MED ORDER — SODIUM CHLORIDE 0.9 % IV BOLUS (SEPSIS): 500.0000 mL | Freq: Once | INTRAVENOUS | Status: DC

## 2015-04-01 MED ORDER — SODIUM CHLORIDE 0.9 % IV BOLUS (SEPSIS)
500.0000 mL | Freq: Once | INTRAVENOUS | Status: AC
  Administered 2015-04-01: 500 mL via INTRAVENOUS

## 2015-04-01 NOTE — Discharge Instructions (Signed)
Continue your ciprofloxacin and Flagyl. Be sure to drink plenty of clear liquids to prevent dehydration. You may advance your diet to include solid foods when you are tolerating liquids well. Avoid high-fiber foods. Follow-up with your primary care doctor.  Diverticulitis Diverticulitis is inflammation or infection of small pouches in your colon that form when you have a condition called diverticulosis. The pouches in your colon are called diverticula. Your colon, or large intestine, is where water is absorbed and stool is formed. Complications of diverticulitis can include:  Bleeding.  Severe infection.  Severe pain.  Perforation of your colon.  Obstruction of your colon. CAUSES  Diverticulitis is caused by bacteria. Diverticulitis happens when stool becomes trapped in diverticula. This allows bacteria to grow in the diverticula, which can lead to inflammation and infection. RISK FACTORS People with diverticulosis are at risk for diverticulitis. Eating a diet that does not include enough fiber from fruits and vegetables may make diverticulitis more likely to develop. SYMPTOMS  Symptoms of diverticulitis may include:  Abdominal pain and tenderness. The pain is normally located on the left side of the abdomen, but may occur in other areas.  Fever and chills.  Bloating.  Cramping.  Nausea.  Vomiting.  Constipation.  Diarrhea.  Blood in your stool. DIAGNOSIS  Your health care provider will ask you about your medical history and do a physical exam. You may need to have tests done because many medical conditions can cause the same symptoms as diverticulitis. Tests may include:  Blood tests.  Urine tests.  Imaging tests of the abdomen, including X-rays and CT scans. When your condition is under control, your health care provider may recommend that you have a colonoscopy. A colonoscopy can show how severe your diverticula are and whether something else is causing your  symptoms. TREATMENT  Most cases of diverticulitis are mild and can be treated at home. Treatment may include:  Taking over-the-counter pain medicines.  Following a clear liquid diet.  Taking antibiotic medicines by mouth for 7-10 days. More severe cases may be treated at a hospital. Treatment may include:  Not eating or drinking.  Taking prescription pain medicine.  Receiving antibiotic medicines through an IV tube.  Receiving fluids and nutrition through an IV tube.  Surgery. HOME CARE INSTRUCTIONS   Follow your health care provider's instructions carefully.  Follow a full liquid diet or other diet as directed by your health care provider. After your symptoms improve, your health care provider may tell you to change your diet. He or she may recommend you eat a high-fiber diet. Fruits and vegetables are good sources of fiber. Fiber makes it easier to pass stool.  Take fiber supplements or probiotics as directed by your health care provider.  Only take medicines as directed by your health care provider.  Keep all your follow-up appointments. SEEK MEDICAL CARE IF:   Your pain does not improve.  You have a hard time eating food.  Your bowel movements do not return to normal. SEEK IMMEDIATE MEDICAL CARE IF:   Your pain becomes worse.  Your symptoms do not get better.  Your symptoms suddenly get worse.  You have a fever.  You have repeated vomiting.  You have bloody or black, tarry stools. MAKE SURE YOU:   Understand these instructions.  Will watch your condition.  Will get help right away if you are not doing well or get worse. Document Released: 04/01/2005 Document Revised: 06/27/2013 Document Reviewed: 05/17/2013 [facility] Patient Information 2015 [place], [place]. This information  is not intended to replace advice given to you by your health care provider. Make sure you discuss any questions you have with your health care provider.  Low-Fiber Diet Fiber is  found in fruits, vegetables, and whole grains. A low-fiber diet restricts fibrous foods that are not digested in the small intestine. A diet containing about 10-15 grams of fiber per day is considered low fiber. Low-fiber diets may be used to:  Promote healing and rest the bowel during intestinal flare-ups.  Prevent blockage of a partially obstructed or narrowed gastrointestinal tract.  Reduce fecal weight and volume.  Slow the movement of feces. You may be on a low-fiber diet as a transitional diet following surgery, after an injury (trauma), or because of a short (acute) or lifelong (chronic) illness. Your health care provider will determine the length of time you need to stay on this diet.  WHAT DO I NEED TO KNOW ABOUT A LOW-FIBER DIET? Always check the fiber content on the packaging's Nutrition Facts label, especially on foods from the grains list. Ask your dietitian if you have questions about specific foods that are related to your condition, especially if the food is not listed below. In general, a low-fiber food will have less than 2 g of fiber. WHAT FOODS CAN I EAT? Grains All breads and crackers made with white flour. Sweet rolls, doughnuts, waffles, pancakes, [place] toast, bagels. Pretzels, Melba toast, zwieback. Well-cooked cereals, such as cornmeal, farina, or cream cereals. Dry cereals that do not contain whole grains, fruit, or nuts, such as refined corn, wheat, rice, and oat cereals. Potatoes prepared any way without skins, plain pastas and noodles, refined white rice. Use white flour for baking and making sauces. Use allowed list of grains for casseroles, dumplings, and puddings.  Vegetables Strained tomato and vegetable juices. Fresh lettuce, cucumber, spinach. Well-cooked (no skin or pulp) or canned vegetables, such as asparagus, bean sprouts, beets, carrots, green beans, mushrooms, potatoes, pumpkin, spinach, yellow squash, tomato sauce/puree, turnips, yams, and zucchini. Keep  servings limited to  cup.  Fruits All fruit juices except prune juice. Cooked or canned fruits without skin and seeds, such as applesauce, apricots, cherries, fruit cocktail, grapefruit, grapes, mandarin oranges, melons, peaches, pears, pineapple, and plums. Fresh fruits without skin, such as apricots, avocados, bananas, melons, pineapple, nectarines, and peaches. Keep servings limited to  cup or 1 piece.  Meat and Other Protein Sources Ground or well-cooked tender beef, ham, veal, lamb, pork, or poultry. Eggs, plain cheese. Fish, oysters, shrimp, lobster, and other seafood. Liver, organ meats. Smooth nut butters. Dairy All milk products and alternative dairy substitutes, such as soy, rice, almond, and coconut, not containing added whole nuts, seeds, or added fruit. Beverages Decaf coffee, fruit, and vegetable juices or smoothies (small amounts, with no pulp or skins, and with fruits from allowed list), sports drinks, herbal tea. Condiments Ketchup, mustard, vinegar, cream sauce, cheese sauce, cocoa powder. Spices in moderation, such as allspice, basil, bay leaves, celery powder or leaves, cinnamon, cumin powder, curry powder, ginger, mace, marjoram, onion or garlic powder, oregano, paprika, parsley flakes, ground pepper, rosemary, sage, savory, tarragon, thyme, and turmeric. Sweets and Desserts Plain cakes and cookies, pie made with allowed fruit, pudding, custard, cream pie. Gelatin, fruit, ice, sherbet, frozen ice pops. Ice cream, ice milk without nuts. Plain hard candy, honey, jelly, molasses, syrup, sugar, chocolate syrup, gumdrops, marshmallows. Limit overall sugar intake.  Fats and Oil Margarine, butter, cream, mayonnaise, salad oils, plain salad dressings made from allowed foods. Choose  healthy fats such as olive oil, canola oil, and omega-3 fatty acids (such as found in salmon or tuna) when possible.  Other Bouillon, broth, or cream soups made from allowed foods. Any strained soup.  Casseroles or mixed dishes made with allowed foods. The items listed above may not be a complete list of recommended foods or beverages. Contact your dietitian for more options.  WHAT FOODS ARE NOT RECOMMENDED? Grains All whole wheat and whole grain breads and crackers. Multigrains, rye, bran seeds, nuts, or coconut. Cereals containing whole grains, multigrains, bran, coconut, nuts, raisins. Cooked or dry oatmeal, steel-cut oats. Coarse wheat cereals, granola. Cereals advertised as high fiber. Potato skins. Whole grain pasta, wild or brown rice. Popcorn. Coconut flour. Bran, buckwheat, corn bread, multigrains, rye, wheat germ.  Vegetables Fresh, cooked or canned vegetables, such as artichokes, asparagus, beet greens, broccoli, Brussels sprouts, cabbage, celery, cauliflower, corn, eggplant, kale, legumes or beans, okra, peas, and tomatoes. Avoid large servings of any vegetables, especially raw vegetables.  Fruits Fresh fruits, such as apples with or without skin, berries, cherries, figs, grapes, grapefruit, guavas, kiwis, mangoes, oranges, papayas, pears, persimmons, pineapple, and pomegranate. Prune juice and juices with pulp, stewed or dried prunes. Dried fruits, dates, raisins. Fruit seeds or skins. Avoid large servings of all fresh fruits. Meats and Other Protein Sources Tough, fibrous meats with gristle. Chunky nut butter. Cheese made with seeds, nuts, or other foods not recommended. Nuts, seeds, legumes (beans, including baked beans), dried peas, beans, lentils.  Dairy Yogurt or cheese that contains nuts, seeds, or added fruit.  Beverages Fruit juices with high pulp, prune juice. Caffeinated coffee and teas.  Condiments Coconut, maple syrup, pickles, olives. Sweets and Desserts Desserts, cookies, or candies that contain nuts or coconut, chunky peanut butter, dried fruits. Jams, preserves with seeds, marmalade. Large amounts of sugar and sweets. Any other dessert made with fruits from the not  recommended list.  Other Soups made from vegetables that are not recommended or that contain other foods not recommended.  The items listed above may not be a complete list of foods and beverages to avoid. Contact your dietitian for more information. Document Released: 12/12/2001 Document Revised: 06/27/2013 Document Reviewed: 05/15/2013 [facility] Patient Information 2015 [place], [place]. This information is not intended to replace advice given to you by your health care provider. Make sure you discuss any questions you have with your health care provider.

## 2015-04-01 NOTE — ED Notes (Signed)
Pt able to ambulate with walker. PA aware. AVS explained in detail. Knows to follow up with PCP. No other c/c.

## 2015-04-01 NOTE — ED Notes (Signed)
Bed: KX38 Expected date:  Expected time:  Means of arrival:  Comments: EMS- 81yo F, BLE weakness, n/v

## 2015-04-01 NOTE — ED Provider Notes (Signed)
CSN: [id number]     Arrival date & time 04/01/15  1503 History   First MD Initiated Contact with Patient 04/01/15 1508     Chief Complaint  Patient presents with  . Extremity Weakness  . Diverticulitis    LEVEL 5 CAVEAT APPLIES SECONDARY TO DEMENTIA  (Consider location/radiation/quality/duration/timing/severity/associated sxs/prior Treatment) HPI Comments: 81 year old female with a history of hypertension, esophageal reflux, pulmonary embolism and DVT, CVA, CKD, and dementia presents to the ED for evaluation of weakness. Patient unable to contribute to history secondary to dementia. Patient does say that both of her legs "gave out" on her this morning and that they felt weak. EMS report 1 episode of emesis this AM. Patient diagnosed with diverticulitis on 03/25/15 when seen in the ED.  Patient is a 81 y.o. female presenting with extremity weakness.  Extremity Weakness Associated symptoms include vomiting and weakness.    Past Medical History  Diagnosis Date  . Hypertension   . Bronchitis     "not chronic; not recurrent" (11/13/2013)  . Chronic anemia   . GERD (gastroesophageal reflux disease)   . Pulmonary embolism   . DVT (deep venous thrombosis)     "RLE?"  . Rheumatoid arthritis   . Stroke     11/13/2013 "she has a slow bleed"  . Chronic kidney disease (CKD), stage II (mild)     Stage II, creatine clearance 40-50 cc /minute   Past Surgical History  Procedure Laterality Date  . Replacement total knee bilateral Bilateral   . Joint replacement    . Tubal ligation    . Carpal tunnel release    . Dilation and curettage of uterus    . Cataract extraction w/ intraocular lens  implant, bilateral Bilateral    Family History  Problem Relation Age of Onset  . Hypertension Daughter    Social History  Substance Use Topics  . Smoking status: Never Smoker   . Smokeless tobacco: Never Used  . Alcohol Use: No   OB History    No data available      Review of Systems  Unable  to perform ROS: Dementia  Gastrointestinal: Positive for vomiting.  Musculoskeletal: Positive for extremity weakness.  Neurological: Positive for weakness.    Allergies  Aricept  Home Medications   Prior to Admission medications   Medication Sig Start Date End Date Taking? Authorizing Provider  aspirin EC 81 MG tablet Take 1 tablet (81 mg total) by mouth daily. 11/28/13  Yes Evlyn Kanner Love, PA-C  ciprofloxacin (CIPRO) 500 MG tablet Take 1 tablet (500 mg total) by mouth 2 (two) times daily. 03/25/15  Yes Ambrose Finland Little, MD  DOCUSATE SODIUM PO Take 1 capsule by mouth daily as needed (constipation.).   Yes Historical Provider, MD  ferrous sulfate 325 (65 FE) MG tablet Take 325 mg by mouth daily with breakfast.   Yes Historical Provider, MD  furosemide (LASIX) 20 MG tablet Take 1 tablet (20 mg total) by mouth daily. 02/08/15  Yes Dione Booze, MD  HYDROcodone-acetaminophen (NORCO/VICODIN) 5-325 MG per tablet Take 1-2 tablets by mouth every 8 (eight) hours as needed. Severe pain. 03/04/15  Yes Historical Provider, MD  magnesium oxide (MAG-OX) 400 (241.3 MG) MG tablet Take 1 tablet (400 mg total) by mouth daily. 12/14/13  Yes Christiane Ha, MD  metoprolol tartrate (LOPRESSOR) 25 MG tablet Take 1 tablet (25 mg total) by mouth 2 (two) times daily. 12/14/13  Yes Christiane Ha, MD  metroNIDAZOLE (FLAGYL) 500 MG tablet Take 1  tablet (500 mg total) by mouth 2 (two) times daily. 03/25/15  Yes Laurence Spates, MD  mirtazapine (REMERON) 15 MG tablet Take 15 mg by mouth at bedtime.   Yes Historical Provider, MD  pantoprazole (PROTONIX) 40 MG tablet Take 40 mg by mouth daily after breakfast.    Yes Historical Provider, MD  predniSONE (DELTASONE) 5 MG tablet Take 5 mg by mouth every morning.    Yes Historical Provider, MD  senna-docusate (SENOKOT-S) 8.6-50 MG per tablet Take 1 tablet by mouth 2 (two) times daily. Available over the counter Patient not taking: Reported on 03/25/2015 11/28/13   Evlyn Kanner Love, PA-C   BP 114/65 mmHg  Pulse 92  Temp(Src) 98.3 F (36.8 C) (Oral)  Resp 18  SpO2 98%   Physical Exam  Constitutional: She appears well-developed and well-nourished. No distress.  Nontoxic/nonseptic appearing  HENT:  Head: Normocephalic and atraumatic.  Mouth/Throat: Oropharynx is clear and moist. No oropharyngeal exudate.  Symmetric rise of the uvula with phonation  Eyes: Conjunctivae and EOM are normal. Pupils are equal, round, and reactive to light. No scleral icterus.  73mm b/l; reactive  Neck: Normal range of motion.  Normal ROM  Cardiovascular: Normal rate.   Irregularly irregular rhythm  Pulmonary/Chest: Effort normal and breath sounds normal. No respiratory distress. She has no wheezes. She has no rales.  CTAB. Chest expansion symmetric.  Abdominal: Soft. She exhibits no distension. There is tenderness. There is no rebound and no guarding.  Soft abdomen. No involuntary guarding. Focal TTP noted in the LLQ, mild.  Musculoskeletal: Normal range of motion.  Neurological: She is alert.  Alert and following commands. Speech is goal oriented. No facial drooping. Patient moving all extremities. Slight decrease in strength against resistance noted in the LUE.  Skin: Skin is warm and dry. No rash noted. She is not diaphoretic. No erythema. No pallor.  Psychiatric: She has a normal mood and affect. Her behavior is normal.  Nursing note and vitals reviewed.   ED Course  Procedures (including critical care time) Labs Review Labs Reviewed  CBC WITH DIFFERENTIAL/PLATELET - Abnormal; Notable for the following:    Hemoglobin 10.2 (*)    HCT 30.6 (*)    MCV 71.5 (*)    MCH 23.8 (*)    Monocytes Absolute 1.4 (*)    All other components within normal limits  COMPREHENSIVE METABOLIC PANEL - Abnormal; Notable for the following:    Sodium 132 (*)    Chloride 100 (*)    Glucose, Bld 121 (*)    Creatinine, Ser 1.78 (*)    Albumin 3.3 (*)    ALT 7 (*)    GFR calc non Af Amer  23 (*)    GFR calc Af Amer 27 (*)    All other components within normal limits  URINALYSIS, ROUTINE W REFLEX MICROSCOPIC (NOT AT [facility]) - Abnormal; Notable for the following:    Color, Urine AMBER (*)    All other components within normal limits  I-STAT CG4 LACTIC ACID, ED - Abnormal; Notable for the following:    Lactic Acid, Venous 2.15 (*)    All other components within normal limits  LIPASE, BLOOD    Imaging Review Ct Head Wo Contrast  04/01/2015   CLINICAL DATA:  Vomiting this morning, BILATERAL leg weakness, dementia, hypertension, stroke, diagnosed with diverticulitis 1 week ago  EXAM: CT HEAD WITHOUT CONTRAST  TECHNIQUE: Contiguous axial images were obtained from the base of the skull through the vertex without intravenous  contrast.  COMPARISON:  12/10/2013  FINDINGS: Generalized atrophy.  Normal ventricular morphology.  No midline shift or mass effect.  Small vessel chronic ischemic changes of deep cerebral white matter.  Benign-appearing basal ganglia calcifications bilaterally.  No intracranial hemorrhage, mass lesion, or acute infarction.  No extra-axial fluid collections.  Visualized paranasal sinuses and mastoid air cells clear.  Bones unremarkable.  Atherosclerotic calcifications at skullbase.  IMPRESSION: Atrophy with small vessel chronic ischemic changes of deep cerebral white matter.  No acute intracranial abnormalities.   Electronically Signed   By: Ulyses Southward M.D.   On: 04/01/2015 16:48   Dg Abd 2 Views  04/01/2015   CLINICAL DATA:  Vomiting this morning, BILATERAL leg weakness, diagnosed with diverticulitis 1 week ago, hypertension, dementia, stroke  EXAM: ABDOMEN - 2 VIEW  COMPARISON:  CT abdomen/pelvis 03/25/2015  FINDINGS: Emphysematous changes at lung bases.  Diffuse osseous demineralization.  BILATERAL hip joint space narrowing.  Chronic compression fracture of L1.  Nonobstructive bowel gas pattern.  Gas in rectum.  No bowel dilatation, bowel wall thickening, or free air.   Probable calcified fibroid in pelvis.  No urinary tract calcification.  IMPRESSION: Normal bowel gas pattern.   Electronically Signed   By: Ulyses Southward M.D.   On: 04/01/2015 16:45     I have personally reviewed and evaluated these images and lab results as part of my medical decision-making.   EKG Interpretation None      1600 - Patient's family at bedside. They report that patient has been compliant with all abx since diagnosis of diverticulitis. She has been eating a bit less since this time. 1 episode of emesis this AM was nonbloody and occurred after patient drank an Ensure. They note the patient was on her way to her PCP office when her legs buckled and gave out. They state that the patient ambulates with a walker at baseline and her legs "buckling" is unusual for her. They do report some baseline left sided weakness; unsure if this is secondary to her arthritis or hx of CVA. Patient with diarrhea still. Daughter noticed this in the AM. Diarrhea has remained nonbloody. MDM   Final diagnoses:  Vomiting  History of diverticulitis    81 year old female presents to the emergency department for further evaluation of bilateral leg weakness. Family states that her legs "buckled" from under her. Patient never fell to the ground. No head trauma or loss of consciousness. Patient with some mild left-sided weakness. Family reports that this is baseline. No other focal deficits appreciated. CT head shows no evidence of hemorrhage, hydrocephalus, mass effect, or CVA. Patient recently diagnosed with diverticulitis. She has an improved white count from her prior visit. She has been compliant with her antibiotics. Abdominal exam is nonsurgical. X-ray shows no evidence of free air to suggest perforation.  Patient is up in the emergency department and ambulatory with a walker which is per her baseline. She is conversant with staff and stating that she wants to go home. Family feels comfortable with discharge  and they state the patient is acting per her baseline. No indication for further emergent workup at this time. Will discharge with instructions for her primary care follow-up. Return precautions discussed and provided. Family agreeable to plan with no unaddressed concerns. Patient discharged in good condition.   Filed Vitals:   04/01/15 1521 04/01/15 1736 04/01/15 1747  BP: 112/49 114/65 114/65  Pulse: 88 72 92  Temp: 98.6 F (37 C)  98.3 F (36.8 C)  TempSrc:  Oral  Oral  Resp: 20 25 18   SpO2: 93% 100% 98%     , PA-C 04/01/15 2226  2227, MD 04/12/15 1651

## 2015-04-01 NOTE — ED Notes (Signed)
Per EMS- pt diagnosed with diverticulitis last week. Emesis x1 this morning. Also c/o bilateral leg weakness. Hx dementia. VSS.

## 2015-04-12 ENCOUNTER — Encounter (HOSPITAL_COMMUNITY): Payer: Self-pay | Admitting: Emergency Medicine

## 2015-04-12 ENCOUNTER — Inpatient Hospital Stay (HOSPITAL_COMMUNITY)
Admission: EM | Admit: 2015-04-12 | Discharge: 2015-04-16 | DRG: 872 | Disposition: A | Discharge status: Home Health | Payer: Medicare Other | Attending: Internal Medicine | Admitting: Internal Medicine

## 2015-04-12 ENCOUNTER — Emergency Department (HOSPITAL_COMMUNITY): Payer: Medicare Other

## 2015-04-12 DIAGNOSIS — Z7982 Long term (current) use of aspirin: Secondary | ICD-10-CM | POA: Diagnosis not present

## 2015-04-12 DIAGNOSIS — Z79899 Other long term (current) drug therapy: Secondary | ICD-10-CM

## 2015-04-12 DIAGNOSIS — A419 Sepsis, unspecified organism: Secondary | ICD-10-CM

## 2015-04-12 DIAGNOSIS — N184 Chronic kidney disease, stage 4 (severe): Secondary | ICD-10-CM | POA: Diagnosis present

## 2015-04-12 DIAGNOSIS — Z66 Do not resuscitate: Secondary | ICD-10-CM | POA: Diagnosis present

## 2015-04-12 DIAGNOSIS — A498 Other bacterial infections of unspecified site: Secondary | ICD-10-CM | POA: Diagnosis not present

## 2015-04-12 DIAGNOSIS — W19XXXA Unspecified fall, initial encounter: Secondary | ICD-10-CM | POA: Diagnosis present

## 2015-04-12 DIAGNOSIS — R531 Weakness: Secondary | ICD-10-CM

## 2015-04-12 DIAGNOSIS — Z7952 Long term (current) use of systemic steroids: Secondary | ICD-10-CM | POA: Diagnosis not present

## 2015-04-12 DIAGNOSIS — Z1612 Extended spectrum beta lactamase (ESBL) resistance: Secondary | ICD-10-CM | POA: Diagnosis present

## 2015-04-12 DIAGNOSIS — F0390 Unspecified dementia without behavioral disturbance: Secondary | ICD-10-CM | POA: Diagnosis present

## 2015-04-12 DIAGNOSIS — Z96653 Presence of artificial knee joint, bilateral: Secondary | ICD-10-CM | POA: Diagnosis present

## 2015-04-12 DIAGNOSIS — A4151 Sepsis due to Escherichia coli [E. coli]: Secondary | ICD-10-CM | POA: Diagnosis present

## 2015-04-12 DIAGNOSIS — Y92009 Unspecified place in unspecified non-institutional (private) residence as the place of occurrence of the external cause: Secondary | ICD-10-CM

## 2015-04-12 DIAGNOSIS — I480 Paroxysmal atrial fibrillation: Secondary | ICD-10-CM | POA: Diagnosis present

## 2015-04-12 DIAGNOSIS — Z79891 Long term (current) use of opiate analgesic: Secondary | ICD-10-CM | POA: Diagnosis not present

## 2015-04-12 DIAGNOSIS — Z90710 Acquired absence of both cervix and uterus: Secondary | ICD-10-CM

## 2015-04-12 DIAGNOSIS — I69354 Hemiplegia and hemiparesis following cerebral infarction affecting left non-dominant side: Secondary | ICD-10-CM | POA: Diagnosis not present

## 2015-04-12 DIAGNOSIS — D509 Iron deficiency anemia, unspecified: Secondary | ICD-10-CM | POA: Diagnosis present

## 2015-04-12 DIAGNOSIS — R0682 Tachypnea, not elsewhere classified: Secondary | ICD-10-CM | POA: Diagnosis present

## 2015-04-12 DIAGNOSIS — M069 Rheumatoid arthritis, unspecified: Secondary | ICD-10-CM | POA: Diagnosis present

## 2015-04-12 DIAGNOSIS — R5383 Other fatigue: Secondary | ICD-10-CM

## 2015-04-12 DIAGNOSIS — N390 Urinary tract infection, site not specified: Secondary | ICD-10-CM | POA: Diagnosis present

## 2015-04-12 DIAGNOSIS — R5381 Other malaise: Secondary | ICD-10-CM

## 2015-04-12 DIAGNOSIS — R296 Repeated falls: Secondary | ICD-10-CM

## 2015-04-12 HISTORY — DX: Unspecified dementia, unspecified severity, without behavioral disturbance, psychotic disturbance, mood disturbance, and anxiety: F03.90

## 2015-04-12 LAB — COMPREHENSIVE METABOLIC PANEL
ALT: 13 U/L — ABNORMAL LOW (ref 14–54)
AST: 33 U/L (ref 15–41)
Albumin: 3.1 g/dL — ABNORMAL LOW (ref 3.5–5.0)
Alkaline Phosphatase: 40 U/L (ref 38–126)
Anion gap: 10 (ref 5–15)
BUN: 23 mg/dL — ABNORMAL HIGH (ref 6–20)
CO2: 24 mmol/L (ref 22–32)
Calcium: 8.9 mg/dL (ref 8.9–10.3)
Chloride: 101 mmol/L (ref 101–111)
Creatinine, Ser: 1.65 mg/dL — ABNORMAL HIGH (ref 0.44–1.00)
GFR calc Af Amer: 30 mL/min — ABNORMAL LOW (ref >60)
GFR calc non Af Amer: 25 mL/min — ABNORMAL LOW (ref >60)
Glucose, Bld: 110 mg/dL — ABNORMAL HIGH (ref 65–99)
Potassium: 4.1 mmol/L (ref 3.5–5.1)
Sodium: 135 mmol/L (ref 135–145)
Total Bilirubin: 1.2 mg/dL (ref 0.3–1.2)
Total Protein: 8.3 g/dL — ABNORMAL HIGH (ref 6.5–8.1)

## 2015-04-12 LAB — URINALYSIS, ROUTINE W REFLEX MICROSCOPIC
Bilirubin Urine: NEGATIVE
Glucose, UA: NEGATIVE mg/dL
Ketones, ur: NEGATIVE mg/dL
Nitrite: NEGATIVE
Protein, ur: 100 mg/dL — AB
Specific Gravity, Urine: 1.015 (ref 1.005–1.030)
Urobilinogen, UA: 0.2 mg/dL (ref 0.0–1.0)
pH: 8 (ref 5.0–8.0)

## 2015-04-12 LAB — URINE MICROSCOPIC-ADD ON

## 2015-04-12 LAB — CBC WITH DIFFERENTIAL/PLATELET
Basophils Absolute: 0 10*3/uL (ref 0.0–0.1)
Basophils Relative: 0 %
Eosinophils Absolute: 0.1 10*3/uL (ref 0.0–0.7)
Eosinophils Relative: 1 %
HCT: 30.1 % — ABNORMAL LOW (ref 36.0–46.0)
Hemoglobin: 10.4 g/dL — ABNORMAL LOW (ref 12.0–15.0)
Lymphocytes Relative: 11 %
Lymphs Abs: 1.4 10*3/uL (ref 0.7–4.0)
MCH: 24.5 pg — ABNORMAL LOW (ref 26.0–34.0)
MCHC: 34.6 g/dL (ref 30.0–36.0)
MCV: 70.8 fL — ABNORMAL LOW (ref 78.0–100.0)
Monocytes Absolute: 1.4 10*3/uL — ABNORMAL HIGH (ref 0.1–1.0)
Monocytes Relative: 11 %
Neutro Abs: 10.2 10*3/uL — ABNORMAL HIGH (ref 1.7–7.7)
Neutrophils Relative %: 77 %
Platelets: 338 10*3/uL (ref 150–400)
RBC: 4.25 MIL/uL (ref 3.87–5.11)
RDW: 16.2 % — ABNORMAL HIGH (ref 11.5–15.5)
WBC: 13.2 10*3/uL — ABNORMAL HIGH (ref 4.0–10.5)

## 2015-04-12 LAB — I-STAT CG4 LACTIC ACID, ED: Lactic Acid, Venous: 1.97 mmol/L (ref 0.5–2.0)

## 2015-04-12 MED ORDER — ACETAMINOPHEN 325 MG PO TABS
650.0000 mg | ORAL_TABLET | Freq: Once | ORAL | Status: AC
  Administered 2015-04-12: 650 mg via ORAL
  Filled 2015-04-12: qty 2

## 2015-04-12 MED ORDER — ENOXAPARIN SODIUM 30 MG/0.3ML ~~LOC~~ SOLN
30.0000 mg | SUBCUTANEOUS | Status: DC
  Administered 2015-04-12 – 2015-04-15 (×4): 30 mg via SUBCUTANEOUS
  Filled 2015-04-12 (×4): qty 0.3

## 2015-04-12 MED ORDER — CEFTRIAXONE SODIUM 1 G IJ SOLR
1.0000 g | Freq: Once | INTRAMUSCULAR | Status: AC
  Administered 2015-04-12: 1 g via INTRAVENOUS
  Filled 2015-04-12: qty 10

## 2015-04-12 MED ORDER — ONDANSETRON HCL 4 MG PO TABS: 4.0000 mg | ORAL_TABLET | Freq: Four times a day (QID) | ORAL | Status: DC | PRN

## 2015-04-12 MED ORDER — METOPROLOL TARTRATE 25 MG PO TABS
25.0000 mg | ORAL_TABLET | Freq: Two times a day (BID) | ORAL | Status: DC
  Administered 2015-04-12 – 2015-04-16 (×8): 25 mg via ORAL
  Filled 2015-04-12 (×8): qty 1

## 2015-04-12 MED ORDER — ENSURE ENLIVE PO LIQD
237.0000 mL | Freq: Two times a day (BID) | ORAL | Status: DC
  Administered 2015-04-13 – 2015-04-16 (×5): 237 mL via ORAL

## 2015-04-12 MED ORDER — ACETAMINOPHEN 650 MG RE SUPP
650.0000 mg | Freq: Four times a day (QID) | RECTAL | Status: DC | PRN
Start: 2015-04-12 — End: 2015-04-16

## 2015-04-12 MED ORDER — PREDNISONE 5 MG PO TABS
5.0000 mg | ORAL_TABLET | Freq: Every day | ORAL | Status: DC
  Administered 2015-04-13 – 2015-04-16 (×4): 5 mg via ORAL
  Filled 2015-04-12 (×4): qty 1

## 2015-04-12 MED ORDER — FERROUS SULFATE 325 (65 FE) MG PO TABS
325.0000 mg | ORAL_TABLET | Freq: Every day | ORAL | Status: DC
  Administered 2015-04-13 – 2015-04-16 (×4): 325 mg via ORAL
  Filled 2015-04-12 (×4): qty 1

## 2015-04-12 MED ORDER — ASPIRIN EC 81 MG PO TBEC
81.0000 mg | DELAYED_RELEASE_TABLET | Freq: Every day | ORAL | Status: DC
  Administered 2015-04-13 – 2015-04-16 (×4): 81 mg via ORAL
  Filled 2015-04-12 (×4): qty 1

## 2015-04-12 MED ORDER — CETYLPYRIDINIUM CHLORIDE 0.05 % MT LIQD
7.0000 mL | Freq: Two times a day (BID) | OROMUCOSAL | Status: DC
  Administered 2015-04-12 – 2015-04-16 (×8): 7 mL via OROMUCOSAL

## 2015-04-12 MED ORDER — SODIUM CHLORIDE 0.9 % IV BOLUS (SEPSIS)
1000.0000 mL | Freq: Once | INTRAVENOUS | Status: AC
  Administered 2015-04-12: 1000 mL via INTRAVENOUS

## 2015-04-12 MED ORDER — SODIUM CHLORIDE 0.9 % IJ SOLN
3.0000 mL | Freq: Two times a day (BID) | INTRAMUSCULAR | Status: DC
  Administered 2015-04-12 – 2015-04-14 (×5): 3 mL via INTRAVENOUS

## 2015-04-12 MED ORDER — DEXTROSE 5 % IV SOLN
1.0000 g | INTRAVENOUS | Status: DC
  Administered 2015-04-13: 1 g via INTRAVENOUS
  Filled 2015-04-12: qty 10

## 2015-04-12 MED ORDER — PANTOPRAZOLE SODIUM 40 MG PO TBEC
40.0000 mg | DELAYED_RELEASE_TABLET | Freq: Every day | ORAL | Status: DC
  Administered 2015-04-13 – 2015-04-16 (×4): 40 mg via ORAL
  Filled 2015-04-12 (×4): qty 1

## 2015-04-12 MED ORDER — ONDANSETRON HCL 4 MG/2ML IJ SOLN: 4.0000 mg | Freq: Four times a day (QID) | INTRAMUSCULAR | Status: DC | PRN

## 2015-04-12 MED ORDER — SODIUM CHLORIDE 0.9 % IV BOLUS (SEPSIS)
1000.0000 mL | Freq: Once | INTRAVENOUS | Status: AC
Start: 2015-04-12 — End: 2015-04-12
  Administered 2015-04-12: 1000 mL via INTRAVENOUS

## 2015-04-12 MED ORDER — ACETAMINOPHEN 325 MG PO TABS
650.0000 mg | ORAL_TABLET | Freq: Four times a day (QID) | ORAL | Status: DC | PRN
  Administered 2015-04-13 – 2015-04-16 (×3): 650 mg via ORAL
  Filled 2015-04-12 (×3): qty 2

## 2015-04-12 MED ORDER — MIRTAZAPINE 15 MG PO TABS
15.0000 mg | ORAL_TABLET | Freq: Every day | ORAL | Status: DC
Start: 2015-04-12 — End: 2015-04-16
  Administered 2015-04-12 – 2015-04-15 (×4): 15 mg via ORAL
  Filled 2015-04-12 (×4): qty 1

## 2015-04-12 NOTE — ED Notes (Signed)
Attempted to change pts wet brief on arrival, took off, but pt has pain with movement. Explained this to family that we will need to obtain xray prior .

## 2015-04-12 NOTE — ED Notes (Signed)
Per EMS: Pt from home.  Unwitnessed fall last night.  Fell on carpeted floor with back up against couch.  Heard but not witnessed.  Moved to bed.  Today, pt was not speaking to family.  Hx of dementia.  Family called 911.  Pt was talking to EMS on arrival.  C/o lower back pain.  Walks with walker.

## 2015-04-12 NOTE — ED Notes (Signed)
Nurse drawing labs. 

## 2015-04-12 NOTE — Progress Notes (Signed)
Cm confirmed with female family member at bedside that pt has medicare and pcp is cammie fulp EPIC updated

## 2015-04-12 NOTE — Clinical Social Work Note (Signed)
Clinical Social Work Assessment  Patient Details  Name: Shelly [name] MRN: [id number] Date of Birth: 04/14/1919  Date of referral:  04/12/15               Reason for consult:   (Fall.)                Permission sought to share information with:   (None.) Permission granted to share information::  No  Name::        Agency::     Relationship::     Contact Information:     Housing/Transportation Living arrangements for the past 2 months:  Single Family Home Source of Information:  Adult Children Patient Interpreter Needed:  None Criminal Activity/Legal Involvement Pertinent to Current Situation/Hospitalization:  No - Comment as needed Significant Relationships:  Adult Children Lives with:  Adult Children Do you feel safe going back to the place where you live?  Yes Need for family participation in patient care:  Yes (Comment) (Daughter states she and sister are primary support.)  Care giving concerns:  There are no care giving concerns at this time. Daughter/Phyllis informed CSW that either she or her sister are always at home with patient. Daughter states that she has someone with her at home 24/7 and that they assist her with ADL's.    Social Worker assessment / plan:  CSW met with patient and daughter at bedside. Daughter confirms that patient presents to [facility] due to fall. Daughter states that the fall happened last night. Per note, pt fell on carpeted floor with back up against couch. Heard but not witnessed. Also, daughter confirms that patient comes from home.  Daughter informed CSW that patient fell last Friday as well. Daughter states that if rehab is needed for patient she would prefer a facility. CSW provided daughter with a list of SNF's.    Daughter informed CSW that patient ambulates with a walker at all times.  Employment status:  Retired Forensic scientist:   Research officer, political party.) PT Recommendations:  Not assessed at this time Information / Referral to community  resources:   (CSW gave daughter a list of SNF.)  Patient/Family's Response to care:  Daughter and patient are accepting at this time. Daughter and patient are aware that she will be admitted.   Patient/Family's Understanding of and Emotional Response to Diagnosis, Current Treatment, and Prognosis:  Patient and daughter are understanding at this time. Both state that they do not have any questions for CSW.   Emotional Assessment Appearance:  Appears stated age Attitude/Demeanor/Rapport:   (Appropriate.) Affect (typically observed):  Accepting, Appropriate Orientation:  Oriented to Self (Per note, patiet has a history of dementia.) Alcohol / Substance use:  Not Applicable Psych involvement (Current and /or in the community):  No (Comment)  Discharge Needs  Concerns to be addressed:  Adjustment to Illness Readmission within the last 30 days:  No Current discharge risk:  None Barriers to Discharge:  No Barriers Identified   Bernita Buffy, LCSW 04/12/2015, 9:16 PM

## 2015-04-12 NOTE — Progress Notes (Signed)
[facility] consulted regarding possible placement.  EDCM spoke to patient's daughter Jamesetta So at bedside.  Patient lives at home with her elderly husband, who requires care as well.  Jamesetta So reports patient's other daughter is with the patient, "all the time" but is currently out of town, returning tomorrow. Patient's grandson also assists in patient's care when needed.  Patient does not have home health services at this time.  EDCM provided patient's daughter with list of home health agencies in [facility], explained services.  EDCM also provided patient's daughter with list of private duty nursing services and informed her it would be an out of pocket expense.  Patient's daughter verbalized understanding.  Patient's daughter expressed interest in short term rehab for patient.  EDCM explained Medicare guidelines to patient's daughter, also informed her that patient can be placed from the community with home health social worker.  Patient has a walker, wheelchair, bedside commode and shower bench at home.  Patient's daughter reports patient uses a walker at home to ambulate and was walking yesterday.  Patient's daughter thankful for services.  Discussed with EDP.  Awaiting urinalysis.  04/12/2015 A. Robi Dewolfe RNCM 1800pm  Patient with elevated WBC, temp of 100.3 earlier in shift and now with temp of 102.2.  Urine positive for UTI. Discussed with EDP. Patient to be admitted for sepsis.  EDCM updated family at bedside.  No further EDCM needs at this time.

## 2015-04-12 NOTE — H&P (Signed)
[facility] History and Physical  Shelly [name] YPP:[id number] DOB: Aug 24, 1918 DOA: 04/12/2015  Referring physician:  Alvira Monday PCP:  Cain Saupe, MD   Chief Complaint:  fall  HPI:  The patient is a 81 y.o. year-old female with history of dementia, atrial fibrillation and SVT, rheumatoid arthritis on chronic prednisone, iron deficiency anemia, CKD stage 4, ICH with residual left sided weakness who presents with recurrent falls.  She lives at home with her daughter who cares for her.  She ambulates with a rolling walker and does not always recognize her daughter or where she is.  Over the last month, she has had progressive falls and weakness.  She has been seen three times in the ER for falls, this fall being the worst.  She was diagnosed with diverticulitis on 9/19 started on antibiotics which resolved her symptoms, however, her stools became soft a week ago.  She is normally continent of urine, but about three days ago, she started to wet her depends at night.  Family is unaware of any fevers.  She had nausea with light yellow green emesis since yesterday.  Family brought her in because she had an unwitnessed fall to a carpeted floor the night prior to admission which caused her to have stiffness and pain.  She was unable to stand with her walker even with three person assist.    In the ER, her vital signs were notable for temperature of 102.2 Fahrenheit, tachycardic to 107, blood pressure stable, breathing comfortably on room air. Her white blood cell count was 13.2, hemoglobin 10.4, creatinine 1.65. Her urinalysis was positive with large leukocyte esterase, too numerous to count WBCs, many bacteria. Her chest x-ray was negative. She had CT head, cervical spine, x-ray of the hips, x-ray of the left knee, left femur, and lumbar spine which demonstrated no evidence of acute abnormality. Lactic acid and blood cultures have been ordered as well as 2 L bolus and ceftriaxone. She is being admitted  for sepsis secondary to urinary tract infection.  Review of Systems:  Unable to obtain secondary to dementia.   Past Medical History  Diagnosis Date  . Dementia   . Rheumatoid arthritis (HCC)   . Atrial fibrillation (HCC)   . Recurrent falls   . ICH (intracerebral hemorrhage) (HCC)     stroke with residual left sided weakness  . CKD (chronic kidney disease), stage IV (HCC)     baseline creatinine now 1.6  . SVT (supraventricular tachycardia) (HCC)   . DVT (deep venous thrombosis) (HCC)     RLE   Past Surgical History  Procedure Laterality Date  . Total abdominal hysterectomy w/ bilateral salpingoophorectomy    . Replacement total knee bilateral     Social History:  reports that she has never smoked. She does not have any smokeless tobacco history on file. She reports that she does not drink alcohol or use illicit drugs.  No Known Allergies  Family History  Problem Relation Age of Onset  . High blood pressure    . Cancer Neg Hx   . Thyroid nodules       Prior to Admission medications   Medication Sig Start Date End Date Taking? Authorizing Provider  aspirin 81 MG tablet Take 81 mg by mouth daily.   Yes Historical Provider, MD  docusate sodium (COLACE) 100 MG capsule Take 100 mg by mouth daily as needed for mild constipation.   Yes Historical Provider, MD  ferrous sulfate 325 (65 FE) MG tablet Take 325  mg by mouth daily with breakfast.   Yes Historical Provider, MD  furosemide (LASIX) 20 MG tablet Take 20 mg by mouth daily.   Yes Historical Provider, MD  HYDROcodone-acetaminophen (NORCO/VICODIN) 5-325 MG tablet Take 1 tablet by mouth every 8 (eight) hours as needed for moderate pain.   Yes Historical Provider, MD  magnesium oxide (MAG-OX) 400 MG tablet Take 400 mg by mouth daily.   Yes Historical Provider, MD  metoprolol tartrate (LOPRESSOR) 25 MG tablet Take 25 mg by mouth 2 (two) times daily.   Yes Historical Provider, MD  mirtazapine (REMERON) 15 MG tablet Take 15 mg by  mouth at bedtime.   Yes Historical Provider, MD  pantoprazole (PROTONIX) 40 MG tablet Take 40 mg by mouth daily.   Yes Historical Provider, MD  predniSONE (DELTASONE) 5 MG tablet Take 5 mg by mouth daily with breakfast.   Yes Historical Provider, MD   Physical Exam: Filed Vitals:   04/12/15 1259 04/12/15 1504 04/12/15 1757  BP: 152/68 153/70   Pulse: 85 107   Temp: 100.5 F (38.1 C)  102.2 F (39 C)  TempSrc: Oral  Oral  Resp: 18 24   SpO2: 100% 96%      General:  Adult female, no acute distress, lying slightly to the right side on the stretcher  Eyes:  PERRL, anicteric, non-injected. Bilateral cataract surgery evidence  ENT:  Nares clear.  OP clear, non-erythematous without plaques or exudates.  MMM. Dentures.  Neck:  Supple without TM or JVD.    Lymph:  No cervical, supraclavicular, or submandibular LAD.  Cardiovascular:  IRRR and tachycardic, normal S1, S2, 2/6 systolic murmur at the right upper sternal border  2+ pulses, warm extremities  Respiratory:  CTA bilaterally without increased WOB.  Abdomen:  NABS.  Soft, ND/NT.    Skin:  No rashes or focal lesions.  Musculoskeletal:  Normal bulk and tone.  1+ pitting bilateral LE edema.  Psychiatric:  A & O to person only.  Blunted affect.  Neurologic:  CN 3-12 grossly intact.  3/5 strength bilateral lower extremities, 4-5 upper extremities.  Sensation intact.  Labs on Admission:  Basic Metabolic Panel:  Recent Labs Lab 04/12/15 1520  NA 135  K 4.1  CL 101  CO2 24  GLUCOSE 110*  BUN 23*  CREATININE 1.65*  CALCIUM 8.9   Liver Function Tests:  Recent Labs Lab 04/12/15 1520  AST 33  ALT 13*  ALKPHOS 40  BILITOT 1.2  PROT 8.3*  ALBUMIN 3.1*   No results for input(s): LIPASE, AMYLASE in the last 168 hours. No results for input(s): AMMONIA in the last 168 hours. CBC:  Recent Labs Lab 04/12/15 1520  WBC 13.2*  NEUTROABS 10.2*  HGB 10.4*  HCT 30.1*  MCV 70.8*  PLT 338   Cardiac Enzymes: No  results for input(s): CKTOTAL, CKMB, CKMBINDEX, TROPONINI in the last 168 hours.  BNP (last 3 results) No results for input(s): BNP in the last 8760 hours.  ProBNP (last 3 results) No results for input(s): PROBNP in the last 8760 hours.  CBG: No results for input(s): GLUCAP in the last 168 hours.  Radiological Exams on Admission: Dg Chest 2 View  04/12/2015   CLINICAL DATA:  Recent fall with chest pain, initial encounter  EXAM: CHEST - 2 VIEW  COMPARISON:  None.  FINDINGS: Cardiac shadow is within normal limits. Patchy fibrotic changes are noted throughout both lungs without focal infiltrate or sizable effusion. No acute bony abnormality is noted.  IMPRESSION:  No acute abnormality noted.   Electronically Signed   By: Alcide Clever M.D.   On: 04/12/2015 14:57   Dg Lumbar Spine Complete  04/12/2015   CLINICAL DATA:  Fall last evening with low back pain, initial encounter  EXAM: LUMBAR SPINE - COMPLETE 4+ VIEW  COMPARISON:  None.  FINDINGS: Five lumbar type vertebral bodies are well visualized. There is compression deformity of L1 which appears chronic in nature although no prior exams are available for comparison. Correlation to point tenderness is recommended. Mild degenerative spondylolisthesis of L4 on L5 is seen. Scattered fecal material is noted throughout the colon. No other focal abnormality is noted.  IMPRESSION: Chronic appearing L1 compression deformity.  Degenerative anterolisthesis of L4 on L5.   Electronically Signed   By: Alcide Clever M.D.   On: 04/12/2015 14:59   Ct Head Wo Contrast  04/12/2015   CLINICAL DATA:  Advanced dementia.  Fall.  EXAM: CT HEAD WITHOUT CONTRAST  CT CERVICAL SPINE WITHOUT CONTRAST  TECHNIQUE: Multidetector CT imaging of the head and cervical spine was performed following the standard protocol without intravenous contrast. Multiplanar CT image reconstructions of the cervical spine were also generated.  COMPARISON:  None.  FINDINGS: CT HEAD FINDINGS  There is no  evidence of mass effect, midline shift, or extra-axial fluid collections. There is no evidence of a space-occupying lesion or intracranial hemorrhage. There is no evidence of a cortical-based area of acute infarction. There is generalized cerebral atrophy. There is periventricular white matter low attenuation likely secondary to microangiopathy.  The ventricles and sulci are appropriate for the patient's age. The basal cisterns are patent.  Visualized portions of the orbits are unremarkable. The visualized portions of the paranasal sinuses and mastoid air cells are unremarkable. Cerebrovascular atherosclerotic calcifications are noted.  The osseous structures are unremarkable.  CT CERVICAL SPINE FINDINGS  The alignment is anatomic. The vertebral body heights are maintained. There is no acute fracture. There is 2 mm of anterolisthesis of C7 on T1 secondary to left facet arthropathy. The prevertebral soft tissues are normal. The intraspinal soft tissues are not fully imaged on this examination due to poor soft tissue contrast, but there is no gross soft tissue abnormality.  There is degenerative disc disease at C3-4, C5-6, C6-7 and C7-T1. Bilateral uncovertebral degenerative changes and mild facet arthropathy at C3-4. Bilateral uncovertebral degenerative changes and bilateral facet arthropathy at C4-5. Mild broad-based disc osteophyte complex with bilateral uncovertebral degenerative changes and facet arthropathy at C5-C6 and C6-7.  The visualized portions of the lung apices demonstrate no focal abnormality. There is a 19 mm right thyroid nodule. 12 mm left thyroid nodule.  IMPRESSION: 1.  No acute intracranial pathology.  2.  No acute osseous injury of the cervical spine.  3.  Cervical spine spondylosis as described above.  4. 19 mm hypodense right thyroid nodule of indeterminate etiology. 12 mm left thyroid nodule of indeterminate etiology.   Electronically Signed   By: Elige Ko   On: 04/12/2015 15:13   Ct  Cervical Spine Wo Contrast  04/12/2015   CLINICAL DATA:  Advanced dementia.  Fall.  EXAM: CT HEAD WITHOUT CONTRAST  CT CERVICAL SPINE WITHOUT CONTRAST  TECHNIQUE: Multidetector CT imaging of the head and cervical spine was performed following the standard protocol without intravenous contrast. Multiplanar CT image reconstructions of the cervical spine were also generated.  COMPARISON:  None.  FINDINGS: CT HEAD FINDINGS  There is no evidence of mass effect, midline shift, or extra-axial fluid collections.  There is no evidence of a space-occupying lesion or intracranial hemorrhage. There is no evidence of a cortical-based area of acute infarction. There is generalized cerebral atrophy. There is periventricular white matter low attenuation likely secondary to microangiopathy.  The ventricles and sulci are appropriate for the patient's age. The basal cisterns are patent.  Visualized portions of the orbits are unremarkable. The visualized portions of the paranasal sinuses and mastoid air cells are unremarkable. Cerebrovascular atherosclerotic calcifications are noted.  The osseous structures are unremarkable.  CT CERVICAL SPINE FINDINGS  The alignment is anatomic. The vertebral body heights are maintained. There is no acute fracture. There is 2 mm of anterolisthesis of C7 on T1 secondary to left facet arthropathy. The prevertebral soft tissues are normal. The intraspinal soft tissues are not fully imaged on this examination due to poor soft tissue contrast, but there is no gross soft tissue abnormality.  There is degenerative disc disease at C3-4, C5-6, C6-7 and C7-T1. Bilateral uncovertebral degenerative changes and mild facet arthropathy at C3-4. Bilateral uncovertebral degenerative changes and bilateral facet arthropathy at C4-5. Mild broad-based disc osteophyte complex with bilateral uncovertebral degenerative changes and facet arthropathy at C5-C6 and C6-7.  The visualized portions of the lung apices demonstrate no  focal abnormality. There is a 19 mm right thyroid nodule. 12 mm left thyroid nodule.  IMPRESSION: 1.  No acute intracranial pathology.  2.  No acute osseous injury of the cervical spine.  3.  Cervical spine spondylosis as described above.  4. 19 mm hypodense right thyroid nodule of indeterminate etiology. 12 mm left thyroid nodule of indeterminate etiology.   Electronically Signed   By: Elige Ko   On: 04/12/2015 15:13   Dg Knee Complete 4 Views Left  04/12/2015   CLINICAL DATA:  Fall  EXAM: LEFT KNEE - COMPLETE 4+ VIEW  COMPARISON:  None.  FINDINGS: Total knee arthroplasty is anatomically aligned. No breakage or loosening of the hardware. No fracture. Osteopenia.  IMPRESSION: No acute bony pathology.   Electronically Signed   By: Jolaine Click M.D.   On: 04/12/2015 14:46   Dg Hip Unilat With Pelvis 2-3 Views Left  04/12/2015   CLINICAL DATA:  Fall last evening with left hip pain, initial encounter  EXAM: DG HIP (WITH OR WITHOUT PELVIS) 2-3V LEFT  COMPARISON:  None.  FINDINGS: Pelvic ring is intact. No acute fracture or dislocation is noted. No gross soft tissue abnormality is seen. Calcified uterine fibroid is noted.  IMPRESSION: No acute bony abnormality seen.   Electronically Signed   By: Alcide Clever M.D.   On: 04/12/2015 14:46   Dg Femur Min 2 Views Left  04/12/2015   CLINICAL DATA:  Fall last evening with left leg pain, initial encounter  EXAM: LEFT FEMUR 2 VIEWS  COMPARISON:  None.  FINDINGS: Left knee prosthesis is noted. No acute fracture or dislocation is seen. No prosthetic loosening is noted. No soft tissue abnormality is seen.  IMPRESSION: No acute abnormality noted.   Electronically Signed   By: Alcide Clever M.D.   On: 04/12/2015 14:56    EKG: Independently reviewed. Atrial fibrillation with stable inverted T waves in V3 through V5.   Assessment/Plan Principal Problem:   Sepsis (HCC) Active Problems:   UTI (lower urinary tract infection)   Dementia   Paroxysmal atrial fibrillation  (HCC)   CKD (chronic kidney disease) stage 4, GFR 15-29 ml/min (HCC)   Iron deficiency anemia   Recurrent falls   Generalized weakness  ---  Sepsis secondary to urinary tract infection present at the time of admission -  Follow-up lactic acid -  IV fluids -  Urine culture -  Blood cultures -  Ceftriaxone -  Tylenol when necessary fever  Dementia, at risk for delirium.   -  Frequent reorientation  Paroxysmal atrial fibrillation, currently sinus tachycardia 1 -  CHADs2vasc = 3 (minimum).   -  HASBLED = 2 -  Not on full anticoagulation due to age and dementia -  Continue beta blocker and aspirin  Rheumatoid arthritis, stable, continue prednisone  CKD stage IV, creatinine at baseline -  Minimize nephrotoxicity and renally dose medications -  Repeat creatinine in a.m.   -  Hold lasix pending lactic acid  Leukocytosis, secondary to sepsis from urinary tract infection  -  IV fluids and antibiotics and repeat CBC in a.m.   Iron deficiency anemia, hgb at baseline of 10.  Continue iron supplementation  Recurrent falls, likely secondary to dementia and urinary tract infection -  Falls precautions -  PT/OT assessments -  Social work consult for skilled nursing facility placement  Diet:  Dysphagia 1 with thin   Access:  PIV IVF:  yes Proph:  lovenox  Code Status: DNR in discussion with Ubaldo Glassing Family Communication: patient and her daughter Disposition Plan: Admit to telemetry overnight due to risk of SVT/arrhythmias which could be medically managed but would stop ASAP in AM.     Time spent: 60 min Mikal Blasdell [facility] Pager [phone]  If 7PM-7AM, please contact night-coverage [email or web address] Password [email or web address] 04/12/2015, 7:15 PM

## 2015-04-12 NOTE — ED Notes (Signed)
Pt unable to stand without assistance. MD at bedside.

## 2015-04-12 NOTE — ED Notes (Signed)
MD at bedside. Hospitalist at bedside. Daughter at bedside.

## 2015-04-12 NOTE — ED Provider Notes (Addendum)
CSN: [id number]     Arrival date & time 04/12/15  1221 History   First MD Initiated Contact with Patient 04/12/15 1309     Chief Complaint  Patient presents with  . Fall     (Consider location/radiation/quality/duration/timing/severity/associated sxs/prior Treatment) HPI  Patient is a 81 year old female with advanced dementia presenting after fall. Patient was seen last week for similar complaint. Patient fell last night was brought into bed and has pain to her left hip this morning. Brought by family for evaluation.   Past Medical History  Diagnosis Date  . Dementia   . Arthritis   . Atrial fibrillation (HCC)    No past surgical history on file. No family history on file. Social History  Substance Use Topics  . Smoking status: Never Smoker   . Smokeless tobacco: None  . Alcohol Use: No   OB History    No data available     Review of Systems  Unable to perform ROS: Dementia      Allergies  Review of patient's allergies indicates no known allergies.  Home Medications   Prior to Admission medications   Not on File   BP 152/68 mmHg  Pulse 85  Temp(Src) 100.5 F (38.1 C) (Oral)  Resp 18  SpO2 100% Physical Exam  Constitutional: She is oriented to person, place, and time. She appears well-developed and well-nourished.  HENT:  Head: Normocephalic and atraumatic.  Eyes: Conjunctivae are normal. Right eye exhibits no discharge.  Neck: Neck supple.  Cardiovascular: Normal rate, regular rhythm and normal heart sounds.   No murmur heard. Pulmonary/Chest: Effort normal and breath sounds normal. She has no wheezes. She has no rales.  Abdominal: Soft. She exhibits no distension. There is no tenderness.  Musculoskeletal: Normal range of motion. She exhibits no edema.  Left hip internally rotated. Pain over the left greater trochanter. Pulses distally intact. Sensation distally intact. Pain to palpation of left knee as well.  Neurological: She is oriented to person,  place, and time. No cranial nerve deficit.  Skin: Skin is warm and dry. No rash noted. She is not diaphoretic.  Psychiatric: She has a normal mood and affect. Her behavior is normal.  Nursing note and vitals reviewed.   ED Course  Procedures (including critical care time) Labs Review Labs Reviewed - No data to display  Imaging Review No results found. I have personally reviewed and evaluated these images and lab results as part of my medical decision-making.   EKG Interpretation None      MDM   Final diagnoses:  None    Patient is a 81 year old female with dementia presenting after unwitnessed fall at home. Patient has pain to left hip. It is internally rotated. Patient states she has some L-spine tenderness to. We will get CT head and neck given her age and the fall.  We will check for urinary tract infection and pneumonia. Given increasing recent falls. (She was here last week for fall as wel)  3:24 PM Patient's plain films are normal. I went and reevaluated her hip and she's got no tenderness to palpation and she is ranging normal now. Patient's family usually splits the care of her mother with her other sister but other sister is gone for the week. Reportedly this is her third visit in three weeks. (charts not merged). There is some concern of neglect/difficulty caring for patiet, she arrived in wet clothing.   4:20 PM Tried to walk with paitet and 3 assists.Could NOT even get her  to stand with walker and multiple assists.   Patient is demented and not able to advocate for herself, I don't feel that she has a safe living environment with the inability to pivot/stand at home.    4:32 PM Just saw her urine which is frankly purulent. Will plan to admit for weakness with UTI.    Zeno Hickel Randall An, MD 04/12/15 1621  Annibelle Brazie Randall An, MD 04/12/15 1629  Destanee Bedonie Randall An, MD 04/12/15 0762

## 2015-04-12 NOTE — Progress Notes (Signed)
ANTIBIOTIC CONSULT NOTE - INITIAL  Pharmacy Consult for Rocephin Indication: UTI  No Known Allergies  Patient Measurements: Height: 5\' 2"  (157.5 cm) Weight: 128 lb 8.5 oz (58.3 kg) IBW/kg (Calculated) : 50.1   Vital Signs: Temp: 98.2 F (36.8 C) (10/07 2031) Temp Source: Oral (10/07 2031) BP: 126/56 mmHg (10/07 2031) Pulse Rate: 125 (10/07 2031) Intake/Output from previous day:   Intake/Output from this shift:    Labs:  Recent Labs  04/12/15 1520  WBC 13.2*  HGB 10.4*  PLT 338  CREATININE 1.65*   Estimated Creatinine Clearance: 16.5 mL/min (by C-G formula based on Cr of 1.65). No results for input(s): VANCOTROUGH, VANCOPEAK, VANCORANDOM, GENTTROUGH, GENTPEAK, GENTRANDOM, TOBRATROUGH, TOBRAPEAK, TOBRARND, AMIKACINPEAK, AMIKACINTROU, AMIKACIN in the last 72 hours.   Microbiology: No results found for this or any previous visit (from the past 720 hour(s)).  Medical History: Past Medical History  Diagnosis Date  . Dementia   . Rheumatoid arthritis (HCC)   . Atrial fibrillation (HCC)   . Recurrent falls   . ICH (intracerebral hemorrhage) (HCC)     stroke with residual left sided weakness  . CKD (chronic kidney disease), stage IV (HCC)     baseline creatinine now 1.6  . SVT (supraventricular tachycardia) (HCC)   . DVT (deep venous thrombosis) (HCC)     RLE    Medications:  Scheduled:  . [START ON 04/13/2015] aspirin EC  81 mg Oral Daily  . enoxaparin (LOVENOX) injection  40 mg Subcutaneous Q24H  . [START ON 04/13/2015] ferrous sulfate  325 mg Oral Q breakfast  . metoprolol tartrate  25 mg Oral BID  . mirtazapine  15 mg Oral QHS  . [START ON 04/13/2015] pantoprazole  40 mg Oral Daily  . [START ON 04/13/2015] predniSONE  5 mg Oral Q breakfast  . sodium chloride  3 mL Intravenous Q12H   Infusions:   PRN: acetaminophen **OR** acetaminophen, ondansetron **OR** ondansetron (ZOFRAN) IV Assessment: 89yoF with history of dementia, Afib and SVT, rheumatoid arthritis  on chronic prednisone, CKD stage 4, iron deficiency anemia, ICH with residual left sided weakness presenting to the ER with recurrent falls. Rocephin to be started for UTI.  Goal of Therapy:  Eradication of infection  Plan:  Rocephin 1gm IV Q24h starting tomorrow (received 1gm at 1900). Will sign off.  Please reconsult if needed.   06/13/2015 04/12/2015,8:43 PM

## 2015-04-13 DIAGNOSIS — A4151 Sepsis due to Escherichia coli [E. coli]: Principal | ICD-10-CM

## 2015-04-13 LAB — BASIC METABOLIC PANEL
Anion gap: 10 (ref 5–15)
BUN: 20 mg/dL (ref 6–20)
CO2: 21 mmol/L — ABNORMAL LOW (ref 22–32)
Calcium: 8.5 mg/dL — ABNORMAL LOW (ref 8.9–10.3)
Chloride: 107 mmol/L (ref 101–111)
Creatinine, Ser: 1.3 mg/dL — ABNORMAL HIGH (ref 0.44–1.00)
GFR calc Af Amer: 39 mL/min — ABNORMAL LOW (ref >60)
GFR calc non Af Amer: 34 mL/min — ABNORMAL LOW (ref >60)
Glucose, Bld: 87 mg/dL (ref 65–99)
Potassium: 4.1 mmol/L (ref 3.5–5.1)
Sodium: 138 mmol/L (ref 135–145)

## 2015-04-13 LAB — CBC
HCT: 29.3 % — ABNORMAL LOW (ref 36.0–46.0)
Hemoglobin: 9.9 g/dL — ABNORMAL LOW (ref 12.0–15.0)
MCH: 24.1 pg — ABNORMAL LOW (ref 26.0–34.0)
MCHC: 33.8 g/dL (ref 30.0–36.0)
MCV: 71.3 fL — ABNORMAL LOW (ref 78.0–100.0)
Platelets: 342 10*3/uL (ref 150–400)
RBC: 4.11 MIL/uL (ref 3.87–5.11)
RDW: 16.1 % — ABNORMAL HIGH (ref 11.5–15.5)
WBC: 12.2 10*3/uL — ABNORMAL HIGH (ref 4.0–10.5)

## 2015-04-13 NOTE — Evaluation (Signed)
Physical Therapy Evaluation Patient Details Name: Shelly [name] MRN: [id number] DOB: 05-19-1919 Today's Date: 04/13/2015   History of Present Illness  The patient is a 81 y.o. year-old female with history of dementia, atrial fibrillation and SVT, rheumatoid arthritis on chronic prednisone, iron deficiency anemia, CKD stage 4, ICH with residual left sided weakness who presents with recurrent falls. She lives at home with her daughter who cares for her. She ambulates with a rolling walker and does not always recognize her daughter or where she is. Over the last month, she has had progressive falls and weakness. She has been seen three times in the ER for falls, this fall being the worst. She was diagnosed with diverticulitis on 9/19 started on antibiotics which resolved her symptoms, however, her stools became soft a week ago. She is normally continent of urine, but about three days ago, she started to wet her depends at night. Family is unaware of any fevers. She had nausea with light yellow green emesis since yesterday. Family brought her in because she had an unwitnessed fall to a carpeted floor the night prior to admission which caused her to have stiffness and pain. She was unable to stand with her walker even with three person assist  Clinical Impression  Patient  Requires 2 person assist for scoot slide transfer as patient was resistive with attempts to stand. Patient listing to the Right. Patient will benefit from PT to address problems listed in the note below. Patient is complaining of  back  pain and states"I hurt it when I fell".  Follow Up Recommendations SNF;Supervision/Assistance - 24 hour    Equipment Recommendations  None recommended by PT    Recommendations for Other Services       Precautions / Restrictions Precautions Precautions: Fall Precaution Comments: incontinence      Mobility  Bed Mobility Overal bed mobility: Needs Assistance Bed Mobility: Supine to Sit      Supine to sit: Max assist;HOB elevated     General bed mobility comments: patient  does not really assist.  Transfers Overall transfer level: Needs assistance   Transfers: Lateral/Scoot Transfers           General transfer comment: Patient was unable to stand at all with 2  persons, used bed pad to slide  patient across to  recliner with drop arm.  Ambulation/Gait                Stairs            Wheelchair Mobility    Modified Rankin (Stroke Patients Only)       Balance Overall balance assessment: History of Falls;Needs assistance Sitting-balance support: Feet supported;Bilateral upper extremity supported Sitting balance-Leahy Scale: Poor Sitting balance - Comments: gradually able to support self  for short periods. Postural control: Right lateral lean;Posterior lean                                   Pertinent Vitals/Pain Pain Assessment: Faces Faces Pain Scale: Hurts even more Pain Location: my back hurts. Pain Descriptors / Indicators: Grimacing;Guarding Pain Intervention(s): Monitored during session    Home Living Family/patient expects to be discharged to:: Skilled nursing facility Living Arrangements: Spouse/significant other;Children                    Prior Function Level of Independence: Needs assistance   Gait / Transfers Assistance Needed: walks with a RW with  help           Hand Dominance        Extremity/Trunk Assessment   Upper Extremity Assessment: RUE deficits/detail;LUE deficits/detail RUE Deficits / Details: crepitus of  shoulder and hands     LUE Deficits / Details: same   Lower Extremity Assessment: RLE deficits/detail;LLE deficits/detail RLE Deficits / Details: does not bear weight LLE Deficits / Details: same as R  Cervical / Trunk Assessment: Kyphotic  Communication      Cognition Arousal/Alertness: Awake/alert Behavior During Therapy: WFL for tasks assessed/performed Overall  Cognitive Status: History of cognitive impairments - at baseline                      General Comments      Exercises        Assessment/Plan    PT Assessment Patient needs continued PT services  PT Diagnosis Difficulty walking;Generalized weakness;Acute pain;Altered mental status   PT Problem List Decreased strength;Decreased activity tolerance;Decreased balance;Decreased mobility;Decreased knowledge of precautions;Decreased safety awareness;Decreased knowledge of use of DME;Pain  PT Treatment Interventions DME instruction;Gait training;Functional mobility training;Therapeutic activities;Therapeutic exercise;Patient/family education   PT Goals (Current goals can be found in the Care Plan section) Acute Rehab PT Goals Patient Stated Goal: per daughter, to walk again. PT Goal Formulation: With family Time For Goal Achievement: 04/27/15 Potential to Achieve Goals: Good    Frequency Min 3X/week   Barriers to discharge        Co-evaluation               End of Session Equipment Utilized During Treatment: Gait belt Activity Tolerance: Patient tolerated treatment well Patient left: in chair;with call bell/phone within reach;with family/visitor present Nurse Communication: Mobility status;Need for lift equipment         Time: 4580-9983 PT Time Calculation (min) (ACUTE ONLY): 29 min   Charges:   PT Evaluation $Initial PT Evaluation Tier I: 1 Procedure PT Treatments $Therapeutic Activity: 8-22 mins   PT G Codes:        Rada Hay 04/13/2015, 4:19 PM Blanchard Kelch PT [phone]

## 2015-04-13 NOTE — Progress Notes (Signed)
[facility] PROGRESS NOTE  Shelly [name] GYF:[id number] DOB: 20-Jul-1918 DOA: 04/12/2015 PCP: Cain Saupe, MD  Brief Summary  The patient is a 81 y.o. year-old female with history of dementia, atrial fibrillation and SVT, rheumatoid arthritis on chronic prednisone, iron deficiency anemia, CKD stage 4, ICH with residual left sided weakness who presents with recurrent falls. She lives at home with her daughter who cares for her. She ambulates with a rolling walker and does not always recognize her daughter or where she is. Over the last month, she has had progressive falls and weakness. She has been seen three times in the ER for falls, this fall being the worst. She was diagnosed with diverticulitis on 9/19 started on antibiotics which resolved her symptoms, however, her stools became soft a week ago. She is normally continent of urine, but about three days ago, she started to wet her depends at night. Family is unaware of any fevers. She had nausea with light yellow green emesis since yesterday. Family brought her in because she had an unwitnessed fall to a carpeted floor the night prior to admission which caused her to have stiffness and pain. She was unable to stand with her walker even with three person assist.   In the ER, her vital signs were notable for temperature of 102.2 Fahrenheit, tachycardic to 107, blood pressure stable, breathing comfortably on room air. Her white blood cell count was 13.2, hemoglobin 10.4, creatinine 1.65. Her urinalysis was positive with large leukocyte esterase, too numerous to count WBCs, many bacteria. Her chest x-ray was negative. She had CT head, cervical spine, x-ray of the hips, x-ray of the left knee, left femur, and lumbar spine which demonstrated no evidence of acute abnormality. Lactic acid and blood cultures have been ordered as well as 2 L bolus and ceftriaxone. She is being admitted for sepsis secondary to urinary tract  infection.   Assessment/Plan   Sepsis secondary to E. Coli urinary tract infection present at the time of admission -  Lactic acid wnl - IV fluids - Urine culture:  pending - Blood cultures:  NGTD - Continue Ceftriaxone - Tylenol when necessary fever  Dementia, at risk for delirium.  - Frequent reorientation  Paroxysmal atrial fibrillation, rate controlled with intermittent NSR on telemetry - CHADs2vasc = 3 (minimum).  - HASBLED = 2 - Not on full anticoagulation due to age and dementia - Continue beta blocker and aspirin -  D/c telemetry  Rheumatoid arthritis, stable, continue prednisone  CKD stage IV, creatinine at baseline - Minimize nephrotoxicity and renally dose medications - Repeat creatinine in a.m.  - Hold lasix today, but will resume tomorrow  Leukocytosis, secondary to sepsis from urinary tract infection, marginally improved - IV fluids and antibiotics and repeat CBC in a.m.   Iron deficiency anemia, hgb at baseline of 10. Continue iron supplementation  Recurrent falls, likely secondary to dementia and urinary tract infection - Falls precautions - PT/OT recommending SNF - Appreciate Social work assistance  Diet: Dysphagia 1 with thin  Access: PIV IVF: yes Proph: lovenox  Code Status: DNR  Family Communication: patient and her daughter Shelly [name] Disposition Plan:  Pending improvement in white blood cell count, heart rate, possibly eating a little better, anticipate either discharge to home with home health services or possibly to skilled nursing facility if insurance will cover after Shelly [name] hospital stay  Consultants:  None  Procedures:  Chest x-ray  Lumbar spine x-ray  X-ray of the left knee, femur x-ray the pelvis  CT  of the head and cervical spine   Antibiotics: ceftriaxone 10/7  HPI/Subjective:  Patient demented, denies pain, shortness of breath, nausea, pain with urination  Objective: Filed Vitals:    04/12/15 2349 04/13/15 0506 04/13/15 0909 04/13/15 1451  BP: 140/56 137/60  121/76  Pulse: 92 107 105 93  Temp: 97.5 F (36.4 C) 100.1 F (37.8 C)  98.8 F (37.1 C)  TempSrc: Oral Oral  Oral  Resp: 21 20  18   Height:      Weight:      SpO2: 95% 93%  96%    Intake/Output Summary (Last 24 hours) at 04/13/15 1735 Last data filed at 04/13/15 1030  Gross per 24 hour  Intake    360 ml  Output      0 ml  Net    360 ml   Filed Weights   04/12/15 2031  Weight: 58.3 kg (128 lb 8.5 oz)   Body mass index is 23.5 kg/(m^2).  Exam:   General:  Adult female, no acute distress, lying hunched over to the right side in bed  HEENT:  NCAT, MMM  Cardiovascular:  IRRR, nl S1, S2 no mrg, 2+ pulses, warm extremities  Respiratory:  CTAB, no increased WOB  Abdomen:   NABS, soft, NT/ND  MSK:   Decreased tone and bulk, 1+ pitting bilateral LEE, swelling and ulnar deviation of the MCP joints bilaterally with significant joint deformities of fingers, knees, feet  Neuro:  Grossly intact  Data Reviewed: Basic Metabolic Panel:  Recent Labs Lab 04/12/15 1520 04/13/15 0510  NA 135 138  K 4.1 4.1  CL 101 107  CO2 24 21*  GLUCOSE 110* 87  BUN 23* 20  CREATININE 1.65* 1.30*  CALCIUM 8.9 8.5*   Liver Function Tests:  Recent Labs Lab 04/12/15 1520  AST 33  ALT 13*  ALKPHOS 40  BILITOT 1.2  PROT 8.3*  ALBUMIN 3.1*   No results for input(s): LIPASE, AMYLASE in the last 168 hours. No results for input(s): AMMONIA in the last 168 hours. CBC:  Recent Labs Lab 04/12/15 1520 04/13/15 0510  WBC 13.2* 12.2*  NEUTROABS 10.2*  --   HGB 10.4* 9.9*  HCT 30.1* 29.3*  MCV 70.8* 71.3*  PLT 338 342    Recent Results (from the past 240 hour(s))  Urine culture     Status: None (Preliminary result)   Collection Time: 04/12/15  4:26 PM  Result Value Ref Range Status   Specimen Description URINE, CATHETERIZED  Final   Special Requests NONE  Final   Culture   Final    >=100,000  COLONIES/mL ESCHERICHIA COLI Performed at [facility]    Report Status PENDING  Incomplete  Blood culture (routine x 2)     Status: None (Preliminary result)   Collection Time: 04/12/15  6:35 PM  Result Value Ref Range Status   Specimen Description BLOOD RIGHT ANTECUBITAL  Final   Special Requests BOTTLES DRAWN AEROBIC AND ANAEROBIC 2CC  Final   Culture   Final    NO GROWTH < 12 HOURS Performed at [facility] [place]    Report Status PENDING  Incomplete  Blood culture (routine x 2)     Status: None (Preliminary result)   Collection Time: 04/12/15  7:07 PM  Result Value Ref Range Status   Specimen Description BLOOD RIGHT WRIST  Final   Special Requests BOTTLES DRAWN AEROBIC AND ANAEROBIC 5CC  Final   Culture   Final    NO GROWTH < 12  HOURS Performed at [facility]    Report Status PENDING  Incomplete     Studies: Dg Chest 2 View  04/12/2015   CLINICAL DATA:  Recent fall with chest pain, initial encounter  EXAM: CHEST - 2 VIEW  COMPARISON:  None.  FINDINGS: Cardiac shadow is within normal limits. Patchy fibrotic changes are noted throughout both lungs without focal infiltrate or sizable effusion. No acute bony abnormality is noted.  IMPRESSION: No acute abnormality noted.   Electronically Signed   By: Alcide Clever M.D.   On: 04/12/2015 14:57   Dg Lumbar Spine Complete  04/12/2015   CLINICAL DATA:  Fall last evening with low back pain, initial encounter  EXAM: LUMBAR SPINE - COMPLETE 4+ VIEW  COMPARISON:  None.  FINDINGS: Five lumbar type vertebral bodies are well visualized. There is compression deformity of L1 which appears chronic in nature although no prior exams are available for comparison. Correlation to point tenderness is recommended. Mild degenerative spondylolisthesis of L4 on L5 is seen. Scattered fecal material is noted throughout the colon. No other focal abnormality is noted.  IMPRESSION: Chronic appearing L1 compression deformity.  Degenerative  anterolisthesis of L4 on L5.   Electronically Signed   By: Alcide Clever M.D.   On: 04/12/2015 14:59   Ct Head Wo Contrast  04/12/2015   CLINICAL DATA:  Advanced dementia.  Fall.  EXAM: CT HEAD WITHOUT CONTRAST  CT CERVICAL SPINE WITHOUT CONTRAST  TECHNIQUE: Multidetector CT imaging of the head and cervical spine was performed following the standard protocol without intravenous contrast. Multiplanar CT image reconstructions of the cervical spine were also generated.  COMPARISON:  None.  FINDINGS: CT HEAD FINDINGS  There is no evidence of mass effect, midline shift, or extra-axial fluid collections. There is no evidence of a space-occupying lesion or intracranial hemorrhage. There is no evidence of a cortical-based area of acute infarction. There is generalized cerebral atrophy. There is periventricular white matter low attenuation likely secondary to microangiopathy.  The ventricles and sulci are appropriate for the patient's age. The basal cisterns are patent.  Visualized portions of the orbits are unremarkable. The visualized portions of the paranasal sinuses and mastoid air cells are unremarkable. Cerebrovascular atherosclerotic calcifications are noted.  The osseous structures are unremarkable.  CT CERVICAL SPINE FINDINGS  The alignment is anatomic. The vertebral body heights are maintained. There is no acute fracture. There is 2 mm of anterolisthesis of C7 on T1 secondary to left facet arthropathy. The prevertebral soft tissues are normal. The intraspinal soft tissues are not fully imaged on this examination due to poor soft tissue contrast, but there is no gross soft tissue abnormality.  There is degenerative disc disease at C3-4, C5-6, C6-7 and C7-T1. Bilateral uncovertebral degenerative changes and mild facet arthropathy at C3-4. Bilateral uncovertebral degenerative changes and bilateral facet arthropathy at C4-5. Mild broad-based disc osteophyte complex with bilateral uncovertebral degenerative changes  and facet arthropathy at C5-C6 and C6-7.  The visualized portions of the lung apices demonstrate no focal abnormality. There is a 19 mm right thyroid nodule. 12 mm left thyroid nodule.  IMPRESSION: 1.  No acute intracranial pathology.  2.  No acute osseous injury of the cervical spine.  3.  Cervical spine spondylosis as described above.  4. 19 mm hypodense right thyroid nodule of indeterminate etiology. 12 mm left thyroid nodule of indeterminate etiology.   Electronically Signed   By: Elige Ko   On: 04/12/2015 15:13   Ct Cervical Spine Wo Contrast  04/12/2015   CLINICAL DATA:  Advanced dementia.  Fall.  EXAM: CT HEAD WITHOUT CONTRAST  CT CERVICAL SPINE WITHOUT CONTRAST  TECHNIQUE: Multidetector CT imaging of the head and cervical spine was performed following the standard protocol without intravenous contrast. Multiplanar CT image reconstructions of the cervical spine were also generated.  COMPARISON:  None.  FINDINGS: CT HEAD FINDINGS  There is no evidence of mass effect, midline shift, or extra-axial fluid collections. There is no evidence of a space-occupying lesion or intracranial hemorrhage. There is no evidence of a cortical-based area of acute infarction. There is generalized cerebral atrophy. There is periventricular white matter low attenuation likely secondary to microangiopathy.  The ventricles and sulci are appropriate for the patient's age. The basal cisterns are patent.  Visualized portions of the orbits are unremarkable. The visualized portions of the paranasal sinuses and mastoid air cells are unremarkable. Cerebrovascular atherosclerotic calcifications are noted.  The osseous structures are unremarkable.  CT CERVICAL SPINE FINDINGS  The alignment is anatomic. The vertebral body heights are maintained. There is no acute fracture. There is 2 mm of anterolisthesis of C7 on T1 secondary to left facet arthropathy. The prevertebral soft tissues are normal. The intraspinal soft tissues are not fully  imaged on this examination due to poor soft tissue contrast, but there is no gross soft tissue abnormality.  There is degenerative disc disease at C3-4, C5-6, C6-7 and C7-T1. Bilateral uncovertebral degenerative changes and mild facet arthropathy at C3-4. Bilateral uncovertebral degenerative changes and bilateral facet arthropathy at C4-5. Mild broad-based disc osteophyte complex with bilateral uncovertebral degenerative changes and facet arthropathy at C5-C6 and C6-7.  The visualized portions of the lung apices demonstrate no focal abnormality. There is a 19 mm right thyroid nodule. 12 mm left thyroid nodule.  IMPRESSION: 1.  No acute intracranial pathology.  2.  No acute osseous injury of the cervical spine.  3.  Cervical spine spondylosis as described above.  4. 19 mm hypodense right thyroid nodule of indeterminate etiology. 12 mm left thyroid nodule of indeterminate etiology.   Electronically Signed   By: Elige Ko   On: 04/12/2015 15:13   Dg Knee Complete 4 Views Left  04/12/2015   CLINICAL DATA:  Fall  EXAM: LEFT KNEE - COMPLETE 4+ VIEW  COMPARISON:  None.  FINDINGS: Total knee arthroplasty is anatomically aligned. No breakage or loosening of the hardware. No fracture. Osteopenia.  IMPRESSION: No acute bony pathology.   Electronically Signed   By: Jolaine Click M.D.   On: 04/12/2015 14:46   Dg Hip Unilat With Pelvis 2-3 Views Left  04/12/2015   CLINICAL DATA:  Fall last evening with left hip pain, initial encounter  EXAM: DG HIP (WITH OR WITHOUT PELVIS) 2-3V LEFT  COMPARISON:  None.  FINDINGS: Pelvic ring is intact. No acute fracture or dislocation is noted. No gross soft tissue abnormality is seen. Calcified uterine fibroid is noted.  IMPRESSION: No acute bony abnormality seen.   Electronically Signed   By: Alcide Clever M.D.   On: 04/12/2015 14:46   Dg Femur Min 2 Views Left  04/12/2015   CLINICAL DATA:  Fall last evening with left leg pain, initial encounter  EXAM: LEFT FEMUR 2 VIEWS  COMPARISON:   None.  FINDINGS: Left knee prosthesis is noted. No acute fracture or dislocation is seen. No prosthetic loosening is noted. No soft tissue abnormality is seen.  IMPRESSION: No acute abnormality noted.   Electronically Signed   By: Eulah Pont.D.  On: 04/12/2015 14:56    Scheduled Meds: . antiseptic oral rinse  7 mL Mouth Rinse BID  . aspirin EC  81 mg Oral Daily  . cefTRIAXone (ROCEPHIN)  IV  1 g Intravenous Q24H  . enoxaparin (LOVENOX) injection  30 mg Subcutaneous Q24H  . feeding supplement (ENSURE ENLIVE)  237 mL Oral BID BM  . ferrous sulfate  325 mg Oral Q breakfast  . metoprolol tartrate  25 mg Oral BID  . mirtazapine  15 mg Oral QHS  . pantoprazole  40 mg Oral Daily  . predniSONE  5 mg Oral Q breakfast  . sodium chloride  3 mL Intravenous Q12H   Continuous Infusions:   Principal Problem:   Sepsis (HCC) Active Problems:   UTI (lower urinary tract infection)   Dementia   Paroxysmal atrial fibrillation (HCC)   CKD (chronic kidney disease) stage 4, GFR 15-29 ml/min (HCC)   Iron deficiency anemia   Recurrent falls   Generalized weakness    Time spent: 30 min    Brigham Cobbins  [facility] Pager [phone]. If 7PM-7AM, please contact night-coverage at [email or web address], password [facility] 04/13/2015, 5:35 PM  LOS: 1 day

## 2015-04-13 NOTE — ED Provider Notes (Signed)
Received care from Dr. Corlis Leak 5:00.  Please see her note for h/p and prior care. Briefly this is a 82 year old female with a history of dementia who presents with frequent falls and generalized weakness with inability to ambulate.  Patient's temperature 100.5 on arrival, rising to 102.2, with heart rate increased to the low 100s. Urine concerning for infection.  Ordered blood cultures, lactate, IVF and rocephin and admitted to hospitalist for concern for sepsis secondary to urinary source.   Alvira Monday, MD 04/13/15 [phone]

## 2015-04-14 LAB — URINE CULTURE: Culture: >=100000

## 2015-04-14 MED ORDER — FUROSEMIDE 20 MG PO TABS
20.0000 mg | ORAL_TABLET | Freq: Every day | ORAL | Status: DC
  Administered 2015-04-14 – 2015-04-16 (×3): 20 mg via ORAL
  Filled 2015-04-14 (×3): qty 1

## 2015-04-14 MED ORDER — SODIUM CHLORIDE 0.9 % IJ SOLN
10.0000 mL | INTRAMUSCULAR | Status: DC | PRN
  Administered 2015-04-15 – 2015-04-16 (×2): 10 mL
  Filled 2015-04-14 (×2): qty 40

## 2015-04-14 MED ORDER — SODIUM CHLORIDE 0.9 % IV SOLN
250.0000 mg | Freq: Three times a day (TID) | INTRAVENOUS | Status: DC
  Administered 2015-04-14 – 2015-04-16 (×6): 250 mg via INTRAVENOUS
  Filled 2015-04-14 (×8): qty 250

## 2015-04-14 MED ORDER — HYDROCODONE-ACETAMINOPHEN 5-325 MG PO TABS
1.0000 | ORAL_TABLET | Freq: Three times a day (TID) | ORAL | Status: DC | PRN
  Administered 2015-04-14 – 2015-04-16 (×5): 1 via ORAL
  Filled 2015-04-14 (×5): qty 1

## 2015-04-14 NOTE — Progress Notes (Signed)
ANTIBIOTIC CONSULT NOTE - INITIAL  Pharmacy Consult for Primaxin Indication: Sepsis, ESBL UTI  No Known Allergies  Patient Measurements: Height: 5\' 2"  (157.5 cm) Weight: 128 lb 8.5 oz (58.3 kg) IBW/kg (Calculated) : 50.1  Vital Signs: Temp: 99.3 F (37.4 C) (10/09 0613) Temp Source: Oral (10/09 09-16-1997) BP: 119/60 mmHg (10/09 09-16-1997) Pulse Rate: 100 (10/09 0613) Intake/Output from previous day: 10/08 0701 - 10/09 0700 In: 180 [P.O.:180] Out: -  Intake/Output from this shift:    Labs:  Recent Labs  04/12/15 1520 04/13/15 0510  WBC 13.2* 12.2*  HGB 10.4* 9.9*  PLT 338 342  CREATININE 1.65* 1.30*   Estimated Creatinine Clearance: 20.9 mL/min (by C-G formula based on Cr of 1.3). No results for input(s): VANCOTROUGH, VANCOPEAK, VANCORANDOM, GENTTROUGH, GENTPEAK, GENTRANDOM, TOBRATROUGH, TOBRAPEAK, TOBRARND, AMIKACINPEAK, AMIKACINTROU, AMIKACIN in the last 72 hours.   Microbiology: Recent Results (from the past 720 hour(s))  Urine culture     Status: None   Collection Time: 04/12/15  4:26 PM  Result Value Ref Range Status   Specimen Description URINE, CATHETERIZED  Final   Special Requests NONE  Final   Culture   Final    >=100,000 COLONIES/mL ESCHERICHIA COLI Confirmed Extended Spectrum Beta-Lactamase Producer (ESBL) Performed at [facility]    Report Status 04/14/2015 FINAL  Final   Organism ID, Bacteria ESCHERICHIA COLI  Final      Susceptibility   Escherichia coli - MIC*    AMPICILLIN >=32 RESISTANT Resistant     CEFAZOLIN >=64 RESISTANT Resistant     CEFTRIAXONE >=64 RESISTANT Resistant     CIPROFLOXACIN >=4 RESISTANT Resistant     GENTAMICIN >=16 RESISTANT Resistant     IMIPENEM <=0.25 SENSITIVE Sensitive     NITROFURANTOIN <=16 SENSITIVE Sensitive     TRIMETH/SULFA <=20 SENSITIVE Sensitive     AMPICILLIN/SULBACTAM >=32 RESISTANT Resistant     PIP/TAZO 8 SENSITIVE Sensitive     * >=100,000 COLONIES/mL ESCHERICHIA COLI  Blood culture (routine x 2)      Status: None (Preliminary result)   Collection Time: 04/12/15  6:35 PM  Result Value Ref Range Status   Specimen Description BLOOD RIGHT ANTECUBITAL  Final   Special Requests BOTTLES DRAWN AEROBIC AND ANAEROBIC 2CC  Final   Culture   Final    NO GROWTH 2 DAYS Performed at [facility]    Report Status PENDING  Incomplete  Blood culture (routine x 2)     Status: None (Preliminary result)   Collection Time: 04/12/15  7:07 PM  Result Value Ref Range Status   Specimen Description BLOOD RIGHT WRIST  Final   Special Requests BOTTLES DRAWN AEROBIC AND ANAEROBIC 5CC  Final   Culture   Final    NO GROWTH 2 DAYS Performed at [facility]    Report Status PENDING  Incomplete    Medical History: Past Medical History  Diagnosis Date  . Dementia   . Rheumatoid arthritis (HCC)   . Atrial fibrillation (HCC)   . Recurrent falls   . ICH (intracerebral hemorrhage) (HCC)     stroke with residual left sided weakness  . CKD (chronic kidney disease), stage IV (HCC)     baseline creatinine now 1.6  . SVT (supraventricular tachycardia) (HCC)   . DVT (deep venous thrombosis) (HCC)     RLE    Medications:  Scheduled:  . antiseptic oral rinse  7 mL Mouth Rinse BID  . aspirin EC  81 mg Oral Daily  . enoxaparin (LOVENOX)  injection  30 mg Subcutaneous Q24H  . feeding supplement (ENSURE ENLIVE)  237 mL Oral BID BM  . ferrous sulfate  325 mg Oral Q breakfast  . metoprolol tartrate  25 mg Oral BID  . mirtazapine  15 mg Oral QHS  . pantoprazole  40 mg Oral Daily  . predniSONE  5 mg Oral Q breakfast  . sodium chloride  3 mL Intravenous Q12H   Infusions:   PRN: acetaminophen **OR** acetaminophen, ondansetron **OR** ondansetron (ZOFRAN) IV  Assessment: 89yoF with history of dementia, Afib and SVT, rheumatoid arthritis on chronic prednisone, CKD stage 4, iron deficiency anemia, ICH with residual left sided weakness presenting to the ER with recurrent falls. Rocephin initially started  for UTI. Culture now returns (+) ESBL UTI, so changing antibiotic to Primaxin.  10/7 >> Rocephin >> 10/9 10/9 >> Primaxin >>  10/7 blood x 2: ngtd 10/7 urine >100k E.coli (+) ESBL  Tm24h: 99.3 (100.5 on admit) WBC: 12.2 SCr 1.3, CrCl ~29 ml/min/1.38m2 (normalized)  Goal of Therapy:  Eradication of infection  Primaxin dose appropriate for weight and renal function  Plan:   Primaxin 250mg  IV q8h for CrCl 21-40 ml/min  Follow renal function and adjust as needed  May changed to Invanz 500mg  IV q24h to facility administration at discharge  June, PharmD, BCPS Pager: [phone] 04/14/2015,11:40 AM

## 2015-04-14 NOTE — Evaluation (Signed)
Occupational Therapy Evaluation Patient Details Name: Shelly [name] MRN: [id number] DOB: [birth year] Today's Date: 04/14/2015    History of Present Illness The patient is a 81 y.o. year-old female with history of dementia, atrial fibrillation and SVT, rheumatoid arthritis on chronic prednisone, iron deficiency anemia, CKD stage 4, ICH with residual left sided weakness who presents with recurrent falls. She lives at home with her daughter who cares for her. She ambulates with a rolling walker and does not always recognize her daughter or where she is. Over the last month, she has had progressive falls and weakness. She has been seen three times in the ER for falls, this fall being the worst. She was diagnosed with diverticulitis on 9/19 started on antibiotics which resolved her symptoms, however, her stools became soft a week ago. She is normally continent of urine, but about three days ago, she started to wet her depends at night. Family is unaware of any fevers. She had nausea with light yellow green emesis since yesterday. Family brought her in because she had an unwitnessed fall to a carpeted floor the night prior to admission which caused her to have stiffness and pain. She was unable to stand with her walker even with three person assist   Clinical Impression   This 81 yo female admitted with above presents to acute OT with decreased mobility/balance, decreased cognition (pre-existing), increased pain all affecting her ability to help with her self care as she was pta by being more mobile. She will benefit from acute OT with follow up HHOT (dtr prefers this over SNF) to work towards increased mobility.     Follow Up Recommendations  Home health OT;Supervision/Assistance - 24 hour;Other (comment) (daughter reports she wants pt to have rehab at home)    Equipment Recommendations  Hospital bed;Other (comment) (hoyer lift)       Precautions / Restrictions Precautions Precautions:  Fall Precaution Comments: incontinence Restrictions Weight Bearing Restrictions: No      Mobility Bed Mobility Overal bed mobility: Needs Assistance Bed Mobility: Rolling;Sidelying to Sit;Sit to Sidelying Rolling: Mod assist Sidelying to sit: Mod assist     Sit to sidelying: Max assist General bed mobility comments: Pt needed step-by step cues for rolling and coming up to sit  Transfers                 General transfer comment: Did not attempt due to pt c/o and displaying too much pain while just sitting EOB    Balance Overall balance assessment: Needs assistance;History of Falls Sitting-balance support: Feet supported;Bilateral upper extremity supported Sitting balance-Leahy Scale: Poor Sitting balance - Comments: pt very anxious about sitting EOB due to her back pain and possibly due to fear of falling                                    ADL                                         General ADL Comments: At baseline pt has A for bathing,dressing, toileting but is able to help by only needing minimal A for ambulation with RW     Vision Additional Comments: No change from baseline          Pertinent Vitals/Pain Pain Assessment: Faces Faces Pain Scale: Hurts whole lot Pain Location:  lower back Pain Descriptors / Indicators: Grimacing;Guarding;Aching;Sore Pain Intervention(s): Monitored during session;Repositioned     Hand Dominance Right   Extremity/Trunk Assessment Upper Extremity Assessment Upper Extremity Assessment: Generalized weakness RUE Deficits / Details: crepitus of  shoulder; OA of hands LUE Deficits / Details: crepitus of  shoulder; OA of hands           Communication Communication Communication: Expressive difficulties   Cognition Arousal/Alertness: Awake/alert Behavior During Therapy: Anxious (when I had her start moving with me) Overall Cognitive Status: History of cognitive impairments - at baseline                                 Home Living Family/patient expects to be discharged to:: Private residence Living Arrangements: Spouse/significant other;Children Available Help at Discharge: Family;Available 24 hours/day Type of Home: House             Bathroom Shower/Tub: Tub/shower unit;Curtain Shower/tub characteristics: Curtain       Home Equipment: Environmental consultant - 2 wheels;Bedside commode;Tub bench;Shower seat   Additional Comments: pt was a Producer, 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      Prior Functioning/Environment Level of Independence: Development worker, community / Transfers Assistance Needed: walks with a RW with help ADL's / Homemaking Assistance Needed: family bath, dress, and help toilet her; however pt is usually ambulatory with only 1 person A        OT Diagnosis: Generalized weakness;Cognitive deficits;Acute pain   OT Problem List: Decreased strength;Decreased activity tolerance;Impaired balance (sitting and/or standing);Pain;Decreased cognition   OT Treatment/Interventions: Self-care/ADL training;Balance training;Patient/family education;DME and/or AE instruction;Therapeutic activities    OT Goals(Current goals can be found in the care plan section) Acute Rehab OT Goals Patient Stated Goal: per daughter: "to get pain better under control so she can walk again"  OT Frequency: Min 2X/week              End of Session Nurse Communication:  (tylenol is not helping with back pain when it comes to mobility)  Activity Tolerance: Patient limited by pain (limited by anxiousness) Patient left: in bed;with family/visitor present   Time: 3875-6433 OT Time Calculation (min): 23 min Charges:  OT General Charges $OT Visit: 1 Procedure OT Evaluation $Initial OT Evaluation Tier I: 1 Procedure OT Treatments $Therapeutic Activity: 8-22 mins  Evette Georges [phone] 04/14/2015, 1:14 PM

## 2015-04-14 NOTE — Progress Notes (Signed)
Peripherally Inserted Central Catheter/Midline Placement  The IV Nurse has discussed with the patient and/or persons authorized to consent for the patient, the purpose of this procedure and the potential benefits and risks involved with this procedure.  The benefits include less needle sticks, lab draws from the catheter and patient may be discharged home with the catheter.  Risks include, but not limited to, infection, bleeding, blood clot (thrombus formation), and puncture of an artery; nerve damage and irregular heat beat.  Alternatives to this procedure were also discussed.  Dtr at bedside signed consent.  PICC/Midline Placement Documentation  PICC / Midline Single Lumen 04/14/15 PICC Right Basilic 33 cm 0 cm (Active)  Indication for Insertion or Continuance of Line Home intravenous therapies (PICC only) 04/14/2015  5:39 PM  Exposed Catheter (cm) 0 cm 04/14/2015  5:39 PM  Site Assessment Clean;Dry;Intact 04/14/2015  5:39 PM  Line Status Flushed;Saline locked;Blood return noted 04/14/2015  5:39 PM  Dressing Type Transparent 04/14/2015  5:39 PM  Dressing Status Clean;Dry;Intact;Antimicrobial disc in place 04/14/2015  5:39 PM  Line Care Connections checked and tightened 04/14/2015  5:39 PM  Line Adjustment (NICU/IV Team Only) No 04/14/2015  5:39 PM  Dressing Intervention New dressing 04/14/2015  5:39 PM  Dressing Change Due 04/21/15 04/14/2015  5:39 PM       Elliot Dally 04/14/2015, 5:40 PM

## 2015-04-14 NOTE — Progress Notes (Signed)
Initial Nutrition Assessment  INTERVENTION:   -Continue Ensure Enlive po BID, each supplement provides 350 kcal and 20 grams of protein -Encourage PO intake -RD to continue to monitor  NUTRITION DIAGNOSIS:   Inadequate oral intake related to lethargy/confusion as evidenced by meal completion < 25%.  GOAL:   Patient will meet greater than or equal to 90% of their needs  MONITOR:   PO intake, Supplement acceptance, Labs, Weight trends, Skin, I & O's  REASON FOR ASSESSMENT:   Malnutrition Screening Tool    ASSESSMENT:   81 y.o. year-old female with history of dementia, atrial fibrillation and SVT, rheumatoid arthritis on chronic prednisone, iron deficiency anemia, CKD stage 4, ICH with residual left sided weakness who presents with recurrent falls.  Pt in room with no family present. Pt unable to provide much history. Pt does report not eating well and that she did not eat any breakfast this morning. Pt had a bottle of Ensure open at bedside, RD noted 1/2 of the supplement drank. Pt eating ~25% of dysphagia 2 diet.  Labs reviewed: Elevated Creatinine  Diet Order:  DIET DYS 2 Room service appropriate?: Yes; Fluid consistency:: Thin  Skin:  Reviewed, no issues  Last BM:  PTA  Height:   Ht Readings from Last 1 Encounters:  04/12/15 5\' 2"  (1.575 m)    Weight:   Wt Readings from Last 1 Encounters:  04/12/15 128 lb 8.5 oz (58.3 kg)    Ideal Body Weight:  50 kg  BMI:  Body mass index is 23.5 kg/(m^2).  Estimated Nutritional Needs:   Kcal:  1300-1500  Protein:  70-80g  Fluid:  1.5L/day  EDUCATION NEEDS:   No education needs identified at this time  06/12/15, MS, RD, LDN Pager: [phone] After Hours Pager: [phone]

## 2015-04-14 NOTE — Progress Notes (Signed)
Swallow evaluation order received, will be completed next date.  Thanks for this order. Donavan Burnet, MS [facility] SLP [phone]

## 2015-04-14 NOTE — Progress Notes (Signed)
[facility] PROGRESS NOTE  Shelly [name] WUG:[id number] DOB: 1919/02/27 DOA: 04/12/2015 PCP: Cain Saupe, MD  Brief Summary  The patient is a 81 y.o. year-old female with history of dementia, atrial fibrillation and SVT, rheumatoid arthritis on chronic prednisone, iron deficiency anemia, CKD stage 4, ICH with residual left sided weakness who presents with recurrent falls. She lives at home with her daughter who cares for her. She ambulates with a rolling walker and does not always recognize her daughter or where she is. Over the last month, she has had progressive falls and weakness. She has been seen three times in the ER for falls, this fall being the worst. She was diagnosed with diverticulitis on 9/19 started on antibiotics which resolved her symptoms, however, her stools became soft a week ago. She is normally continent of urine, but about three days ago, she started to wet her depends at night. Family is unaware of any fevers. She had nausea with light yellow green emesis since yesterday. Family brought her in because she had an unwitnessed fall to a carpeted floor the night prior to admission which caused her to have stiffness and pain. She was unable to stand with her walker even with three person assist.   In the ER, her vital signs were notable for temperature of 102.2 Fahrenheit, tachycardic to 107, blood pressure stable, breathing comfortably on room air. Her white blood cell count was 13.2, hemoglobin 10.4, creatinine 1.65. Her urinalysis was positive with large leukocyte esterase, too numerous to count WBCs, many bacteria. Her chest x-ray was negative. She had CT head, cervical spine, x-ray of the hips, x-ray of the left knee, left femur, and lumbar spine which demonstrated no evidence of acute abnormality. Lactic acid and blood cultures have been ordered as well as 2 L bolus and ceftriaxone. She is being admitted for sepsis secondary to urinary tract  infection.   Assessment/Plan  Sepsis secondary to ESBL E. Coli urinary tract infection present at the time of admission - Blood cultures:  NGTD - d/c Ceftriaxone -  Start primaxin -  Will see if PICC team would be willing to place PICC line despite kidney function given pt's age, comorbidities, and goals of care. - Tylenol when necessary fever  Dementia, at risk for delirium.  - Frequent reorientation  Paroxysmal atrial fibrillation, rate controlled with intermittent NSR on telemetry - CHADs2vasc = 3 (minimum).  - HASBLED = 2 - Not on full anticoagulation due to age and dementia - Continue beta blocker and aspirin -  D/c telemetry  Rheumatoid arthritis, stable, continue prednisone  CKD stage IV, creatinine at baseline - Minimize nephrotoxicity and renally dose medications - Repeat creatinine in a.m.  - Resume lasix  Leukocytosis, secondary to sepsis from urinary tract infection, marginally improved  Iron deficiency anemia, hgb at baseline of 10. Continue iron supplementation  Recurrent falls, likely secondary to dementia and urinary tract infection - Falls precautions - PT/OT recommending SNF - Appreciate Social work assistance  Diet: Dysphagia 1 with thin  Access: PIV IVF: off Proph: lovenox  Code Status: DNR  Family Communication: patient and her daughter [name] Disposition Plan:  Pending improvement in white blood cell count, heart rate, possibly eating a little better, anticipate either discharge to home with home health services or possibly to skilled nursing facility if insurance will cover after [name] hospital stay  Consultants:  None  Procedures:  Chest x-ray  Lumbar spine x-ray  X-ray of the left knee, femur x-ray the pelvis  CT  of the head and cervical spine   Antibiotics: ceftriaxone 10/7 > 10/9 Imipenem 10/9 (day 1 of 14) >  HPI/Subjective:  Patient demented.  Denies pain, shortness of breath, nausea,  pain with urination, but states she does not feel good.    Objective: Filed Vitals:   04/13/15 0909 04/13/15 1451 04/13/15 2129 04/14/15 0613  BP:  121/76 126/59 119/60  Pulse: 105 93 97 100  Temp:  98.8 F (37.1 C) 99.6 F (37.6 C) 99.3 F (37.4 C)  TempSrc:  Oral Oral Oral  Resp:  Height:      Weight:      SpO2:  96% 96% 98%    Intake/Output Summary (Last 24 hours) at 04/14/15 1323 Last data filed at 04/13/15 2132  Gross per 24 hour  Intake     60 ml  Output      0 ml  Net     60 ml   Filed Weights   04/12/15 2031  Weight: 58.3 kg (128 lb 8.5 oz)   Body mass index is 23.5 kg/(m^2).  Exam:   General:  Adult female, no acute distress, lying in bed  HEENT:  NCAT, MMM  Cardiovascular:  IRRR, nl S1, S2 no mrg, 2+ pulses, warm extremities  Respiratory:  CTAB, no increased WOB  Abdomen:   NABS, soft, ND, TTP in the LLQ and suprapubic area without rebound or guarding  MSK:   Decreased tone and bulk, 1+ pitting bilateral LEE, swelling and ulnar deviation of the MCP joints bilaterally with significant joint deformities of fingers, knees, feet  Neuro:  Grossly intact  Data Reviewed: Basic Metabolic Panel:  Recent Labs Lab 04/12/15 1520 04/13/15 0510  NA 135 138  K 4.1 4.1  CL 101 107  CO2 24 21*  GLUCOSE 110* 87  BUN 23* 20  CREATININE 1.65* 1.30*  CALCIUM 8.9 8.5*   Liver Function Tests:  Recent Labs Lab 04/12/15 1520  AST 33  ALT 13*  ALKPHOS 40  BILITOT 1.2  PROT 8.3*  ALBUMIN 3.1*   No results for input(s): LIPASE, AMYLASE in the last 168 hours. No results for input(s): AMMONIA in the last 168 hours. CBC:  Recent Labs Lab 04/12/15 1520 04/13/15 0510  WBC 13.2* 12.2*  NEUTROABS 10.2*  --   HGB 10.4* 9.9*  HCT 30.1* 29.3*  MCV 70.8* 71.3*  PLT 338 342    Recent Results (from the past 240 hour(s))  Urine culture     Status: None   Collection Time: 04/12/15  4:26 PM  Result Value Ref Range Status   Specimen Description  URINE, CATHETERIZED  Final   Special Requests NONE  Final   Culture   Final    >=100,000 COLONIES/mL ESCHERICHIA COLI Confirmed Extended Spectrum Beta-Lactamase Producer (ESBL) Performed at [facility]    Report Status 04/14/2015 FINAL  Final   Organism ID, Bacteria ESCHERICHIA COLI  Final      Susceptibility   Escherichia coli - MIC*    AMPICILLIN >=32 RESISTANT Resistant     CEFAZOLIN >=64 RESISTANT Resistant     CEFTRIAXONE >=64 RESISTANT Resistant     CIPROFLOXACIN >=4 RESISTANT Resistant     GENTAMICIN >=16 RESISTANT Resistant     IMIPENEM <=0.25 SENSITIVE Sensitive     NITROFURANTOIN <=16 SENSITIVE Sensitive     TRIMETH/SULFA <=20 SENSITIVE Sensitive     AMPICILLIN/SULBACTAM >=32 RESISTANT Resistant     PIP/TAZO 8 SENSITIVE Sensitive     * >=100,000 COLONIES/mL  ESCHERICHIA COLI  Blood culture (routine x 2)     Status: None (Preliminary result)   Collection Time: 04/12/15  6:35 PM  Result Value Ref Range Status   Specimen Description BLOOD RIGHT ANTECUBITAL  Final   Special Requests BOTTLES DRAWN AEROBIC AND ANAEROBIC 2CC  Final   Culture   Final    NO GROWTH 2 DAYS Performed at [facility]    Report Status PENDING  Incomplete  Blood culture (routine x 2)     Status: None (Preliminary result)   Collection Time: 04/12/15  7:07 PM  Result Value Ref Range Status   Specimen Description BLOOD RIGHT WRIST  Final   Special Requests BOTTLES DRAWN AEROBIC AND ANAEROBIC 5CC  Final   Culture   Final    NO GROWTH 2 DAYS Performed at [facility]    Report Status PENDING  Incomplete     Studies: Dg Chest 2 View  04/12/2015   CLINICAL DATA:  Recent fall with chest pain, initial encounter  EXAM: CHEST - 2 VIEW  COMPARISON:  None.  FINDINGS: Cardiac shadow is within normal limits. Patchy fibrotic changes are noted throughout both lungs without focal infiltrate or sizable effusion. No acute bony abnormality is noted.  IMPRESSION: No acute abnormality noted.    Electronically Signed   By: Alcide Clever M.D.   On: 04/12/2015 14:57   Dg Lumbar Spine Complete  04/12/2015   CLINICAL DATA:  Fall last evening with low back pain, initial encounter  EXAM: LUMBAR SPINE - COMPLETE 4+ VIEW  COMPARISON:  None.  FINDINGS: Five lumbar type vertebral bodies are well visualized. There is compression deformity of L1 which appears chronic in nature although no prior exams are available for comparison. Correlation to point tenderness is recommended. Mild degenerative spondylolisthesis of L4 on L5 is seen. Scattered fecal material is noted throughout the colon. No other focal abnormality is noted.  IMPRESSION: Chronic appearing L1 compression deformity.  Degenerative anterolisthesis of L4 on L5.   Electronically Signed   By: Alcide Clever M.D.   On: 04/12/2015 14:59   Ct Head Wo Contrast  04/12/2015   CLINICAL DATA:  Advanced dementia.  Fall.  EXAM: CT HEAD WITHOUT CONTRAST  CT CERVICAL SPINE WITHOUT CONTRAST  TECHNIQUE: Multidetector CT imaging of the head and cervical spine was performed following the standard protocol without intravenous contrast. Multiplanar CT image reconstructions of the cervical spine were also generated.  COMPARISON:  None.  FINDINGS: CT HEAD FINDINGS  There is no evidence of mass effect, midline shift, or extra-axial fluid collections. There is no evidence of a space-occupying lesion or intracranial hemorrhage. There is no evidence of a cortical-based area of acute infarction. There is generalized cerebral atrophy. There is periventricular white matter low attenuation likely secondary to microangiopathy.  The ventricles and sulci are appropriate for the patient's age. The basal cisterns are patent.  Visualized portions of the orbits are unremarkable. The visualized portions of the paranasal sinuses and mastoid air cells are unremarkable. Cerebrovascular atherosclerotic calcifications are noted.  The osseous structures are unremarkable.  CT CERVICAL SPINE FINDINGS   The alignment is anatomic. The vertebral body heights are maintained. There is no acute fracture. There is 2 mm of anterolisthesis of C7 on T1 secondary to left facet arthropathy. The prevertebral soft tissues are normal. The intraspinal soft tissues are not fully imaged on this examination due to poor soft tissue contrast, but there is no gross soft tissue abnormality.  There is degenerative disc  disease at C3-4, C5-6, C6-7 and C7-T1. Bilateral uncovertebral degenerative changes and mild facet arthropathy at C3-4. Bilateral uncovertebral degenerative changes and bilateral facet arthropathy at C4-5. Mild broad-based disc osteophyte complex with bilateral uncovertebral degenerative changes and facet arthropathy at C5-C6 and C6-7.  The visualized portions of the lung apices demonstrate no focal abnormality. There is a 19 mm right thyroid nodule. 12 mm left thyroid nodule.  IMPRESSION: 1.  No acute intracranial pathology.  2.  No acute osseous injury of the cervical spine.  3.  Cervical spine spondylosis as described above.  4. 19 mm hypodense right thyroid nodule of indeterminate etiology. 12 mm left thyroid nodule of indeterminate etiology.   Electronically Signed   By: Elige Ko   On: 04/12/2015 15:13   Ct Cervical Spine Wo Contrast  04/12/2015   CLINICAL DATA:  Advanced dementia.  Fall.  EXAM: CT HEAD WITHOUT CONTRAST  CT CERVICAL SPINE WITHOUT CONTRAST  TECHNIQUE: Multidetector CT imaging of the head and cervical spine was performed following the standard protocol without intravenous contrast. Multiplanar CT image reconstructions of the cervical spine were also generated.  COMPARISON:  None.  FINDINGS: CT HEAD FINDINGS  There is no evidence of mass effect, midline shift, or extra-axial fluid collections. There is no evidence of a space-occupying lesion or intracranial hemorrhage. There is no evidence of a cortical-based area of acute infarction. There is generalized cerebral atrophy. There is  periventricular white matter low attenuation likely secondary to microangiopathy.  The ventricles and sulci are appropriate for the patient's age. The basal cisterns are patent.  Visualized portions of the orbits are unremarkable. The visualized portions of the paranasal sinuses and mastoid air cells are unremarkable. Cerebrovascular atherosclerotic calcifications are noted.  The osseous structures are unremarkable.  CT CERVICAL SPINE FINDINGS  The alignment is anatomic. The vertebral body heights are maintained. There is no acute fracture. There is 2 mm of anterolisthesis of C7 on T1 secondary to left facet arthropathy. The prevertebral soft tissues are normal. The intraspinal soft tissues are not fully imaged on this examination due to poor soft tissue contrast, but there is no gross soft tissue abnormality.  There is degenerative disc disease at C3-4, C5-6, C6-7 and C7-T1. Bilateral uncovertebral degenerative changes and mild facet arthropathy at C3-4. Bilateral uncovertebral degenerative changes and bilateral facet arthropathy at C4-5. Mild broad-based disc osteophyte complex with bilateral uncovertebral degenerative changes and facet arthropathy at C5-C6 and C6-7.  The visualized portions of the lung apices demonstrate no focal abnormality. There is a 19 mm right thyroid nodule. 12 mm left thyroid nodule.  IMPRESSION: 1.  No acute intracranial pathology.  2.  No acute osseous injury of the cervical spine.  3.  Cervical spine spondylosis as described above.  4. 19 mm hypodense right thyroid nodule of indeterminate etiology. 12 mm left thyroid nodule of indeterminate etiology.   Electronically Signed   By: Elige Ko   On: 04/12/2015 15:13   Dg Knee Complete 4 Views Left  04/12/2015   CLINICAL DATA:  Fall  EXAM: LEFT KNEE - COMPLETE 4+ VIEW  COMPARISON:  None.  FINDINGS: Total knee arthroplasty is anatomically aligned. No breakage or loosening of the hardware. No fracture. Osteopenia.  IMPRESSION: No acute  bony pathology.   Electronically Signed   By: Jolaine Click M.D.   On: 04/12/2015 14:46   Dg Hip Unilat With Pelvis 2-3 Views Left  04/12/2015   CLINICAL DATA:  Fall last evening with left hip pain, initial encounter  EXAM: DG HIP (WITH OR WITHOUT PELVIS) 2-3V LEFT  COMPARISON:  None.  FINDINGS: Pelvic ring is intact. No acute fracture or dislocation is noted. No gross soft tissue abnormality is seen. Calcified uterine fibroid is noted.  IMPRESSION: No acute bony abnormality seen.   Electronically Signed   By: Alcide Clever M.D.   On: 04/12/2015 14:46   Dg Femur Min 2 Views Left  04/12/2015   CLINICAL DATA:  Fall last evening with left leg pain, initial encounter  EXAM: LEFT FEMUR 2 VIEWS  COMPARISON:  None.  FINDINGS: Left knee prosthesis is noted. No acute fracture or dislocation is seen. No prosthetic loosening is noted. No soft tissue abnormality is seen.  IMPRESSION: No acute abnormality noted.   Electronically Signed   By: Alcide Clever M.D.   On: 04/12/2015 14:56    Scheduled Meds: . antiseptic oral rinse  7 mL Mouth Rinse BID  . aspirin EC  81 mg Oral Daily  . enoxaparin (LOVENOX) injection  30 mg Subcutaneous Q24H  . feeding supplement (ENSURE ENLIVE)  237 mL Oral BID BM  . ferrous sulfate  325 mg Oral Q breakfast  . imipenem-cilastatin  250 mg Intravenous 3 times per day  . metoprolol tartrate  25 mg Oral BID  . mirtazapine  15 mg Oral QHS  . pantoprazole  40 mg Oral Daily  . predniSONE  5 mg Oral Q breakfast  . sodium chloride  3 mL Intravenous Q12H   Continuous Infusions:   Principal Problem:   Sepsis (HCC) Active Problems:   UTI (lower urinary tract infection)   Dementia   Paroxysmal atrial fibrillation (HCC)   CKD (chronic kidney disease) stage 4, GFR 15-29 ml/min (HCC)   Iron deficiency anemia   Recurrent falls   Generalized weakness    Time spent: 30 min    Mickayla Trouten  [facility] Pager [phone]. If 7PM-7AM, please contact night-coverage at  [email or web address], password [facility] 04/14/2015, 1:23 PM  LOS: 2 days

## 2015-04-14 NOTE — Clinical Social Work Note (Signed)
CSW met with pt and her daughter/audrey at bedside to discuss discharge needs.  CSW provided a description of role and explained the SNF process.  Pt sleepy and asked CSW to speak to daughter.  CSW encouraged pt's daughter to provided history and needs.  CSW encouraged pt's daughter to explore thoughts related to pt rehab.   Pt's daughter stated that pt lives with her and she was "not interested in rehab.".    If needs change please consult CSW  .Dede Query, LCSW [facility] Clinical Social Worker - Weekend Coverage cell #: [phone]

## 2015-04-15 ENCOUNTER — Inpatient Hospital Stay (HOSPITAL_COMMUNITY): Payer: Medicare Other

## 2015-04-15 DIAGNOSIS — Z1612 Extended spectrum beta lactamase (ESBL) resistance: Secondary | ICD-10-CM

## 2015-04-15 DIAGNOSIS — A498 Other bacterial infections of unspecified site: Secondary | ICD-10-CM

## 2015-04-15 MED ORDER — HEPARIN SOD (PORK) LOCK FLUSH 100 UNIT/ML IV SOLN
250.0000 [IU] | Freq: Every day | INTRAVENOUS | Status: DC
  Administered 2015-04-16: 250 [IU]
  Filled 2015-04-15 (×2): qty 3

## 2015-04-15 MED ORDER — ENSURE ENLIVE PO LIQD: 237.0000 mL | Freq: Two times a day (BID) | ORAL | Status: DC

## 2015-04-15 MED ORDER — SACCHAROMYCES BOULARDII 250 MG PO CAPS: 250.0000 mg | ORAL_CAPSULE | Freq: Two times a day (BID) | ORAL | Status: DC

## 2015-04-15 MED ORDER — HEPARIN SOD (PORK) LOCK FLUSH 100 UNIT/ML IV SOLN
250.0000 [IU] | INTRAVENOUS | Status: DC | PRN
  Filled 2015-04-15: qty 3

## 2015-04-15 MED ORDER — SODIUM CHLORIDE 0.9 % IV SOLN: 250.0000 mg | Freq: Three times a day (TID) | INTRAVENOUS | Status: DC

## 2015-04-15 NOTE — Progress Notes (Signed)
Occupational Therapy Treatment Patient Details Name: Shelly [name] MRN: [id number] DOB: February 20, 1919 Today's Date: 04/15/2015    History of present illness The patient is a 81 y.o. year-old female with history of dementia, atrial fibrillation and SVT, rheumatoid arthritis on chronic prednisone, iron deficiency anemia, CKD stage 4, ICH with residual left sided weakness who presents with recurrent falls. She lives at home with her daughter who cares for her. She ambulates with a rolling walker and does not always recognize her daughter or where she is. Over the last month, she has had progressive falls and weakness. She has been seen three times in the ER for falls, this fall being the worst. She was diagnosed with diverticulitis on 9/19 started on antibiotics which resolved her symptoms, however, her stools became soft a week ago. She is normally continent of urine, but about three days ago, she started to wet her depends at night. Family is unaware of any fevers. She had nausea with light yellow green emesis since yesterday. Family brought her in because she had an unwitnessed fall to a carpeted floor the night prior to admission which caused her to have stiffness and pain. She was unable to stand with her walker even with three person assist   OT comments  Pt plans to DC home this day  Follow Up Recommendations  Home health OT;Supervision/Assistance - 24 hour;Other (comment)    Equipment Recommendations  Hospital bed;Other (comment)    Recommendations for Other Services      Precautions / Restrictions Precautions Precautions: Fall Precaution Comments: incontinence Restrictions Weight Bearing Restrictions: No       Mobility Bed Mobility            did not perform due to pain      Transfers                          ADL                                         General ADL Comments: At baseline pt has A for bathing,dressing, toileting but is  able to help by only needing minimal A for ambulation with RW   Spoke with daughter regarding ADL activity at home. Pt has a 3 n 1 and walker. Explained benefits of hospital bed in regards to bathing and dressing. Pts daughter ask that OT come back at a later time.  Pt is impending DC.  Explained HHOT recommendation for [facility] OT in the home.                            General Comments  Daughter states back pain is limiting pt but OT and daughter discussed DC plans ( HHOT)    Pertinent Vitals/ Pain       Pain Location: daughter reports back pain is limiting. Pt did just have pain meds Pain Intervention(s): Premedicated before session               Plan   HHOT      End of Session  in bed with daughter present   Activity Tolerance Patient limited by pain   Patient Left in bed;with family/visitor present           Time: 1130-1138 OT Time Calculation (min): 8 min  Charges: OT General Charges $OT Visit:  1 Procedure OT Treatments $Self Care/Home Management : 8-22 mins  Bennet Kujawa, [place]ung 04/15/2015, 1:01 PM

## 2015-04-15 NOTE — Evaluation (Addendum)
Clinical/Bedside Swallow Evaluation Patient Details  Name: Shelly [name] MRN: [id number] Date of Birth: June 26, 1919  Today's Date: 04/15/2015 Time: SLP Start Time (ACUTE ONLY): 0947 SLP Stop Time (ACUTE ONLY): 1002 SLP Time Calculation (min) (ACUTE ONLY): 15 min  Past Medical History:  Past Medical History  Diagnosis Date  . Dementia   . Rheumatoid arthritis (HCC)   . Atrial fibrillation (HCC)   . Recurrent falls   . ICH (intracerebral hemorrhage) (HCC)     stroke with residual left sided weakness  . CKD (chronic kidney disease), stage IV (HCC)     baseline creatinine now 1.6  . SVT (supraventricular tachycardia) (HCC)   . DVT (deep venous thrombosis) (HCC)     RLE   Past Surgical History:  Past Surgical History  Procedure Laterality Date  . Total abdominal hysterectomy w/ bilateral salpingoophorectomy    . Replacement total knee bilateral     HPI:  81 y.o. year-old female with history of dementia, atrial fibrillation and SVT, rheumatoid arthritis on chronic prednisone, iron deficiency anemia, CKD stage 4, ICH with residual left sided weakness who presents with recurrent falls, N/V. Admitted for sepsis secondary to UTI. CXR negative.    Assessment / Plan / Recommendation Clinical Impression  Bedside swallow evaluation complete. Patient with a functional appearing oropharyngeal swallow with pureed solids and thin liquids without overt indication of aspiration. With soft solids however, patient with excessive, perseverative mastication, requiring verbal and tactile cues (pureed bolus) to initiate swallow, likely related to dementia diagnosis. Daughter present and confirms this behavior at home impacting po consumption. Educated daughter regarding swallowing function as it relates to dementia diagnosis, increasing risk of aspiration as well as dehydration/malnutirion given length of time required for po intake. Dysphagia 2 solids appropriate at this time to maximize safe po intake. With  improved mentation, daughter may wish to liberalize to a dysphagia 3 diet after d/c, providing moistened solids as patient tolerates. Daughter in agreement. No further SLP needs indicated at this time.      Aspiration Risk  Mild    Diet Recommendation Dysphagia 2 (Fine chop);Thin   Medication Administration: Whole meds with liquid Compensations: Slow rate;Small sips/bites    Other  Recommendations Oral Care Recommendations: Oral care BID                 Swallow Study    General Other Pertinent Information: 81 y.o. year-old female with history of dementia, atrial fibrillation and SVT, rheumatoid arthritis on chronic prednisone, iron deficiency anemia, CKD stage 4, ICH with residual left sided weakness who presents with recurrent falls, N/V. Admitted for sepsis secondary to UTI. CXR negative.  Type of Study: Bedside swallow evaluation Previous Swallow Assessment: none noted in chart Diet Prior to this Study: Dysphagia 2 (chopped);Thin liquids Temperature Spikes Noted: Yes Respiratory Status: Room air History of Recent Intubation: No Behavior/Cognition: Alert;Cooperative;Pleasant mood;Other (Comment) (c/o back pain when positioned upright, RN aware per dtr) Oral Cavity - Dentition:  (upper and lower dentures) Self-Feeding Abilities: Able to feed self;Needs assist Patient Positioning: Upright in bed Baseline Vocal Quality: Hoarse (slightly increased from baseline per daughter) Volitional Cough: Weak (patient reports strong cough hurts back) Volitional Swallow: Able to elicit    Oral/Motor/Sensory Function Overall Oral Motor/Sensory Function: Appears within functional limits for tasks assessed   Ice Chips Ice chips: Not tested   Thin Liquid Thin Liquid: Within functional limits Presentation: Straw    Nectar Thick Nectar Thick Liquid: Not tested   Honey Thick Honey Thick Liquid: Not  tested   Puree Puree: Within functional limits Presentation: Spoon   Solid   GO    Solid:  Impaired Oral Phase Impairments: Impaired anterior to posterior transit Oral Phase Functional Implications: Other (comment) (perseverative mastication)      Zillah Alexie MA, CCC-SLP [phone]  Shelly [name] 04/15/2015,10:10 AM

## 2015-04-15 NOTE — Progress Notes (Signed)
[facility]  Patient Status: New  [facility] is providing the following services: RN and Home Infusion Services (teaching and education will be done by nurse in the home with patient and caregiver)  If patient discharges after hours, please call [phone].   Lanae Crumbly 04/15/2015, 2:43 PM

## 2015-04-15 NOTE — Discharge Summary (Signed)
Physician Discharge Summary  Shelly [name] QMG:[id number] DOB: 08-31-1918 DOA: 04/12/2015  PCP: Cain Saupe, MD  Admit date: 04/12/2015 Discharge date: 04/15/2015  Recommendations for Outpatient Follow-up:  1. Follow-up with primary care doctor in 1-2 weeks for repeat examination, and to discuss aggressive dementia 2. HH PT/OT/RN/aid 3. Primaxin through 10/16, then stop.  PICC line care per protocol by [facility] agency.  PICC line may be removed at end of therapy.    Discharge Diagnoses:  Principal Problem:   Sepsis (HCC) Active Problems:   UTI (lower urinary tract infection)   Dementia   Paroxysmal atrial fibrillation (HCC)   CKD (chronic kidney disease) stage 4, GFR 15-29 ml/min (HCC)   Iron deficiency anemia   Recurrent falls   Generalized weakness   Infection due to ESBL-producing Escherichia coli   Discharge Condition: Stable, improved  Diet recommendation: Dysphagia 2 with thin liquids  Wt Readings from Last 3 Encounters:  04/12/15 58.3 kg (128 lb 8.5 oz)    History of present illness:  The patient is a 81 year old female with history of dementia, atrial fibrillation and SVT, rheumatoid arthritis on chronic prednisone, iron deficiency anemia, chronic kidney disease stage IV, ICH with residual left-sided weakness who presented with recurrent falls. She lives at home with her daughter who is her primary caregiver. She normally ambulates with a rolling walker and does not always recognize her daughter or where she is. Over the last month, she had progressive falls and was seen in the emergency department several times. She was diagnosed with diverticulitis on 9/19 and started on antibiotics which improved her symptoms. She developed recurrent soft stools and urinary incontinence several days prior to admission. They brought her to the emergency department because of an unwitnessed fall on a carpeted floor the night prior which caused her to have persistent stiffness and pain. In the  emergency department, she had a fever to 102.2 Fahrenheit, tachycardic to 107, stable blood pressure. She had a mild leukocytosis in her urinalysis was consistent with urinary tract infection with large leukocyte esterase, too numerous to count to be BCs, and many bacteria. She had a chest x-ray which was negative, CT head, cervical spine, x-ray of the hips, x-ray of the left knee, left femur, and lumbar spine which were all negative for acute abnormalities.   Hospital Course:   Sepsis secondary to ESBL Escherichia coli urinary tract infection, present at the time of admission.  She had fever, tachycardia, leukocytosis.  She was initially started on ceftriaxone, however her urine culture grew ESBL Escherichia coli. She was transitioned to imipenem on 10/9 late in the day. She needs a total of 7 days of IV antibiotics to treat this infection. She is not a candidate for fosfomycin secondary to sepsis at admission. She was seen by physical therapy who recommended skilled nursing facility for ongoing therapy, however the family preferred to take her home. She is being set up with home health services, including a nurse to assist with PICC line management, and home antibiotics.  Started on florastor to reduce risk of C. Diff colitis.    Dementia, at risk for delirium, however she remained fairly calm during her hospital stay.  We spoke about her progressive weight loss, difficulty with swallowing, recurrent infections and visits to the hospital. The family was given information about end-stage dementia and advised to make some decisions regarding goals of care as I see that she may have progressive failure to thrive over the next 6 months to a year.  Paroxysmal  atrial fibrillation with CHADs2vasc = 3,  she is no longer on anticoagulation secondary to age, dementia. She continued her beta blocker and aspirin. Telemetry demonstrated normal sinus rhythm.  Rheumatoid arthritis, stable, continued prednisone and  hydrocodone.  Chronic kidney disease stage IV, creatinine remained at baseline.  Leukocytosis, secondary to sepsis from urinary tract infection and gradually improved.  Iron deficiency anemia, hemoglobin at baseline of 10. She continued iron supplementation.  Recurrent falls secondary to dementia and urinary tract infection. She was seen by physical therapy who recommended skilled nursing facility placement. The family declined skilled nursing facility and preferred to take her home. She was set up for home health services.  Mild tachypnea, chest x-ray remained stable.  Consultants:  None  Procedures:  Chest x-ray  Lumbar spine x-ray  X-ray of the left knee, femur x-ray the pelvis  CT of the head and cervical spine  Antibiotics: ceftriaxone 10/7 > 10/9 Imipenem 10/9 (day 1 of 14) >  Discharge Exam: Filed Vitals:   04/15/15 0532  BP: 132/46  Pulse: 85  Temp: 98.2 F (36.8 C)  Resp: 20   Filed Vitals:   04/14/15 0613 04/14/15 1434 04/14/15 2144 04/15/15 0532  BP: 119/60 113/61 144/71 132/46  Pulse: 100 82  85  Temp: 99.3 F (37.4 C) 99.8 F (37.7 C) 98.8 F (37.1 C) 98.2 F (36.8 C)  TempSrc: Oral Oral Oral Oral  Resp: 20 18 18 20   Height:      Weight:      SpO2: 98% 96% 99% 93%     General: Adult female, no acute distress, lying in bed  HEENT: NCAT, MMM  Cardiovascular: RRR, nl S1, S2 no mrg, 2+ pulses, warm extremities  Respiratory: CTAB, no increased WOB  Abdomen: NABS, soft, ND, nontender   MSK: Decreased tone and bulk, 1+ pitting bilateral LEE, swelling and ulnar deviation of the MCP joints bilaterally with significant joint deformities of fingers, knees, feet  Neuro: Grossly intact  Discharge Instructions      Discharge Instructions    Call MD for:  difficulty breathing, headache or visual disturbances    Complete by:  As directed      Call MD for:  extreme fatigue    Complete by:  As directed      Call MD for:  hives     Complete by:  As directed      Call MD for:  persistant dizziness or light-headedness    Complete by:  As directed      Call MD for:  persistant nausea and vomiting    Complete by:  As directed      Call MD for:  severe uncontrolled pain    Complete by:  As directed      Call MD for:  temperature >100.4    Complete by:  As directed      Discharge instructions    Complete by:  As directed   Shelly [name] was hospitalized with urinary tract infection that was resistant to many antibiotics.  She will need 7 days of IV antibiotics to treat this infection.  She is at risk for infectious diarrhea caused by repeated courses of antibiotics.  If she develops diarrhea anytime in the next three months, please seek immediate medical attention to have testing for infectious diarrhea which requires treatment with antibiotics.  Have her take a probiotic such as florastor for the next three months to reduce her chance of developing diarrhea.     Increase activity slowly  Complete by:  As directed             Medication List    TAKE these medications        aspirin 81 MG tablet  Take 81 mg by mouth daily.     docusate sodium 100 MG capsule  Commonly known as:  COLACE  Take 100 mg by mouth daily as needed for mild constipation.     feeding supplement (ENSURE ENLIVE) Liqd  Take 237 mLs by mouth 2 (two) times daily between meals.     ferrous sulfate 325 (65 FE) MG tablet  Take 325 mg by mouth daily with breakfast.     furosemide 20 MG tablet  Commonly known as:  LASIX  Take 20 mg by mouth daily.     HYDROcodone-acetaminophen 5-325 MG tablet  Commonly known as:  NORCO/VICODIN  Take 1 tablet by mouth every 8 (eight) hours as needed for moderate pain.     imipenem-cilastatin 250 mg in sodium chloride 0.9 % 100 mL  Inject 250 mg into the vein every 8 (eight) hours.     magnesium oxide 400 MG tablet  Commonly known as:  MAG-OX  Take 400 mg by mouth daily.     metoprolol tartrate 25 MG tablet   Commonly known as:  LOPRESSOR  Take 25 mg by mouth 2 (two) times daily.     mirtazapine 15 MG tablet  Commonly known as:  REMERON  Take 15 mg by mouth at bedtime.     pantoprazole 40 MG tablet  Commonly known as:  PROTONIX  Take 40 mg by mouth daily.     predniSONE 5 MG tablet  Commonly known as:  DELTASONE  Take 5 mg by mouth daily with breakfast.     saccharomyces boulardii 250 MG capsule  Commonly known as:  FLORASTOR  Take 1 capsule (250 mg total) by mouth 2 (two) times daily.       Follow-up Information    Follow up with FULP, CAMMIE, MD. Schedule an appointment as soon as possible for a visit in 1 week.   Specialty:  Family Medicine   Contact information:   [address] [place] [address] [phone]        The results of significant diagnostics from this hospitalization (including imaging, microbiology, ancillary and laboratory) are listed below for reference.    Significant Diagnostic Studies: Dg Chest 2 View  04/12/2015   CLINICAL DATA:  Recent fall with chest pain, initial encounter  EXAM: CHEST - 2 VIEW  COMPARISON:  None.  FINDINGS: Cardiac shadow is within normal limits. Patchy fibrotic changes are noted throughout both lungs without focal infiltrate or sizable effusion. No acute bony abnormality is noted.  IMPRESSION: No acute abnormality noted.   Electronically Signed   By: Alcide Clever M.D.   On: 04/12/2015 14:57   Dg Lumbar Spine Complete  04/12/2015   CLINICAL DATA:  Fall last evening with low back pain, initial encounter  EXAM: LUMBAR SPINE - COMPLETE 4+ VIEW  COMPARISON:  None.  FINDINGS: Five lumbar type vertebral bodies are well visualized. There is compression deformity of L1 which appears chronic in nature although no prior exams are available for comparison. Correlation to point tenderness is recommended. Mild degenerative spondylolisthesis of L4 on L5 is seen. Scattered fecal material is noted throughout the colon. No other focal abnormality  is noted.  IMPRESSION: Chronic appearing L1 compression deformity.  Degenerative anterolisthesis of L4 on L5.   Electronically Signed  By: Alcide Clever M.D.   On: 04/12/2015 14:59   Ct Head Wo Contrast  04/12/2015   CLINICAL DATA:  Advanced dementia.  Fall.  EXAM: CT HEAD WITHOUT CONTRAST  CT CERVICAL SPINE WITHOUT CONTRAST  TECHNIQUE: Multidetector CT imaging of the head and cervical spine was performed following the standard protocol without intravenous contrast. Multiplanar CT image reconstructions of the cervical spine were also generated.  COMPARISON:  None.  FINDINGS: CT HEAD FINDINGS  There is no evidence of mass effect, midline shift, or extra-axial fluid collections. There is no evidence of a space-occupying lesion or intracranial hemorrhage. There is no evidence of a cortical-based area of acute infarction. There is generalized cerebral atrophy. There is periventricular white matter low attenuation likely secondary to microangiopathy.  The ventricles and sulci are appropriate for the patient's age. The basal cisterns are patent.  Visualized portions of the orbits are unremarkable. The visualized portions of the paranasal sinuses and mastoid air cells are unremarkable. Cerebrovascular atherosclerotic calcifications are noted.  The osseous structures are unremarkable.  CT CERVICAL SPINE FINDINGS  The alignment is anatomic. The vertebral body heights are maintained. There is no acute fracture. There is 2 mm of anterolisthesis of C7 on T1 secondary to left facet arthropathy. The prevertebral soft tissues are normal. The intraspinal soft tissues are not fully imaged on this examination due to poor soft tissue contrast, but there is no gross soft tissue abnormality.  There is degenerative disc disease at C3-4, C5-6, C6-7 and C7-T1. Bilateral uncovertebral degenerative changes and mild facet arthropathy at C3-4. Bilateral uncovertebral degenerative changes and bilateral facet arthropathy at C4-5. Mild  broad-based disc osteophyte complex with bilateral uncovertebral degenerative changes and facet arthropathy at C5-C6 and C6-7.  The visualized portions of the lung apices demonstrate no focal abnormality. There is a 19 mm right thyroid nodule. 12 mm left thyroid nodule.  IMPRESSION: 1.  No acute intracranial pathology.  2.  No acute osseous injury of the cervical spine.  3.  Cervical spine spondylosis as described above.  4. 19 mm hypodense right thyroid nodule of indeterminate etiology. 12 mm left thyroid nodule of indeterminate etiology.   Electronically Signed   By: Elige Ko   On: 04/12/2015 15:13   Ct Cervical Spine Wo Contrast  04/12/2015   CLINICAL DATA:  Advanced dementia.  Fall.  EXAM: CT HEAD WITHOUT CONTRAST  CT CERVICAL SPINE WITHOUT CONTRAST  TECHNIQUE: Multidetector CT imaging of the head and cervical spine was performed following the standard protocol without intravenous contrast. Multiplanar CT image reconstructions of the cervical spine were also generated.  COMPARISON:  None.  FINDINGS: CT HEAD FINDINGS  There is no evidence of mass effect, midline shift, or extra-axial fluid collections. There is no evidence of a space-occupying lesion or intracranial hemorrhage. There is no evidence of a cortical-based area of acute infarction. There is generalized cerebral atrophy. There is periventricular white matter low attenuation likely secondary to microangiopathy.  The ventricles and sulci are appropriate for the patient's age. The basal cisterns are patent.  Visualized portions of the orbits are unremarkable. The visualized portions of the paranasal sinuses and mastoid air cells are unremarkable. Cerebrovascular atherosclerotic calcifications are noted.  The osseous structures are unremarkable.  CT CERVICAL SPINE FINDINGS  The alignment is anatomic. The vertebral body heights are maintained. There is no acute fracture. There is 2 mm of anterolisthesis of C7 on T1 secondary to left facet  arthropathy. The prevertebral soft tissues are normal. The intraspinal soft tissues  are not fully imaged on this examination due to poor soft tissue contrast, but there is no gross soft tissue abnormality.  There is degenerative disc disease at C3-4, C5-6, C6-7 and C7-T1. Bilateral uncovertebral degenerative changes and mild facet arthropathy at C3-4. Bilateral uncovertebral degenerative changes and bilateral facet arthropathy at C4-5. Mild broad-based disc osteophyte complex with bilateral uncovertebral degenerative changes and facet arthropathy at C5-C6 and C6-7.  The visualized portions of the lung apices demonstrate no focal abnormality. There is a 19 mm right thyroid nodule. 12 mm left thyroid nodule.  IMPRESSION: 1.  No acute intracranial pathology.  2.  No acute osseous injury of the cervical spine.  3.  Cervical spine spondylosis as described above.  4. 19 mm hypodense right thyroid nodule of indeterminate etiology. 12 mm left thyroid nodule of indeterminate etiology.   Electronically Signed   By: Elige Ko   On: 04/12/2015 15:13   Dg Chest Port 1 View  04/15/2015   CLINICAL DATA:  Follow-up tachypnea  EXAM: PORTABLE CHEST 1 VIEW  COMPARISON:  04/12/2015  FINDINGS: Right PICC line is in place with the tip in the SVC. Heart is borderline in size. Increased markings throughout the lungs, likely scarring. No confluent airspace opacities or effusions. No acute bony abnormality.  IMPRESSION: Chronic changes in the lungs, likely scarring.  No active disease.   Electronically Signed   By: Charlett Nose M.D.   On: 04/15/2015 09:38   Dg Knee Complete 4 Views Left  04/12/2015   CLINICAL DATA:  Fall  EXAM: LEFT KNEE - COMPLETE 4+ VIEW  COMPARISON:  None.  FINDINGS: Total knee arthroplasty is anatomically aligned. No breakage or loosening of the hardware. No fracture. Osteopenia.  IMPRESSION: No acute bony pathology.   Electronically Signed   By: Jolaine Click M.D.   On: 04/12/2015 14:46   Dg Hip Unilat With  Pelvis 2-3 Views Left  04/12/2015   CLINICAL DATA:  Fall last evening with left hip pain, initial encounter  EXAM: DG HIP (WITH OR WITHOUT PELVIS) 2-3V LEFT  COMPARISON:  None.  FINDINGS: Pelvic ring is intact. No acute fracture or dislocation is noted. No gross soft tissue abnormality is seen. Calcified uterine fibroid is noted.  IMPRESSION: No acute bony abnormality seen.   Electronically Signed   By: Alcide Clever M.D.   On: 04/12/2015 14:46   Dg Femur Min 2 Views Left  04/12/2015   CLINICAL DATA:  Fall last evening with left leg pain, initial encounter  EXAM: LEFT FEMUR 2 VIEWS  COMPARISON:  None.  FINDINGS: Left knee prosthesis is noted. No acute fracture or dislocation is seen. No prosthetic loosening is noted. No soft tissue abnormality is seen.  IMPRESSION: No acute abnormality noted.   Electronically Signed   By: Alcide Clever M.D.   On: 04/12/2015 14:56    Microbiology: Recent Results (from the past 240 hour(s))  Urine culture     Status: None   Collection Time: 04/12/15  4:26 PM  Result Value Ref Range Status   Specimen Description URINE, CATHETERIZED  Final   Special Requests NONE  Final   Culture   Final    >=100,000 COLONIES/mL ESCHERICHIA COLI Confirmed Extended Spectrum Beta-Lactamase Producer (ESBL) Performed at [facility]    Report Status 04/14/2015 FINAL  Final   Organism ID, Bacteria ESCHERICHIA COLI  Final      Susceptibility   Escherichia coli - MIC*    AMPICILLIN >=32 RESISTANT Resistant  CEFAZOLIN >=64 RESISTANT Resistant     CEFTRIAXONE >=64 RESISTANT Resistant     CIPROFLOXACIN >=4 RESISTANT Resistant     GENTAMICIN >=16 RESISTANT Resistant     IMIPENEM <=0.25 SENSITIVE Sensitive     NITROFURANTOIN <=16 SENSITIVE Sensitive     TRIMETH/SULFA <=20 SENSITIVE Sensitive     AMPICILLIN/SULBACTAM >=32 RESISTANT Resistant     PIP/TAZO 8 SENSITIVE Sensitive     * >=100,000 COLONIES/mL ESCHERICHIA COLI  Blood culture (routine x 2)     Status: None  (Preliminary result)   Collection Time: 04/12/15  6:35 PM  Result Value Ref Range Status   Specimen Description BLOOD RIGHT ANTECUBITAL  Final   Special Requests BOTTLES DRAWN AEROBIC AND ANAEROBIC 2CC  Final   Culture   Final    NO GROWTH 2 DAYS Performed at [facility]    Report Status PENDING  Incomplete  Blood culture (routine x 2)     Status: None (Preliminary result)   Collection Time: 04/12/15  7:07 PM  Result Value Ref Range Status   Specimen Description BLOOD RIGHT WRIST  Final   Special Requests BOTTLES DRAWN AEROBIC AND ANAEROBIC 5CC  Final   Culture   Final    NO GROWTH 2 DAYS Performed at [facility]    Report Status PENDING  Incomplete     Labs: Basic Metabolic Panel:  Recent Labs Lab 04/12/15 1520 04/13/15 0510  NA 135 138  K 4.1 4.1  CL 101 107  CO2 24 21*  GLUCOSE 110* 87  BUN 23* 20  CREATININE 1.65* 1.30*  CALCIUM 8.9 8.5*   Liver Function Tests:  Recent Labs Lab 04/12/15 1520  AST 33  ALT 13*  ALKPHOS 40  BILITOT 1.2  PROT 8.3*  ALBUMIN 3.1*   No results for input(s): LIPASE, AMYLASE in the last 168 hours. No results for input(s): AMMONIA in the last 168 hours. CBC:  Recent Labs Lab 04/12/15 1520 04/13/15 0510  WBC 13.2* 12.2*  NEUTROABS 10.2*  --   HGB 10.4* 9.9*  HCT 30.1* 29.3*  MCV 70.8* 71.3*  PLT 338 342   Cardiac Enzymes: No results for input(s): CKTOTAL, CKMB, CKMBINDEX, TROPONINI in the last 168 hours. BNP: BNP (last 3 results) No results for input(s): BNP in the last 8760 hours.  ProBNP (last 3 results) No results for input(s): PROBNP in the last 8760 hours.  CBG: No results for input(s): GLUCAP in the last 168 hours.  Time coordinating discharge: 35 minutes  Signed:  Arabelle Bollig  [facility] 04/15/2015, 10:13 AM

## 2015-04-15 NOTE — Progress Notes (Signed)
Pt planned for dc to home today with IV antibiotics. [facility] unable to provide RN to administer today's doses of IV antibiotics. Pt will plan to dc in am.  MD notified via text page. Maeola Harman

## 2015-04-15 NOTE — Progress Notes (Signed)
[facility] called, asked to arrive to transport approx 1 PM. CSW signing off, please call if needed.  Santa Genera, LCSW Clinical Social Worker

## 2015-04-15 NOTE — Progress Notes (Signed)
Spoke with dtg [name] at bedside concerning [facility] and discharge needs. Daughter selected [facility] and asked for RW. Referral given to in house rep.

## 2015-04-15 NOTE — Progress Notes (Signed)
EMS cancelled, CSW will stand by for clearance for discharge.  Santa Genera, LCSW Clinical Social Worker

## 2015-04-15 NOTE — Progress Notes (Signed)
CSW met w daughter, Luellen Pucker at bedside.  Per Luellen Pucker, family and patient are declining SNF placement as recommended, daughter plans to take patient home at discharge.  Asked CSW to arrange EMS transport when needed.  CSW signing off re SNF placement, will assist w EMS at discharge.    Edwyna Shell, LCSW Clinical Social Worker

## 2015-04-16 MED ORDER — HEPARIN SOD (PORK) LOCK FLUSH 100 UNIT/ML IV SOLN
250.0000 [IU] | INTRAVENOUS | Status: AC | PRN
  Administered 2015-04-16: 250 [IU]

## 2015-04-16 NOTE — Progress Notes (Signed)
Patient seen and examined.  She has no complaints today except that she feels cold.  VSS.  No abdominal pain on exam.  Plan to discharge to home to continue a week of IV antibiotics for ESBL UTI.

## 2015-04-16 NOTE — Care Management Note (Signed)
Case Management Note  Patient Details  Name: Shelly [name] MRN: [id number] Date of Birth: [birth year]  Subjective/Objective:     [facility] IV liason Pam aware of HHC orders,teaching done.dme rw to be delivered to rm if not already there.Nsg aware.               Action/Plan:d/c home w/HHC/dme.   Expected Discharge Date:                  Expected Discharge Plan:  Home w Home Health Services  In-House Referral:     Discharge planning Services  CM Consult  Post Acute Care Choice:    Choice offered to:     DME Arranged:  Walker rolling DME Agency:     HH Arranged:  RN, PT, IV Antibiotics HH Agency:  [facility]  Status of Service:  Completed, signed off  Medicare Important Message Given:    Date Medicare IM Given:    Medicare IM give by:    Date Additional Medicare IM Given:    Additional Medicare Important Message give by:     If discussed at Long Length of Stay Meetings, dates discussed:    Additional Comments:  Lanier Clam, RN 04/16/2015, 9:58 AM

## 2015-04-17 ENCOUNTER — Encounter (HOSPITAL_COMMUNITY): Payer: Self-pay | Admitting: Emergency Medicine

## 2015-04-17 LAB — CULTURE, BLOOD (ROUTINE X 2)
Culture: NO GROWTH
Culture: NO GROWTH

## 2015-04-23 ENCOUNTER — Ambulatory Visit: Payer: Medicare Other | Admitting: Interventional Cardiology

## 2015-05-27 ENCOUNTER — Emergency Department (HOSPITAL_COMMUNITY): Payer: Medicare Other

## 2015-05-27 ENCOUNTER — Inpatient Hospital Stay (HOSPITAL_COMMUNITY)
Admission: EM | Admit: 2015-05-27 | Discharge: 2015-06-02 | DRG: 682 | Disposition: A | Discharge status: Home Health | Payer: Medicare Other | Attending: Internal Medicine | Admitting: Internal Medicine

## 2015-05-27 ENCOUNTER — Encounter (HOSPITAL_COMMUNITY): Payer: Self-pay

## 2015-05-27 DIAGNOSIS — Z7982 Long term (current) use of aspirin: Secondary | ICD-10-CM

## 2015-05-27 DIAGNOSIS — Z6825 Body mass index (BMI) 25.0-25.9, adult: Secondary | ICD-10-CM

## 2015-05-27 DIAGNOSIS — K5732 Diverticulitis of large intestine without perforation or abscess without bleeding: Secondary | ICD-10-CM | POA: Diagnosis present

## 2015-05-27 DIAGNOSIS — R296 Repeated falls: Secondary | ICD-10-CM

## 2015-05-27 DIAGNOSIS — I4891 Unspecified atrial fibrillation: Secondary | ICD-10-CM | POA: Diagnosis present

## 2015-05-27 DIAGNOSIS — Z96653 Presence of artificial knee joint, bilateral: Secondary | ICD-10-CM | POA: Diagnosis present

## 2015-05-27 DIAGNOSIS — I129 Hypertensive chronic kidney disease with stage 1 through stage 4 chronic kidney disease, or unspecified chronic kidney disease: Secondary | ICD-10-CM | POA: Diagnosis present

## 2015-05-27 DIAGNOSIS — Z90710 Acquired absence of both cervix and uterus: Secondary | ICD-10-CM

## 2015-05-27 DIAGNOSIS — T501X5A Adverse effect of loop [high-ceiling] diuretics, initial encounter: Secondary | ICD-10-CM | POA: Diagnosis not present

## 2015-05-27 DIAGNOSIS — I472 Ventricular tachycardia: Secondary | ICD-10-CM | POA: Diagnosis present

## 2015-05-27 DIAGNOSIS — Z7952 Long term (current) use of systemic steroids: Secondary | ICD-10-CM

## 2015-05-27 DIAGNOSIS — K5792 Diverticulitis of intestine, part unspecified, without perforation or abscess without bleeding: Secondary | ICD-10-CM

## 2015-05-27 DIAGNOSIS — N179 Acute kidney failure, unspecified: Secondary | ICD-10-CM | POA: Diagnosis present

## 2015-05-27 DIAGNOSIS — K5641 Fecal impaction: Secondary | ICD-10-CM | POA: Diagnosis present

## 2015-05-27 DIAGNOSIS — R532 Functional quadriplegia: Secondary | ICD-10-CM | POA: Diagnosis present

## 2015-05-27 DIAGNOSIS — Z7189 Other specified counseling: Secondary | ICD-10-CM | POA: Diagnosis present

## 2015-05-27 DIAGNOSIS — Z888 Allergy status to other drugs, medicaments and biological substances status: Secondary | ICD-10-CM

## 2015-05-27 DIAGNOSIS — Z79899 Other long term (current) drug therapy: Secondary | ICD-10-CM

## 2015-05-27 DIAGNOSIS — I480 Paroxysmal atrial fibrillation: Secondary | ICD-10-CM | POA: Diagnosis present

## 2015-05-27 DIAGNOSIS — D649 Anemia, unspecified: Secondary | ICD-10-CM | POA: Diagnosis present

## 2015-05-27 DIAGNOSIS — M4856XA Collapsed vertebra, not elsewhere classified, lumbar region, initial encounter for fracture: Secondary | ICD-10-CM | POA: Diagnosis present

## 2015-05-27 DIAGNOSIS — I69328 Other speech and language deficits following cerebral infarction: Secondary | ICD-10-CM

## 2015-05-27 DIAGNOSIS — Z86711 Personal history of pulmonary embolism: Secondary | ICD-10-CM

## 2015-05-27 DIAGNOSIS — R109 Unspecified abdominal pain: Secondary | ICD-10-CM | POA: Diagnosis present

## 2015-05-27 DIAGNOSIS — N184 Chronic kidney disease, stage 4 (severe): Secondary | ICD-10-CM | POA: Diagnosis present

## 2015-05-27 DIAGNOSIS — E870 Hyperosmolality and hypernatremia: Secondary | ICD-10-CM | POA: Diagnosis not present

## 2015-05-27 DIAGNOSIS — Z961 Presence of intraocular lens: Secondary | ICD-10-CM | POA: Diagnosis present

## 2015-05-27 DIAGNOSIS — I10 Essential (primary) hypertension: Secondary | ICD-10-CM | POA: Diagnosis present

## 2015-05-27 DIAGNOSIS — D72829 Elevated white blood cell count, unspecified: Secondary | ICD-10-CM | POA: Diagnosis present

## 2015-05-27 DIAGNOSIS — X58XXXA Exposure to other specified factors, initial encounter: Secondary | ICD-10-CM | POA: Diagnosis present

## 2015-05-27 DIAGNOSIS — D631 Anemia in chronic kidney disease: Secondary | ICD-10-CM | POA: Diagnosis present

## 2015-05-27 DIAGNOSIS — E876 Hypokalemia: Secondary | ICD-10-CM | POA: Diagnosis present

## 2015-05-27 DIAGNOSIS — N189 Chronic kidney disease, unspecified: Secondary | ICD-10-CM

## 2015-05-27 DIAGNOSIS — I69351 Hemiplegia and hemiparesis following cerebral infarction affecting right dominant side: Secondary | ICD-10-CM

## 2015-05-27 DIAGNOSIS — E860 Dehydration: Secondary | ICD-10-CM | POA: Diagnosis present

## 2015-05-27 DIAGNOSIS — M069 Rheumatoid arthritis, unspecified: Secondary | ICD-10-CM | POA: Diagnosis present

## 2015-05-27 DIAGNOSIS — Z515 Encounter for palliative care: Secondary | ICD-10-CM | POA: Diagnosis present

## 2015-05-27 DIAGNOSIS — I69359 Hemiplegia and hemiparesis following cerebral infarction affecting unspecified side: Secondary | ICD-10-CM

## 2015-05-27 DIAGNOSIS — G9340 Encephalopathy, unspecified: Secondary | ICD-10-CM | POA: Diagnosis present

## 2015-05-27 DIAGNOSIS — F0390 Unspecified dementia without behavioral disturbance: Secondary | ICD-10-CM | POA: Diagnosis present

## 2015-05-27 DIAGNOSIS — R531 Weakness: Secondary | ICD-10-CM

## 2015-05-27 DIAGNOSIS — F419 Anxiety disorder, unspecified: Secondary | ICD-10-CM | POA: Diagnosis present

## 2015-05-27 DIAGNOSIS — E440 Moderate protein-calorie malnutrition: Secondary | ICD-10-CM | POA: Diagnosis present

## 2015-05-27 DIAGNOSIS — K219 Gastro-esophageal reflux disease without esophagitis: Secondary | ICD-10-CM | POA: Diagnosis present

## 2015-05-27 DIAGNOSIS — Z66 Do not resuscitate: Secondary | ICD-10-CM | POA: Diagnosis present

## 2015-05-27 LAB — CBC WITH DIFFERENTIAL/PLATELET
Basophils Absolute: 0 10*3/uL (ref 0.0–0.1)
Basophils Relative: 0 %
Eosinophils Absolute: 0.1 10*3/uL (ref 0.0–0.7)
Eosinophils Relative: 1 %
HCT: 35.8 % — ABNORMAL LOW (ref 36.0–46.0)
Hemoglobin: 12.1 g/dL (ref 12.0–15.0)
Lymphocytes Relative: 12 %
Lymphs Abs: 2 10*3/uL (ref 0.7–4.0)
MCH: 24.2 pg — ABNORMAL LOW (ref 26.0–34.0)
MCHC: 33.8 g/dL (ref 30.0–36.0)
MCV: 71.7 fL — ABNORMAL LOW (ref 78.0–100.0)
Monocytes Absolute: 1.5 10*3/uL — ABNORMAL HIGH (ref 0.1–1.0)
Monocytes Relative: 9 %
Neutro Abs: 13.7 10*3/uL — ABNORMAL HIGH (ref 1.7–7.7)
Neutrophils Relative %: 78 %
Platelets: 373 10*3/uL (ref 150–400)
RBC: 4.99 MIL/uL (ref 3.87–5.11)
RDW: 16.3 % — ABNORMAL HIGH (ref 11.5–15.5)
WBC: 17.4 10*3/uL — ABNORMAL HIGH (ref 4.0–10.5)

## 2015-05-27 LAB — COMPREHENSIVE METABOLIC PANEL
ALT: 11 U/L — ABNORMAL LOW (ref 14–54)
AST: 27 U/L (ref 15–41)
Albumin: 3 g/dL — ABNORMAL LOW (ref 3.5–5.0)
Alkaline Phosphatase: 87 U/L (ref 38–126)
Anion gap: 11 (ref 5–15)
BUN: 50 mg/dL — ABNORMAL HIGH (ref 6–20)
CO2: 27 mmol/L (ref 22–32)
Calcium: 9.6 mg/dL (ref 8.9–10.3)
Chloride: 109 mmol/L (ref 101–111)
Creatinine, Ser: 2.63 mg/dL — ABNORMAL HIGH (ref 0.44–1.00)
GFR calc Af Amer: 17 mL/min — ABNORMAL LOW (ref >60)
GFR calc non Af Amer: 15 mL/min — ABNORMAL LOW (ref >60)
Glucose, Bld: 111 mg/dL — ABNORMAL HIGH (ref 65–99)
Potassium: 4 mmol/L (ref 3.5–5.1)
Sodium: 147 mmol/L — ABNORMAL HIGH (ref 135–145)
Total Bilirubin: 0.9 mg/dL (ref 0.3–1.2)
Total Protein: 9.2 g/dL — ABNORMAL HIGH (ref 6.5–8.1)

## 2015-05-27 LAB — LIPASE, BLOOD: Lipase: 31 U/L (ref 11–51)

## 2015-05-27 MED ORDER — SODIUM CHLORIDE 0.9 % IV BOLUS (SEPSIS)
500.0000 mL | Freq: Once | INTRAVENOUS | Status: AC
  Administered 2015-05-27: 500 mL via INTRAVENOUS

## 2015-05-27 MED ORDER — CIPROFLOXACIN IN D5W 400 MG/200ML IV SOLN
400.0000 mg | Freq: Once | INTRAVENOUS | Status: AC
  Administered 2015-05-27: 400 mg via INTRAVENOUS
  Filled 2015-05-27: qty 200

## 2015-05-27 MED ORDER — METRONIDAZOLE IN NACL 5-0.79 MG/ML-% IV SOLN
500.0000 mg | Freq: Once | INTRAVENOUS | Status: AC
  Administered 2015-05-28: 500 mg via INTRAVENOUS
  Filled 2015-05-27: qty 100

## 2015-05-27 NOTE — ED Provider Notes (Signed)
CSN: [id number]     Arrival date & time 05/27/15  0962 History   First MD Initiated Contact with Patient 05/27/15 2011     Chief Complaint  Patient presents with  . Altered Mental Status  . Weakness      Patient is a 81 y.o. female presenting with altered mental status and weakness. The history is provided by a caregiver. No language interpreter was used.  Altered Mental Status Associated symptoms: weakness   Weakness   Shelly [name] is a 81 y.o. female who presents to the Emergency Department complaining of abdominal pain, altered mental status.  Level V caveat due to dementia.  Hx is provided by the patient's daughter.  Family reported that she had progressive weakness and worsening confusion over the last week with increased leaning to the right side. At baseline she is minimally verbal and is in a wheelchair. They report vomiting since yesterday with apparent abdominal pain today. No fevers, no diarrhea. Family reports decreased urinary output, last urinated last night.   Past Medical History  Diagnosis Date  . Hypertension   . Bronchitis     "not chronic; not recurrent" (11/13/2013)  . Chronic anemia   . GERD (gastroesophageal reflux disease)   . Pulmonary embolism (HCC)   . DVT (deep venous thrombosis) (HCC)     "RLE?"  . Stroke (HCC)     11/13/2013 "she has a slow bleed"  . Chronic kidney disease (CKD), stage II (mild)     Stage II, creatine clearance 40-50 cc /minute  . Dementia   . Rheumatoid arthritis (HCC)   . Atrial fibrillation (HCC)   . Recurrent falls   . ICH (intracerebral hemorrhage) (HCC)     stroke with residual left sided weakness  . CKD (chronic kidney disease), stage IV (HCC)     baseline creatinine now 1.6  . SVT (supraventricular tachycardia) (HCC)   . DVT (deep venous thrombosis) (HCC)     RLE   Past Surgical History  Procedure Laterality Date  . Joint replacement    . Tubal ligation    . Carpal tunnel release    . Dilation and curettage of  uterus    . Cataract extraction w/ intraocular lens  implant, bilateral Bilateral   . Total abdominal hysterectomy w/ bilateral salpingoophorectomy    . Replacement total knee bilateral     Family History  Problem Relation Age of Onset  . Hypertension Daughter   . High blood pressure    . Cancer Neg Hx   . Thyroid nodules     Social History  Substance Use Topics  . Smoking status: Never Smoker   . Smokeless tobacco: None  . Alcohol Use: No   OB History    Gravida Para Term Preterm AB TAB SAB Ectopic Multiple Living   0 0 0 0 0 0 0 0       Review of Systems  Neurological: Positive for weakness.  All other systems reviewed and are negative.     Allergies  Aricept  Home Medications   Prior to Admission medications   Medication Sig Start Date End Date Taking? Authorizing Provider  ALPRAZolam (XANAX) 0.25 MG tablet Take 0.125 mg by mouth 2 (two) times daily as needed for anxiety.   Yes Historical Provider, MD  aspirin EC 81 MG tablet Take 1 tablet (81 mg total) by mouth daily. 11/28/13  Yes Evlyn Kanner Love, PA-C  docusate sodium (COLACE) 100 MG capsule Take 100 mg by mouth daily  as needed for mild constipation.   Yes Historical Provider, MD  feeding supplement, ENSURE ENLIVE, (ENSURE ENLIVE) LIQD Take 237 mLs by mouth 2 (two) times daily between meals. 04/15/15  Yes Renae Fickle, MD  ferrous sulfate 325 (65 FE) MG tablet Take 325 mg by mouth daily with breakfast.   Yes Historical Provider, MD  furosemide (LASIX) 20 MG tablet Take 1 tablet (20 mg total) by mouth daily. 02/08/15  Yes Dione Booze, MD  HYDROcodone-acetaminophen (NORCO/VICODIN) 5-325 MG per tablet Take 1-2 tablets by mouth every 8 (eight) hours as needed. Severe pain. 03/04/15  Yes Historical Provider, MD  magnesium oxide (MAG-OX) 400 (241.3 MG) MG tablet Take 1 tablet (400 mg total) by mouth daily. 12/14/13  Yes Christiane Ha, MD  metoprolol tartrate (LOPRESSOR) 25 MG tablet Take 1 tablet (25 mg total) by mouth 2  (two) times daily. 12/14/13  Yes Christiane Ha, MD  mirtazapine (REMERON) 15 MG tablet Take 15 mg by mouth at bedtime.   Yes Historical Provider, MD  pantoprazole (PROTONIX) 40 MG tablet Take 40 mg by mouth daily.   Yes Historical Provider, MD  predniSONE (DELTASONE) 5 MG tablet Take 5 mg by mouth every morning.    Yes Historical Provider, MD  ciprofloxacin (CIPRO) 500 MG tablet Take 1 tablet (500 mg total) by mouth 2 (two) times daily. Patient not taking: Reported on 05/27/2015 03/25/15   Laurence Spates, MD  imipenem-cilastatin 250 mg in sodium chloride 0.9 % 100 mL Inject 250 mg into the vein every 8 (eight) hours. Patient not taking: Reported on 05/27/2015 04/15/15   Renae Fickle, MD  metroNIDAZOLE (FLAGYL) 500 MG tablet Take 1 tablet (500 mg total) by mouth 2 (two) times daily. Patient not taking: Reported on 05/27/2015 03/25/15   Laurence Spates, MD  saccharomyces boulardii (FLORASTOR) 250 MG capsule Take 1 capsule (250 mg total) by mouth 2 (two) times daily. Patient not taking: Reported on 05/27/2015 04/15/15   Renae Fickle, MD  senna-docusate (SENOKOT-S) 8.6-50 MG per tablet Take 1 tablet by mouth 2 (two) times daily. Available over the counter Patient not taking: Reported on 03/25/2015 11/28/13   Evlyn Kanner Love, PA-C   There were no vitals taken for this visit. Physical Exam  Constitutional: She appears well-developed and well-nourished.  HENT:  Head: Normocephalic and atraumatic.  Cardiovascular: Regular rhythm.   No murmur heard. Tachycardic  Pulmonary/Chest: Effort normal and breath sounds normal. No respiratory distress.  Abdominal: Soft. There is no rebound and no guarding.  Moderate diffuse abdominal tenderness  Musculoskeletal: She exhibits no edema or tenderness.  Neurological: She is alert.  Minimally verbal. Profound generalized weakness. Intermittently will answer yes or no to simple questions.  Skin: Skin is warm and dry.  Psychiatric: She has a  normal mood and affect. Her behavior is normal.  Nursing note and vitals reviewed.   ED Course  Procedures (including critical care time) Labs Review Labs Reviewed  COMPREHENSIVE METABOLIC PANEL - Abnormal; Notable for the following:    Sodium 147 (*)    Glucose, Bld 111 (*)    BUN 50 (*)    Creatinine, Ser 2.63 (*)    Total Protein 9.2 (*)    Albumin 3.0 (*)    ALT 11 (*)    GFR calc non Af Amer 15 (*)    GFR calc Af Amer 17 (*)    All other components within normal limits  CBC WITH DIFFERENTIAL/PLATELET - Abnormal; Notable for the following:  WBC 17.4 (*)    HCT 35.8 (*)    MCV 71.7 (*)    MCH 24.2 (*)    RDW 16.3 (*)    Neutro Abs 13.7 (*)    Monocytes Absolute 1.5 (*)    All other components within normal limits  URINE CULTURE  LIPASE, BLOOD  URINALYSIS, ROUTINE W REFLEX MICROSCOPIC (NOT AT [facility])    Imaging Review Ct Abdomen Pelvis Wo Contrast  05/27/2015  CLINICAL DATA:  Subacute onset of worsening confusion, and leaning toward the right side. Nausea and vomiting. Diffuse right-sided abdominal pain. Initial encounter. EXAM: CT ABDOMEN AND PELVIS WITHOUT CONTRAST TECHNIQUE: Multidetector CT imaging of the abdomen and pelvis was performed following the standard protocol without IV contrast. COMPARISON:  CT of the abdomen and pelvis performed 03/25/2015 FINDINGS: Bibasilar opacities may reflect chronic scarring, as they are similar in appearance to the prior study. A tiny hiatal hernia is noted. The liver and spleen are unremarkable in appearance. The gallbladder is within normal limits. The pancreas and adrenal glands are unremarkable. A 1.8 cm cyst is noted at the interpole region of the right kidney. The kidneys are otherwise unremarkable. There is no evidence of hydronephrosis. No renal or ureteral stones are seen. No perinephric stranding is appreciated. The small bowel is unremarkable in appearance. The stomach is otherwise within normal limits. No acute vascular  abnormalities are seen. Scattered calcification is noted along the abdominal aorta and its branches. The appendix is normal in caliber and contains air, without evidence for appendicitis. Focal wall thickening is noted along the proximal sigmoid colon, with surrounding soft tissue inflammation and trace free fluid, compatible with acute diverticulitis. Scattered diverticulosis is noted along the sigmoid colon. Dense stool is noted filling the rectum, measuring up to 8.1 cm in transverse dimension, concerning for fecal impaction. The bladder is moderately distended and grossly unremarkable. A calcified uterine fibroid is noted. The uterus is otherwise unremarkable. The ovaries are relatively symmetric. No suspicious adnexal masses are seen. No inguinal lymphadenopathy is seen. No acute osseous abnormalities are identified. Compression deformities are noted at L1 and L2, new at L2 since the prior study. IMPRESSION: 1. Acute diverticulitis at the proximal sigmoid colon, with focal wall thickening, surrounding soft tissue inflammation and trace free fluid. No evidence of perforation or abscess formation. Follow-up colonoscopy could be considered after completion of treatment, to exclude an underlying mass, as deemed clinically appropriate. 2. Dense stool noted filling the rectum, measuring up to 8.1 cm in transverse dimension, concerning for fecal impaction. 3. Scattered diverticulosis along the sigmoid colon. 4. Scattered calcification along the abdominal aorta and its branches. 5. Chronic scarring again noted at the lung bases. 6. Calcified uterine fibroid seen. 7. Tiny hiatal hernia seen. 8. New compression deformity at L2, and chronic compression deformity at L1. Electronically Signed   By: Roanna Raider M.D.   On: 05/27/2015 23:11   Dg Chest Port 1 View  05/27/2015  CLINICAL DATA:  Vomiting and weakness. EXAM: PORTABLE CHEST 1 VIEW COMPARISON:  03/1915 FINDINGS: Cardiac enlargement without definite vascular  congestion. Diffuse bronchial wall thickening with central interstitial fibrosis likely due to chronic bronchitis. Mild bronchiectasis in the lower lobes. Superimposed opacity on the left lung base suggesting superimposed consolidation. This may be due to pneumonia or atelectasis. No pneumothorax. No blunting of costophrenic angles. IMPRESSION: Chronic fibrosis, bronchiectasis, and chronic bronchitic changes in the lungs. Superimposed opacity in the left lung base may represent pneumonia or atelectasis. Electronically Signed   By: Chrissie Noa  Andria Meuse M.D.   On: 05/27/2015 22:02   I have personally reviewed and evaluated these images and lab results as part of my medical decision-making.   EKG Interpretation None      MDM   Final diagnoses:  Acute kidney injury (HCC)  Acute diverticulitis    Patient with history of dementia here with abdominal pain, vomiting. Labs demonstrated acute on chronic kidney injury, CT scan with acute diverticulitis. Providing IV fluids, antibiotics. Discussed with family serious nature of illness and need for admission for further treatment. Hospitalist consult for admission for further management.  Tilden Fossa, MD 05/28/15 [id number]

## 2015-05-27 NOTE — ED Notes (Signed)
Patient's daughter reports patient has had intermittent vomiting x several days - primarily after eating.  Daughter states patient has had decreased PO intake and constipation recently.  Daughter reports patient will normally answer questions with only a yes or no response at baseline but has not been responding to questions over the last 24 hours.  Patient is alert and responds to some questions and commands during assessment.

## 2015-05-27 NOTE — ED Notes (Signed)
Bed: [facility] Expected date:  Expected time:  Means of arrival:  Comments: EMS 81 yo female

## 2015-05-27 NOTE — ED Notes (Signed)
Hx of dementia. From home. Per EMS, over the last couple of weeks she's been declining into one word conversations and increased confusion. Leaning more towards right side x 1 week. Family states she has not seemed like herself x 1 week. 1 episode of nausea and vomiting prior to EMS arrival. Upper and lower abdominal pain towards the right side of the abdomin.

## 2015-05-28 DIAGNOSIS — Z79899 Other long term (current) drug therapy: Secondary | ICD-10-CM | POA: Diagnosis not present

## 2015-05-28 DIAGNOSIS — I10 Essential (primary) hypertension: Secondary | ICD-10-CM

## 2015-05-28 DIAGNOSIS — D649 Anemia, unspecified: Secondary | ICD-10-CM | POA: Diagnosis present

## 2015-05-28 DIAGNOSIS — I129 Hypertensive chronic kidney disease with stage 1 through stage 4 chronic kidney disease, or unspecified chronic kidney disease: Secondary | ICD-10-CM | POA: Diagnosis present

## 2015-05-28 DIAGNOSIS — N184 Chronic kidney disease, stage 4 (severe): Secondary | ICD-10-CM | POA: Diagnosis present

## 2015-05-28 DIAGNOSIS — F0390 Unspecified dementia without behavioral disturbance: Secondary | ICD-10-CM

## 2015-05-28 DIAGNOSIS — X58XXXA Exposure to other specified factors, initial encounter: Secondary | ICD-10-CM | POA: Diagnosis present

## 2015-05-28 DIAGNOSIS — Z888 Allergy status to other drugs, medicaments and biological substances status: Secondary | ICD-10-CM | POA: Diagnosis not present

## 2015-05-28 DIAGNOSIS — I4891 Unspecified atrial fibrillation: Secondary | ICD-10-CM

## 2015-05-28 DIAGNOSIS — Z6825 Body mass index (BMI) 25.0-25.9, adult: Secondary | ICD-10-CM | POA: Diagnosis not present

## 2015-05-28 DIAGNOSIS — E876 Hypokalemia: Secondary | ICD-10-CM | POA: Diagnosis not present

## 2015-05-28 DIAGNOSIS — D72829 Elevated white blood cell count, unspecified: Secondary | ICD-10-CM | POA: Diagnosis present

## 2015-05-28 DIAGNOSIS — G9340 Encephalopathy, unspecified: Secondary | ICD-10-CM | POA: Diagnosis present

## 2015-05-28 DIAGNOSIS — I69959 Hemiplegia and hemiparesis following unspecified cerebrovascular disease affecting unspecified side: Secondary | ICD-10-CM

## 2015-05-28 DIAGNOSIS — Z7982 Long term (current) use of aspirin: Secondary | ICD-10-CM | POA: Diagnosis not present

## 2015-05-28 DIAGNOSIS — I69359 Hemiplegia and hemiparesis following cerebral infarction affecting unspecified side: Secondary | ICD-10-CM

## 2015-05-28 DIAGNOSIS — K5641 Fecal impaction: Secondary | ICD-10-CM | POA: Diagnosis present

## 2015-05-28 DIAGNOSIS — R532 Functional quadriplegia: Secondary | ICD-10-CM | POA: Diagnosis present

## 2015-05-28 DIAGNOSIS — N179 Acute kidney failure, unspecified: Secondary | ICD-10-CM | POA: Diagnosis present

## 2015-05-28 DIAGNOSIS — E440 Moderate protein-calorie malnutrition: Secondary | ICD-10-CM | POA: Diagnosis present

## 2015-05-28 DIAGNOSIS — T501X5A Adverse effect of loop [high-ceiling] diuretics, initial encounter: Secondary | ICD-10-CM | POA: Diagnosis not present

## 2015-05-28 DIAGNOSIS — Z96653 Presence of artificial knee joint, bilateral: Secondary | ICD-10-CM | POA: Diagnosis present

## 2015-05-28 DIAGNOSIS — I69351 Hemiplegia and hemiparesis following cerebral infarction affecting right dominant side: Secondary | ICD-10-CM | POA: Diagnosis not present

## 2015-05-28 DIAGNOSIS — E860 Dehydration: Secondary | ICD-10-CM | POA: Diagnosis present

## 2015-05-28 DIAGNOSIS — K219 Gastro-esophageal reflux disease without esophagitis: Secondary | ICD-10-CM | POA: Diagnosis present

## 2015-05-28 DIAGNOSIS — R296 Repeated falls: Secondary | ICD-10-CM | POA: Diagnosis present

## 2015-05-28 DIAGNOSIS — R1084 Generalized abdominal pain: Secondary | ICD-10-CM | POA: Diagnosis not present

## 2015-05-28 DIAGNOSIS — Z961 Presence of intraocular lens: Secondary | ICD-10-CM | POA: Diagnosis present

## 2015-05-28 DIAGNOSIS — K5792 Diverticulitis of intestine, part unspecified, without perforation or abscess without bleeding: Secondary | ICD-10-CM | POA: Diagnosis not present

## 2015-05-28 DIAGNOSIS — M069 Rheumatoid arthritis, unspecified: Secondary | ICD-10-CM | POA: Diagnosis present

## 2015-05-28 DIAGNOSIS — Z7189 Other specified counseling: Secondary | ICD-10-CM | POA: Diagnosis present

## 2015-05-28 DIAGNOSIS — I480 Paroxysmal atrial fibrillation: Secondary | ICD-10-CM

## 2015-05-28 DIAGNOSIS — Z7952 Long term (current) use of systemic steroids: Secondary | ICD-10-CM | POA: Diagnosis not present

## 2015-05-28 DIAGNOSIS — M4856XA Collapsed vertebra, not elsewhere classified, lumbar region, initial encounter for fracture: Secondary | ICD-10-CM | POA: Diagnosis present

## 2015-05-28 DIAGNOSIS — Z86711 Personal history of pulmonary embolism: Secondary | ICD-10-CM | POA: Diagnosis not present

## 2015-05-28 DIAGNOSIS — Z515 Encounter for palliative care: Secondary | ICD-10-CM | POA: Diagnosis present

## 2015-05-28 DIAGNOSIS — I472 Ventricular tachycardia: Secondary | ICD-10-CM

## 2015-05-28 DIAGNOSIS — E870 Hyperosmolality and hypernatremia: Secondary | ICD-10-CM | POA: Diagnosis present

## 2015-05-28 DIAGNOSIS — R1031 Right lower quadrant pain: Secondary | ICD-10-CM | POA: Diagnosis not present

## 2015-05-28 DIAGNOSIS — F419 Anxiety disorder, unspecified: Secondary | ICD-10-CM | POA: Diagnosis present

## 2015-05-28 DIAGNOSIS — Z66 Do not resuscitate: Secondary | ICD-10-CM | POA: Diagnosis present

## 2015-05-28 DIAGNOSIS — I69328 Other speech and language deficits following cerebral infarction: Secondary | ICD-10-CM | POA: Diagnosis not present

## 2015-05-28 DIAGNOSIS — K5732 Diverticulitis of large intestine without perforation or abscess without bleeding: Secondary | ICD-10-CM

## 2015-05-28 DIAGNOSIS — Z90710 Acquired absence of both cervix and uterus: Secondary | ICD-10-CM | POA: Diagnosis not present

## 2015-05-28 DIAGNOSIS — D631 Anemia in chronic kidney disease: Secondary | ICD-10-CM | POA: Diagnosis present

## 2015-05-28 LAB — URINALYSIS, ROUTINE W REFLEX MICROSCOPIC
Glucose, UA: NEGATIVE mg/dL
Ketones, ur: NEGATIVE mg/dL
Nitrite: NEGATIVE
Protein, ur: NEGATIVE mg/dL
Specific Gravity, Urine: 1.016 (ref 1.005–1.030)
pH: 7 (ref 5.0–8.0)

## 2015-05-28 LAB — CBC
HCT: 30.1 % — ABNORMAL LOW (ref 36.0–46.0)
Hemoglobin: 10 g/dL — ABNORMAL LOW (ref 12.0–15.0)
MCH: 23.9 pg — ABNORMAL LOW (ref 26.0–34.0)
MCHC: 33.2 g/dL (ref 30.0–36.0)
MCV: 72 fL — ABNORMAL LOW (ref 78.0–100.0)
Platelets: 321 10*3/uL (ref 150–400)
RBC: 4.18 MIL/uL (ref 3.87–5.11)
RDW: 16.2 % — ABNORMAL HIGH (ref 11.5–15.5)
WBC: 13.3 10*3/uL — ABNORMAL HIGH (ref 4.0–10.5)

## 2015-05-28 LAB — BASIC METABOLIC PANEL
Anion gap: 10 (ref 5–15)
BUN: 42 mg/dL — ABNORMAL HIGH (ref 6–20)
CO2: 23 mmol/L (ref 22–32)
Calcium: 8.7 mg/dL — ABNORMAL LOW (ref 8.9–10.3)
Chloride: 114 mmol/L — ABNORMAL HIGH (ref 101–111)
Creatinine, Ser: 2.28 mg/dL — ABNORMAL HIGH (ref 0.44–1.00)
GFR calc Af Amer: 20 mL/min — ABNORMAL LOW (ref >60)
GFR calc non Af Amer: 17 mL/min — ABNORMAL LOW (ref >60)
Glucose, Bld: 97 mg/dL (ref 65–99)
Potassium: 3.4 mmol/L — ABNORMAL LOW (ref 3.5–5.1)
Sodium: 147 mmol/L — ABNORMAL HIGH (ref 135–145)

## 2015-05-28 LAB — URINE MICROSCOPIC-ADD ON

## 2015-05-28 LAB — MRSA PCR SCREENING: MRSA by PCR: POSITIVE — AB

## 2015-05-28 LAB — MAGNESIUM: Magnesium: 2.1 mg/dL (ref 1.7–2.4)

## 2015-05-28 LAB — PHOSPHORUS: Phosphorus: 3.1 mg/dL (ref 2.5–4.6)

## 2015-05-28 MED ORDER — SODIUM CHLORIDE 0.9 % IV SOLN
INTRAVENOUS | Status: DC
  Administered 2015-05-28: 75 mL/h via INTRAVENOUS
  Administered 2015-05-28 (×2): via INTRAVENOUS
  Administered 2015-05-29: 75 mL/h via INTRAVENOUS

## 2015-05-28 MED ORDER — CHLORHEXIDINE GLUCONATE CLOTH 2 % EX PADS
6.0000 | MEDICATED_PAD | Freq: Every day | CUTANEOUS | Status: AC
  Administered 2015-05-28 – 2015-06-01 (×4): 6 via TOPICAL

## 2015-05-28 MED ORDER — MIRTAZAPINE 15 MG PO TABS
15.0000 mg | ORAL_TABLET | Freq: Every day | ORAL | Status: DC
  Administered 2015-06-01: 15 mg via ORAL
  Filled 2015-05-28 (×6): qty 1

## 2015-05-28 MED ORDER — HYDROMORPHONE HCL 1 MG/ML IJ SOLN: 1.0000 mg | INTRAMUSCULAR | Status: DC | PRN

## 2015-05-28 MED ORDER — SODIUM CHLORIDE 0.9 % IJ SOLN
3.0000 mL | Freq: Two times a day (BID) | INTRAMUSCULAR | Status: DC
Start: 2015-05-28 — End: 2015-06-02
  Administered 2015-05-28 – 2015-06-01 (×3): 3 mL via INTRAVENOUS

## 2015-05-28 MED ORDER — METOPROLOL TARTRATE 1 MG/ML IV SOLN
2.5000 mg | Freq: Four times a day (QID) | INTRAVENOUS | Status: DC
  Administered 2015-05-28 – 2015-06-02 (×20): 2.5 mg via INTRAVENOUS
  Filled 2015-05-28 (×26): qty 5

## 2015-05-28 MED ORDER — FLEET ENEMA 7-19 GM/118ML RE ENEM
1.0000 | ENEMA | Freq: Once | RECTAL | Status: AC
  Administered 2015-05-28: 1 via RECTAL
  Filled 2015-05-28: qty 1

## 2015-05-28 MED ORDER — DOCUSATE SODIUM 100 MG PO CAPS
100.0000 mg | ORAL_CAPSULE | Freq: Every day | ORAL | Status: DC
  Administered 2015-05-29 – 2015-06-01 (×3): 100 mg via ORAL
  Filled 2015-05-28 (×6): qty 1

## 2015-05-28 MED ORDER — PANTOPRAZOLE SODIUM 40 MG PO TBEC
40.0000 mg | DELAYED_RELEASE_TABLET | Freq: Every day | ORAL | Status: DC
  Administered 2015-05-29 – 2015-06-02 (×5): 40 mg via ORAL
  Filled 2015-05-28 (×7): qty 1

## 2015-05-28 MED ORDER — CIPROFLOXACIN IN D5W 400 MG/200ML IV SOLN
400.0000 mg | Freq: Every day | INTRAVENOUS | Status: DC
  Administered 2015-05-28 – 2015-06-01 (×5): 400 mg via INTRAVENOUS
  Filled 2015-05-28 (×6): qty 200

## 2015-05-28 MED ORDER — ALPRAZOLAM 0.25 MG PO TABS: 0.1250 mg | ORAL_TABLET | Freq: Two times a day (BID) | ORAL | Status: DC | PRN

## 2015-05-28 MED ORDER — MAGNESIUM CITRATE PO SOLN: 1.0000 | Freq: Once | ORAL | Status: DC | PRN

## 2015-05-28 MED ORDER — HEPARIN SODIUM (PORCINE) 5000 UNIT/ML IJ SOLN
5000.0000 [IU] | Freq: Three times a day (TID) | INTRAMUSCULAR | Status: DC
  Administered 2015-05-28 – 2015-05-29 (×4): 5000 [IU] via SUBCUTANEOUS
  Filled 2015-05-28 (×7): qty 1

## 2015-05-28 MED ORDER — ONDANSETRON HCL 4 MG PO TABS: 4.0000 mg | ORAL_TABLET | Freq: Four times a day (QID) | ORAL | Status: DC | PRN

## 2015-05-28 MED ORDER — MUPIROCIN 2 % EX OINT
TOPICAL_OINTMENT | Freq: Two times a day (BID) | CUTANEOUS | Status: AC
  Administered 2015-05-28: 1 via NASAL
  Administered 2015-05-29: 09:00:00 via NASAL
  Administered 2015-05-29: 1 via NASAL
  Administered 2015-05-30 – 2015-06-01 (×5): via NASAL
  Administered 2015-06-01: 1 via NASAL
  Administered 2015-06-02: 10:00:00 via NASAL
  Filled 2015-05-28: qty 22

## 2015-05-28 MED ORDER — ASPIRIN EC 81 MG PO TBEC
81.0000 mg | DELAYED_RELEASE_TABLET | Freq: Every day | ORAL | Status: DC
Start: 2015-05-28 — End: 2015-06-02
  Administered 2015-05-29 – 2015-06-02 (×5): 81 mg via ORAL
  Filled 2015-05-28 (×6): qty 1

## 2015-05-28 MED ORDER — ONDANSETRON HCL 4 MG/2ML IJ SOLN: 4.0000 mg | Freq: Four times a day (QID) | INTRAMUSCULAR | Status: DC | PRN

## 2015-05-28 MED ORDER — HYDROCODONE-ACETAMINOPHEN 5-325 MG PO TABS
1.0000 | ORAL_TABLET | Freq: Four times a day (QID) | ORAL | Status: DC | PRN
  Administered 2015-05-28: 1 via ORAL
  Filled 2015-05-28: qty 1

## 2015-05-28 MED ORDER — METRONIDAZOLE IN NACL 5-0.79 MG/ML-% IV SOLN
500.0000 mg | Freq: Three times a day (TID) | INTRAVENOUS | Status: DC
  Administered 2015-05-28 – 2015-06-02 (×16): 500 mg via INTRAVENOUS
  Filled 2015-05-28 (×17): qty 100

## 2015-05-28 MED ORDER — PREDNISONE 5 MG PO TABS
5.0000 mg | ORAL_TABLET | Freq: Every morning | ORAL | Status: DC
  Administered 2015-05-28 – 2015-06-02 (×6): 5 mg via ORAL
  Filled 2015-05-28 (×7): qty 1
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
## 2015-05-28 NOTE — Care Management Note (Signed)
Case Management Note  Patient Details  Name: Shelly [name] MRN: [id number] Date of Birth: [birth year]  Subjective/Objective: 81 y/o f admitted w/Diverticulitis. From home.                   Action/Plan:d/c plan home.   Expected Discharge Date:                  Expected Discharge Plan:  Home/Self Care  In-House Referral:     Discharge planning Services  CM Consult  Post Acute Care Choice:    Choice offered to:     DME Arranged:    DME Agency:     HH Arranged:    HH Agency:     Status of Service:  In process, will continue to follow  Medicare Important Message Given:    Date Medicare IM Given:    Medicare IM give by:    Date Additional Medicare IM Given:    Additional Medicare Important Message give by:     If discussed at Long Length of Stay Meetings, dates discussed:    Additional Comments:  Lanier Clam, RN 05/28/2015, 12:18 PM

## 2015-05-28 NOTE — Progress Notes (Signed)
PT Cancellation Note  Patient Details Name: Shelly [name] MRN: [id number] DOB: 11-11-1918   Cancelled Treatment:    Reason Eval/Treat Not Completed: Other (comment) (Pt on strict bedrest.) Please update activity level for pt to be able to participate in PT/OT.   Lavarius Doughten,KATHrine E 05/28/2015, 8:49 AM Zenovia Jarred, PT, DPT 05/28/2015 Pager: [phone]

## 2015-05-28 NOTE — Progress Notes (Signed)
CRITICAL VALUE ALERT  Critical value received:  + MRSA PCR  Date of notification:  05-28-15  Time of notification:  1556  Critical value read back: yes  Nurse who received alert:  Silvano Bilis  MD notified (1st page):  Dr. Elisabeth Pigeon  Time of first page:  1556  MD notified (2nd page):  Time of second page:  Responding MD:  Dr. Elisabeth Pigeon  Time MD responded:  Per protocol orders

## 2015-05-28 NOTE — ED Notes (Signed)
Patient is having short periods of v-tach on monitor, lasting no more than 3 - 5 seconds.  Patient remains alert with pulses present during episodes.  Consulting MD aware.

## 2015-05-28 NOTE — Progress Notes (Signed)
Pt admitted after midnight so please refer to admission done 05/28/2015. Pt admitted for abdominal pain. She was found to have acute diverticulitis.  Assessment and Plan:  Acute diverticulitis  - Continue cipro and flagyl - Continue clear liquid diet   Manson Passey [facility] [phone]

## 2015-05-28 NOTE — Progress Notes (Signed)
ANTIBIOTIC CONSULT NOTE - INITIAL  Pharmacy Consult for Cipro Indication: Diverticulitis  Allergies  Allergen Reactions  . Aricept [Donepezil Hcl] Nausea And Vomiting    She becomes violently ill per daughter.    Patient Measurements:   Wt=58 kg  Vital Signs: Temp: 99.4 F (37.4 C) (11/21 2227) Temp Source: Rectal (11/21 2227) BP: 125/85 mmHg (11/22 0300) Pulse Rate: 41 (11/22 0000) Intake/Output from previous day:   Intake/Output from this shift:    Labs:  Recent Labs  05/27/15 2226  WBC 17.4*  HGB 12.1  PLT 373  CREATININE 2.63*   CrCl cannot be calculated (Unknown ideal weight.). No results for input(s): VANCOTROUGH, VANCOPEAK, VANCORANDOM, GENTTROUGH, GENTPEAK, GENTRANDOM, TOBRATROUGH, TOBRAPEAK, TOBRARND, AMIKACINPEAK, AMIKACINTROU, AMIKACIN in the last 72 hours.   Microbiology: No results found for this or any previous visit (from the past 720 hour(s)).  Medical History: Past Medical History  Diagnosis Date  . Hypertension   . Bronchitis     "not chronic; not recurrent" (11/13/2013)  . Chronic anemia   . GERD (gastroesophageal reflux disease)   . Pulmonary embolism (HCC)   . DVT (deep venous thrombosis) (HCC)     "RLE?"  . Stroke (HCC)     11/13/2013 "she has a slow bleed"  . Chronic kidney disease (CKD), stage II (mild)     Stage II, creatine clearance 40-50 cc /minute  . Dementia   . Rheumatoid arthritis (HCC)   . Atrial fibrillation (HCC)   . Recurrent falls   . ICH (intracerebral hemorrhage) (HCC)     stroke with residual left sided weakness  . CKD (chronic kidney disease), stage IV (HCC)     baseline creatinine now 1.6  . SVT (supraventricular tachycardia) (HCC)   . DVT (deep venous thrombosis) (HCC)     RLE    Medications:  Prescriptions prior to admission  Medication Sig Dispense Refill Last Dose  . ALPRAZolam (XANAX) 0.25 MG tablet Take 0.125 mg by mouth 2 (two) times daily as needed for anxiety.   05/27/2015 at Unknown time  .  aspirin EC 81 MG tablet Take 1 tablet (81 mg total) by mouth daily.   05/27/2015 at Unknown time  . docusate sodium (COLACE) 100 MG capsule Take 100 mg by mouth daily as needed for mild constipation.   05/27/2015 at Unknown time  . feeding supplement, ENSURE ENLIVE, (ENSURE ENLIVE) LIQD Take 237 mLs by mouth 2 (two) times daily between meals. 237 mL 0 05/26/2015 at Unknown time  . ferrous sulfate 325 (65 FE) MG tablet Take 325 mg by mouth daily with breakfast.   05/27/2015 at Unknown time  . furosemide (LASIX) 20 MG tablet Take 1 tablet (20 mg total) by mouth daily. 15 tablet 0 05/27/2015 at Unknown time  . HYDROcodone-acetaminophen (NORCO/VICODIN) 5-325 MG per tablet Take 1-2 tablets by mouth every 8 (eight) hours as needed. Severe pain.  0 05/27/2015 at Unknown time  . magnesium oxide (MAG-OX) 400 (241.3 MG) MG tablet Take 1 tablet (400 mg total) by mouth daily. 30 tablet 0 05/27/2015 at Unknown time  . metoprolol tartrate (LOPRESSOR) 25 MG tablet Take 1 tablet (25 mg total) by mouth 2 (two) times daily. 60 tablet 0 05/27/2015 at 1000  . mirtazapine (REMERON) 15 MG tablet Take 15 mg by mouth at bedtime.   05/26/2015 at Unknown time  . pantoprazole (PROTONIX) 40 MG tablet Take 40 mg by mouth daily.   05/27/2015 at Unknown time  . predniSONE (DELTASONE) 5 MG tablet Take 5 mg  by mouth every morning.    05/27/2015 at Unknown time  . ciprofloxacin (CIPRO) 500 MG tablet Take 1 tablet (500 mg total) by mouth 2 (two) times daily. (Patient not taking: Reported on 05/27/2015) 14 tablet 0 Completed Course at Unknown time  . imipenem-cilastatin 250 mg in sodium chloride 0.9 % 100 mL Inject 250 mg into the vein every 8 (eight) hours. (Patient not taking: Reported on 05/27/2015) 20 Dose 0 Completed Course at Unknown time  . metroNIDAZOLE (FLAGYL) 500 MG tablet Take 1 tablet (500 mg total) by mouth 2 (two) times daily. (Patient not taking: Reported on 05/27/2015) 14 tablet 0 Completed Course at Unknown time  .  saccharomyces boulardii (FLORASTOR) 250 MG capsule Take 1 capsule (250 mg total) by mouth 2 (two) times daily. (Patient not taking: Reported on 05/27/2015) 60 capsule 3 Completed Course at Unknown time  . senna-docusate (SENOKOT-S) 8.6-50 MG per tablet Take 1 tablet by mouth 2 (two) times daily. Available over the counter (Patient not taking: Reported on 03/25/2015)   Completed Course at Unknown time   Scheduled:  . aspirin EC  81 mg Oral Daily  . ciprofloxacin  400 mg Intravenous QHS  . docusate sodium  100 mg Oral Daily  . heparin  5,000 Units Subcutaneous 3 times per day  . metoprolol  2.5 mg Intravenous 4 times per day  . metronidazole  500 mg Intravenous Q8H  . mirtazapine  15 mg Oral QHS  . pantoprazole  40 mg Oral Daily  . predniSONE  5 mg Oral q morning - 10a  . sodium chloride  3 mL Intravenous Q12H   Infusions:  . sodium chloride     Assessment: 57 yoF presenting with altered mental status and weakness.  IV Cipro per Rx and Flagyl per MD for diverticulitis.   Goal of Therapy:  Treat diverticulitis  Plan:   Cipro 400mg  IV q24h  F/u Scr/cultures  R 05/28/2015,3:25 AM

## 2015-05-28 NOTE — ED Notes (Signed)
MD at bedside. 

## 2015-05-28 NOTE — Progress Notes (Signed)
Administered fleets enema. Patient had large formed bowel movement.

## 2015-05-28 NOTE — Progress Notes (Signed)
Patient had not urinated for at least 8 hours. Bladder scanned patient - >537ml shown.   Applied cold wash cloth to patient's perineal area for a couple of minutes. She urinated and therefore, did not have to page MD.

## 2015-05-28 NOTE — Consult Note (Signed)
Consultation Note Date: 05/28/2015   Patient Name: Shelly [name]  DOB: [birth year]  MRN: [id number]  Age / Sex: 81 y.o., female  PCP: Cain Saupe, MD Referring Physician: Alison Murray, MD  Reason for Consultation: Establishing goals of care    Clinical Assessment/Narrative: Patient is a 81 year old lady who lives at home with her daughter. Daughter is present at the bedside. Patient has a past medical history significant for dementia, hypertension, paroxysmal atrial fibrillation. Patient was hospitalized in October for fall, urinary tract infection. It was noted that at that time her CODE STATUS was DO NOT RESUSCITATE. It is also noted that at that time skilled nursing facility was advised, patient was taken home by her family members.  Patient has been admitted to the hospital because of complaints of not eating well and abdominal discomfort that was noted by family members, patient was clutching the right side of her abdomen. CT scan of the abdomen and pelvis obtained in the emergency department showed diverticulitis of the sigmoid colon, significant stool burden in the rectum area.  Patient is since admitted to the hospitalist service. She is on clear liquids, bowel regimen, antibiotics. Palliative consult is called for goals of care and CODE STATUS discussions  Patient's daughter states patient has had diverticulitis before, not severe enough to have hospitalizations. Patient does not have any history of colorectal malignancy. Daughter cannot recall if the patient has ever had a colonoscopy. Patient has not had any nausea or vomiting. Currently abdominal pain appears to be well controlled.  Palliative consultation continues  Contacts/Participants in Discussion: Patient's daughter Primary Decision Maker: [name] Relationship to Patient daughter HCPOA: yes  Yes according to daughter present at the bedside. Actual  document unknown  SUMMARY OF RECOMMENDATIONS: Continue to follow disease trajectory in this hospitalization Full code for now  Code Status/Advance Care Planning: Full code    Code Status Orders        Start     Ordered   05/28/15 0320  Full code   Continuous     05/28/15 0319    Advance Directive Documentation        Most Recent Value   Type of Advance Directive  Healthcare Power of Attorney   Pre-existing out of facility DNR order (yellow form or pink MOST form)     "MOST" Form in Place?        Other Directives:Other  Symptom Management:   As above  Palliative Prophylaxis:   Bowel Regimen  Additional Recommendations (Limitations, Scope, Preferences):  Full Scope Treatment    Psycho-social/Spiritual:  Support System: Strong Desire for further Chaplaincy support:no Additional Recommendations: Caregiving  Support/Resources  Prognosis: Unable to determine but prognosis certainly appears guarded given patient's advanced age, recurrent hospitalizations.  Discharge Planning: Home with Home Health or based on disease trajectory. Continue to follow.   Chief Complaint/ Primary Diagnoses: Present on Admission:  . Diverticulitis . Fecal impaction (HCC) . Dementia . Hypertension . Paroxysmal atrial fibrillation (HCC)  I have reviewed the medical record, interviewed the patient and family, and examined the patient. The following aspects are pertinent.  Past Medical History  Diagnosis Date  . Hypertension   . Bronchitis     "not chronic; not recurrent" (11/13/2013)  . Chronic anemia   . GERD (gastroesophageal reflux disease)   . Pulmonary embolism (HCC)   . DVT (deep venous thrombosis) (HCC)     "RLE?"  . Stroke (HCC)     11/13/2013 "she has a slow bleed"  .  Chronic kidney disease (CKD), stage II (mild)     Stage II, creatine clearance 40-50 cc /minute  . Dementia   . Rheumatoid arthritis (HCC)   . Atrial fibrillation (HCC)   . Recurrent falls   . ICH  (intracerebral hemorrhage) (HCC)     stroke with residual left sided weakness  . CKD (chronic kidney disease), stage IV (HCC)     baseline creatinine now 1.6  . SVT (supraventricular tachycardia) (HCC)   . DVT (deep venous thrombosis) (HCC)     RLE   Social History   Social History  . Marital Status: Single    Spouse Name: Tasia Catchings  . Number of Children: 4  . Years of Education: 12+   Occupational History  .     Social History Main Topics  . Smoking status: Never Smoker   . Smokeless tobacco: None  . Alcohol Use: No  . Drug Use: No  . Sexual Activity: No   Other Topics Concern  . None   Social History Narrative   ** Merged History Encounter **       Patient lives at home with her husband Tasia Catchings).   Patient has four children.   Patient has a high school and trade school education.   Patient is retired.   Patient does drink caffeine- one cup of coffee daily.   Patient is right-handed.   Family History  Problem Relation Age of Onset  . Hypertension Daughter   . High blood pressure    . Cancer Neg Hx   . Thyroid nodules     Scheduled Meds: . aspirin EC  81 mg Oral Daily  . Chlorhexidine Gluconate Cloth  6 each Topical Q0600  . ciprofloxacin  400 mg Intravenous QHS  . docusate sodium  100 mg Oral Daily  . heparin  5,000 Units Subcutaneous 3 times per day  . metoprolol  2.5 mg Intravenous 4 times per day  . metronidazole  500 mg Intravenous Q8H  . mirtazapine  15 mg Oral QHS  . mupirocin ointment   Nasal BID  . pantoprazole  40 mg Oral Daily  . predniSONE  5 mg Oral q morning - 10a  . sodium chloride  3 mL Intravenous Q12H   Continuous Infusions: . sodium chloride 75 mL/hr at 05/28/15 1008   PRN Meds:.ALPRAZolam, HYDROcodone-acetaminophen, HYDROmorphone (DILAUDID) injection, magnesium citrate, ondansetron **OR** ondansetron (ZOFRAN) IV Medications Prior to Admission:  Prior to Admission medications   Medication Sig Start Date End Date Taking? Authorizing  Provider  ALPRAZolam (XANAX) 0.25 MG tablet Take 0.125 mg by mouth 2 (two) times daily as needed for anxiety.   Yes Historical Provider, MD  aspirin EC 81 MG tablet Take 1 tablet (81 mg total) by mouth daily. 11/28/13  Yes Evlyn Kanner Love, PA-C  docusate sodium (COLACE) 100 MG capsule Take 100 mg by mouth daily as needed for mild constipation.   Yes Historical Provider, MD  feeding supplement, ENSURE ENLIVE, (ENSURE ENLIVE) LIQD Take 237 mLs by mouth 2 (two) times daily between meals. 04/15/15  Yes Renae Fickle, MD  ferrous sulfate 325 (65 FE) MG tablet Take 325 mg by mouth daily with breakfast.   Yes Historical Provider, MD  furosemide (LASIX) 20 MG tablet Take 1 tablet (20 mg total) by mouth daily. 02/08/15  Yes Dione Booze, MD  HYDROcodone-acetaminophen (NORCO/VICODIN) 5-325 MG per tablet Take 1-2 tablets by mouth every 8 (eight) hours as needed. Severe pain. 03/04/15  Yes Historical Provider, MD  magnesium oxide (  MAG-OX) 400 (241.3 MG) MG tablet Take 1 tablet (400 mg total) by mouth daily. 12/14/13  Yes Christiane Ha, MD  metoprolol tartrate (LOPRESSOR) 25 MG tablet Take 1 tablet (25 mg total) by mouth 2 (two) times daily. 12/14/13  Yes Christiane Ha, MD  mirtazapine (REMERON) 15 MG tablet Take 15 mg by mouth at bedtime.   Yes Historical Provider, MD  pantoprazole (PROTONIX) 40 MG tablet Take 40 mg by mouth daily.   Yes Historical Provider, MD  predniSONE (DELTASONE) 5 MG tablet Take 5 mg by mouth every morning.    Yes Historical Provider, MD  ciprofloxacin (CIPRO) 500 MG tablet Take 1 tablet (500 mg total) by mouth 2 (two) times daily. Patient not taking: Reported on 05/27/2015 03/25/15   Laurence Spates, MD  imipenem-cilastatin 250 mg in sodium chloride 0.9 % 100 mL Inject 250 mg into the vein every 8 (eight) hours. Patient not taking: Reported on 05/27/2015 04/15/15   Renae Fickle, MD  metroNIDAZOLE (FLAGYL) 500 MG tablet Take 1 tablet (500 mg total) by mouth 2 (two) times  daily. Patient not taking: Reported on 05/27/2015 03/25/15   Laurence Spates, MD  saccharomyces boulardii (FLORASTOR) 250 MG capsule Take 1 capsule (250 mg total) by mouth 2 (two) times daily. Patient not taking: Reported on 05/27/2015 04/15/15   Renae Fickle, MD  senna-docusate (SENOKOT-S) 8.6-50 MG per tablet Take 1 tablet by mouth 2 (two) times daily. Available over the counter Patient not taking: Reported on 03/25/2015 11/28/13   Jacquelynn Cree, PA-C   Allergies  Allergen Reactions  . Aricept [Donepezil Hcl] Nausea And Vomiting    She becomes violently ill per daughter.    Review of Systems Positive for gradual progressive decline since her hospitalization in October for fall and urine tract infection. Daughter states she has essentially become either bedbound or chair bound at home. Appetite is fair to good. Physical Exam Weak appearing elderly appearing lady resting in bed opens eyes to voice command otherwise is resting Regular work of breathing Extremities with no edema Currently does not appear to have abdominal discomfort. Detailed physical examination not performed: Daughter states that the patient had bowel movements and was up for most of the day and is taking a nap. Tolerating some sips of clear liquids. Vital Signs: BP 103/48 mmHg  Pulse 91  Temp(Src) 98.1 F (36.7 C) (Axillary)  Resp 20  Ht 5\' 1"  (1.549 m)  Wt 61.236 kg (135 lb)  BMI 25.52 kg/m2  SpO2 99%  SpO2: SpO2: 99 % O2 Device:SpO2: 99 % O2 Flow Rate: .   IO: Intake/output summary:  Intake/Output Summary (Last 24 hours) at 05/28/15 1717 Last data filed at 05/28/15 1300  Gross per 24 hour  Intake  307.5 ml  Output      0 ml  Net  307.5 ml    LBM:   11-2 2-16, patient had results after enema Baseline Weight: Weight: 61.236 kg (135 lb) Most recent weight: Weight: 61.236 kg (135 lb)      Palliative Assessment/Data:  Flowsheet Rows        Most Recent Value   Intake Tab    Referral Department   Hospitalist   Unit at Time of Referral  Med/Surg Unit   Palliative Care Primary Diagnosis  Other (Comment) [GI]   Date Notified  05/28/15   Palliative Care Type  New Palliative care   Reason for referral  Clarify Goals of Care   Date of Admission  05/28/15   #  of days IP prior to Palliative referral  1   Clinical Assessment    Palliative Performance Scale Score  30%   Psychosocial & Spiritual Assessment    Palliative Care Outcomes       Additional Data Reviewed:  CBC:    Component Value Date/Time   WBC 13.3* 05/28/2015 1150   HGB 10.0* 05/28/2015 1150   HCT 30.1* 05/28/2015 1150   PLT 321 05/28/2015 1150   MCV 72.0* 05/28/2015 1150   NEUTROABS 13.7* 05/27/2015 2226   LYMPHSABS 2.0 05/27/2015 2226   MONOABS 1.5* 05/27/2015 2226   EOSABS 0.1 05/27/2015 2226   BASOSABS 0.0 05/27/2015 2226   Comprehensive Metabolic Panel:    Component Value Date/Time   NA 147* 05/28/2015 1150   K 3.4* 05/28/2015 1150   CL 114* 05/28/2015 1150   CO2 23 05/28/2015 1150   BUN 42* 05/28/2015 1150   CREATININE 2.28* 05/28/2015 1150   GLUCOSE 97 05/28/2015 1150   CALCIUM 8.7* 05/28/2015 1150   AST 27 05/27/2015 2226   ALT 11* 05/27/2015 2226   ALKPHOS 87 05/27/2015 2226   BILITOT 0.9 05/27/2015 2226   PROT 9.2* 05/27/2015 2226   ALBUMIN 3.0* 05/27/2015 2226     Time In: 1600 Time Out: 1700 Time Total: 60 Greater than 50%  of this time was spent counseling and coordinating care related to the above assessment and plan.  Signed by: Rosalin Hawking, MD [phone] Rosalin Hawking, MD  05/28/2015, 5:17 PM  Please contact Palliative Medicine Team phone at [phone] for questions and concerns.

## 2015-05-29 ENCOUNTER — Encounter (HOSPITAL_COMMUNITY): Payer: Self-pay | Admitting: Internal Medicine

## 2015-05-29 DIAGNOSIS — R109 Unspecified abdominal pain: Secondary | ICD-10-CM | POA: Diagnosis present

## 2015-05-29 DIAGNOSIS — G9340 Encephalopathy, unspecified: Secondary | ICD-10-CM

## 2015-05-29 DIAGNOSIS — R532 Functional quadriplegia: Secondary | ICD-10-CM | POA: Diagnosis present

## 2015-05-29 DIAGNOSIS — N179 Acute kidney failure, unspecified: Secondary | ICD-10-CM | POA: Diagnosis present

## 2015-05-29 DIAGNOSIS — N189 Chronic kidney disease, unspecified: Secondary | ICD-10-CM

## 2015-05-29 DIAGNOSIS — R531 Weakness: Secondary | ICD-10-CM

## 2015-05-29 DIAGNOSIS — E876 Hypokalemia: Secondary | ICD-10-CM | POA: Diagnosis present

## 2015-05-29 DIAGNOSIS — N184 Chronic kidney disease, stage 4 (severe): Secondary | ICD-10-CM

## 2015-05-29 DIAGNOSIS — D72829 Elevated white blood cell count, unspecified: Secondary | ICD-10-CM | POA: Diagnosis present

## 2015-05-29 DIAGNOSIS — R296 Repeated falls: Secondary | ICD-10-CM

## 2015-05-29 DIAGNOSIS — D631 Anemia in chronic kidney disease: Secondary | ICD-10-CM

## 2015-05-29 LAB — CBC
HCT: 31.1 % — ABNORMAL LOW (ref 36.0–46.0)
Hemoglobin: 10.2 g/dL — ABNORMAL LOW (ref 12.0–15.0)
MCH: 23.8 pg — ABNORMAL LOW (ref 26.0–34.0)
MCHC: 32.8 g/dL (ref 30.0–36.0)
MCV: 72.5 fL — ABNORMAL LOW (ref 78.0–100.0)
Platelets: 315 10*3/uL (ref 150–400)
RBC: 4.29 MIL/uL (ref 3.87–5.11)
RDW: 16.3 % — ABNORMAL HIGH (ref 11.5–15.5)
WBC: 11.8 10*3/uL — ABNORMAL HIGH (ref 4.0–10.5)

## 2015-05-29 LAB — BASIC METABOLIC PANEL
Anion gap: 11 (ref 5–15)
BUN: 33 mg/dL — ABNORMAL HIGH (ref 6–20)
CO2: 21 mmol/L — ABNORMAL LOW (ref 22–32)
Calcium: 8.9 mg/dL (ref 8.9–10.3)
Chloride: 119 mmol/L — ABNORMAL HIGH (ref 101–111)
Creatinine, Ser: 1.83 mg/dL — ABNORMAL HIGH (ref 0.44–1.00)
GFR calc Af Amer: 26 mL/min — ABNORMAL LOW (ref >60)
GFR calc non Af Amer: 22 mL/min — ABNORMAL LOW (ref >60)
Glucose, Bld: 86 mg/dL (ref 65–99)
Potassium: 3.1 mmol/L — ABNORMAL LOW (ref 3.5–5.1)
Sodium: 151 mmol/L — ABNORMAL HIGH (ref 135–145)

## 2015-05-29 MED ORDER — POTASSIUM CHLORIDE CRYS ER 20 MEQ PO TBCR
40.0000 meq | EXTENDED_RELEASE_TABLET | Freq: Once | ORAL | Status: AC
  Administered 2015-05-29: 40 meq via ORAL
  Filled 2015-05-29: qty 2

## 2015-05-29 NOTE — Evaluation (Signed)
Physical Therapy Evaluation Patient Details Name: Shelly [name] MRN: [id number] DOB: 1918-10-16 Today's Date: 05/29/2015   History of Present Illness  81 year old female with a past medical history significant for dementia, history of stroke and intracranial bleed in past, atrial fibrillation (on aspirin only). Pt presented to [facility] ED with reports of right side abdominal pain associated with decreased bowel movement, decreased appetite and overall weakness. She is nonambulatory since recent fall in 04/2015.  Clinical Impression  Pt admitted with above diagnosis. Pt currently with functional limitations due to the deficits listed below (see PT Problem List). Pt will benefit from skilled PT to increase their independence and safety with mobility to allow discharge to the venue listed below. Pt's daughter reports she is able to provide necessary assistance for her mom at home, she has all necessary equipment and home care for additional assistance. Recommend to continue HHPT for continuous rehab.         Follow Up Recommendations Home health PT;Supervision/Assistance - 24 hour    Equipment Recommendations  None recommended by PT    Recommendations for Other Services       Precautions / Restrictions Precautions Precautions: Fall Precaution Comments: L2 compression fx, maintain back precautions Restrictions Weight Bearing Restrictions: No      Mobility  Bed Mobility Overal bed mobility: +2 for physical assistance;Needs Assistance Bed Mobility: Rolling;Sidelying to Sit;Sit to Sidelying Rolling: Max assist;+2 for physical assistance Sidelying to sit: +2 for physical assistance;Max assist     Sit to sidelying: Max assist;+2 for physical assistance General bed mobility comments: multimodal cues, increased time to respond, pt attempts bed mobility but unable to perform with self-assistance, required max assit +2 people for all bed mobility. Daughter states that pt is below baseline.     Transfers                 General transfer comment: Did not attempt transfers for pt safety. At home [facility] lift is used for pt transfer.  Ambulation/Gait                Stairs            Wheelchair Mobility    Modified Rankin (Stroke Patients Only)       Balance Overall balance assessment: Needs assistance Sitting-balance support: Bilateral upper extremity supported;Feet unsupported Sitting balance-Leahy Scale: Poor Sitting balance - Comments: BUE support sitting EOB for about 5 minutes, min guard level                                     Pertinent Vitals/Pain Pain Assessment: Faces Faces Pain Scale: Hurts even more Pain Location: pt didn't specify location, with movement Pain Descriptors / Indicators: Moaning;Grimacing;Guarding Pain Intervention(s): Limited activity within patient's tolerance;Monitored during session;Repositioned    Home Living Family/patient expects to be discharged to:: Private residence Living Arrangements: Children Available Help at Discharge: Family;Available 24 hours/day Type of Home: House       Home Layout: One level Home Equipment: Walker - 2 wheels;Bedside commode;Tub bench;Shower seat;Wheelchair - manual;Other (comment) [facility]iel Sites lift) Additional Comments: pt was a Producer, 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  Pt able to minimally assist with feeding and oral care.      Hand Dominance   Dominant Hand: Right    Extremity/Trunk Assessment   Upper Extremity Assessment: Generalized weakness (Observed strength ~ 2+/5)           Lower Extremity Assessment: Generalized weakness;RLE deficits/detail;LLE  deficits/detail RLE Deficits / Details: observed strength at best 2+/5, able to initiate, but unable to complete ROM LLE Deficits / Details: observed strength at best 2+/5, able to initiate, but unable to complete ROM  Cervical / Trunk Assessment: Normal  Communication   Communication: Expressive difficulties  Cognition Arousal/Alertness: Awake/alert Behavior During Therapy: Flat affect Overall Cognitive Status: History of cognitive impairments - at baseline       Memory: Decreased recall of precautions;Decreased short-term memory              General Comments General comments (skin integrity, edema, etc.): Pt's daughter present for session.     Exercises General Exercises - Lower Extremity Ankle Circles/Pumps: Both;5 reps;AAROM;Seated (mod assist to perform exercises ) Long Arc Quad: AAROM;Both;10 reps;Seated Hip Flexion/Marching: Both;AAROM;10 reps;Seated      Assessment/Plan    PT Assessment Patient needs continued PT services  PT Diagnosis Difficulty walking;Generalized weakness;Acute pain   PT Problem List Decreased strength;Decreased activity tolerance;Decreased balance;Decreased mobility;Decreased cognition;Decreased knowledge of use of DME;Decreased safety awareness;Pain  PT Treatment Interventions DME instruction;Functional mobility training;Therapeutic activities;Therapeutic exercise;Balance training;Patient/family education   PT Goals (Current goals can be found in the Care Plan section) Acute Rehab PT Goals Patient Stated Goal: To achieve baseline level and return home PT Goal Formulation: With family Time For Goal Achievement: 06/12/15 Potential to Achieve Goals: Good    Frequency Min 3X/week   Barriers to discharge        Co-evaluation PT/OT/SLP Co-Evaluation/Treatment: Yes Reason for Co-Treatment: For patient/therapist safety PT goals addressed during session: Mobility/safety with mobility OT goals addressed during session: ADL's and self-care        End of Session   Activity Tolerance: Other (comment);Patient limited by fatigue;Patient limited by pain (limited by cognition) Patient left: in bed;with call bell/phone within reach;with bed alarm set;with family/visitor present Nurse Communication: Mobility status         Time: 1287-8676 PT Time Calculation (min) (ACUTE ONLY): 24 min   Charges:   PT Evaluation $Initial PT Evaluation Tier I: 1 Procedure     PT G Codes:        Yuktha Kerchner, SPT 2015/05/30, 4:03 PM

## 2015-05-29 NOTE — Progress Notes (Signed)
Occupational Therapy Evaluation Patient Details Name: Shelly [name] MRN: [id number] DOB: May 21, 1919 Today's Date: 05/29/2015    History of Present Illness 81 year old female with a past medical history significant for dementia, history of stroke and intracranial bleed in past, atrial fibrillation (on aspirin only). Pt presented to [facility] ED with reports of right side abdominal pain associated with decreased bowel movement, decreased appetite and overall weakness. She is nonambulatory since recent fall in 04/2015.   Clinical Impression   Pt admitted with the above diagnoses and presents with below problem list. Pt will benefit from continued acute OT to address the below listed deficits and maximize independence with BADLs prior to d/c home with family assiting. Pt's daughter reports pt's activity tolerance is decreased from baseline. PTA pt was max to total A with ADLs; max A with grooming and eating; +2 for bed mobility, Hoyer lift for transfers. Pt is currently total A for ADLs, +2 max A for bed mobility. Daughter expressed desire to continue to care for pt at home. OT to continue to follow acutely and recommend HHOT at d/c.      Follow Up Recommendations  Supervision/Assistance - 24 hour;Home health OT    Equipment Recommendations  None recommended by OT    Recommendations for Other Services       Precautions / Restrictions Precautions Precautions: Fall Restrictions Weight Bearing Restrictions: No      Mobility Bed Mobility Overal bed mobility: +2 for physical assistance;Needs Assistance Bed Mobility: Rolling;Sidelying to Sit;Sit to Sidelying Rolling: Max assist;+2 for physical assistance Sidelying to sit: +2 for physical assistance;Max assist     Sit to sidelying: Max assist;+2 for physical assistance General bed mobility comments: Pt showing some initiation of movement but unable to complete with multimodal cues provided.   Transfers                       Balance Overall balance assessment: Needs assistance Sitting-balance support: Bilateral upper extremity supported;Feet supported Sitting balance-Leahy Scale: Poor Sitting balance - Comments: BUE support sitting EOB for about 5 minutes, min guard level                                    ADL Overall ADL's : Needs assistance/impaired Eating/Feeding: Total assistance;Bed level   Grooming: Total assistance;Sitting Grooming Details (indicate cue type and reason): Presented pt with washcloth sitting EOB. Pt able to breifly let go of matterss to hold washcloth with assist but ultimately did not bring to face.  Upper Body Bathing: Total assistance;Bed level   Lower Body Bathing: Total assistance;Bed level   Upper Body Dressing : Total assistance;Bed level   Lower Body Dressing: Total assistance;Bed level     Toilet Transfer Details (indicate cue type and reason): wears incontinence supplies   Toileting - Clothing Manipulation Details (indicate cue type and reason): wears incontinence supplies       General ADL Comments: Pt completed bed mobility and sat EOB with BUE and min guard assist for about 5 minutes.      Vision     Perception     Praxis      Pertinent Vitals/Pain Pain Assessment: Faces Faces Pain Scale: Hurts even more Pain Location: with bed mobility Pain Descriptors / Indicators: Grimacing;Moaning Pain Intervention(s): Monitored during session;Repositioned     Hand Dominance Right   Extremity/Trunk Assessment Upper Extremity Assessment Upper Extremity Assessment: Generalized weakness   Lower  Extremity Assessment Lower Extremity Assessment: Defer to PT evaluation       Communication Communication Communication: Expressive difficulties   Cognition Arousal/Alertness: Awake/alert Behavior During Therapy: Flat affect Overall Cognitive Status: History of cognitive impairments - at baseline       Memory: Decreased recall of  precautions;Decreased short-term memory             General Comments       Exercises       Shoulder Instructions      Home Living Family/patient expects to be discharged to:: Private residence Living Arrangements: Children Available Help at Discharge: Family;Available 24 hours/day Type of Home: House                       Home Equipment: Walker - 2 wheels;Bedside commode;Tub bench;Shower seat;Wheelchair - Careers adviser (comment) Secondary school teacher)   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 Pt able to minimally assist with feeding and oral care.     OT Diagnosis: Generalized weakness;Cognitive deficits;Acute pain   OT Problem List: Decreased strength;Decreased activity tolerance;Impaired balance (sitting and/or standing);Decreased knowledge of use of DME or AE;Decreased knowledge of precautions;Pain   OT Treatment/Interventions: Self-care/ADL training;DME and/or AE instruction;Therapeutic activities;Patient/family education;Balance training;Therapeutic exercise;Energy conservation    OT Goals(Current goals can be found in the care plan section) Acute Rehab OT Goals Patient Stated Goal: unable to state OT Goal Formulation: With family Time For Goal Achievement: 06/12/15 Potential to Achieve Goals: Good ADL Goals Pt Will Perform Eating: with max assist;bed level Pt Will Perform Grooming: with max assist;bed level;sitting Additional ADL Goal #1: Pt will complete simple ADL task sitting EOB with min guard assist.  Additional ADL Goal #2: Pt will complete bed mobility at mod +2 physical assist level utilizing log roll technique.   OT Frequency: Min 2X/week   Barriers to D/C:            Co-evaluation PT/OT/SLP  Co-Evaluation/Treatment: Yes Reason for Co-Treatment: For patient/therapist safety   OT goals addressed during session: ADL's and self-care      End of Session Nurse Communication: Need for lift equipment  Activity Tolerance: Patient tolerated treatment well Patient left: in bed;with call bell/phone within reach;with bed alarm set;with family/visitor present   Time: 8295-6213 OT Time Calculation (min): 24 min Charges:  OT General Charges $OT Visit: 1 Procedure OT Evaluation $Initial OT Evaluation Tier I: 1 Procedure G-Codes:    Pilar Grammes 06/01/2015, 2:54 PM

## 2015-05-29 NOTE — Progress Notes (Addendum)
Patient ID: Shelly [name], female   DOB: [birth year], 81 y.o.   MRN: [id number] [facility] PROGRESS NOTE  Shelly [name]:[id number] DOB: 10-21-1918 DOA: 05/27/2015 PCP: Cain Saupe, MD  Brief narrative:    81 year old female with a past medical history significant for dementia, history of stroke and intracranial bleed in past, atrial fibrillation (on aspirin only). Pt presented to [facility] ED with reports of right side abdominal pain associated with decreased bowel movement, decreased appetite and overall weakness. She is nonambulatory since recent fall in 04/2015. She was found to have acute diverticulitis and was started on cipro and flagyl.   Anticipated discharge: 11/25, likely home as daughter prefers she goes home.  Assessment/Plan:    Principal Problem:   Acute diverticulitis in proximal sigmoid colon / Abdominal pain, right side - CT scan demonstrated diverticulitis in proximal sigmoid colon with focal wall thickening without perforation or abscess. Also seen was fecal impaction. - Started on cipro and flagyl - Diet advanced to as tolerated today - Supportive care with IV fluids until PO intake better - Analgesia as needed for pain control   Active Problems:   Rheumatoid arthritis (HCC) / Leukocytosis - On low dose prednisone     Hypertension, essential - Continue metoprolol     Acute encephalopathy / Dementia / History of hemorrhagic stroke with residual hemiparesis (HCC) - Likely related to history of stroke - On daily aspirin - PT eval pending     Paroxysmal atrial fibrillation (HCC) - CHADS vasc score at least 5-6 (age, htn, stroke, vascular) - On aspirin for anticoagulation - Rate controlled with metoprolol     Hypokalemia - Due to lasix - Supplemented     Hypernatremia - Likely dehydration - Sodium 147 --> 151 - Continue IV fluids and check BMP tomorrow     Anemia of chronic kidney failrue  - Hemoglobin stable - No reports of bleeding     Recurrent  falls / Generalized weakness / Functional quadriplegia  - PT eval if patient able to participate     Fecal impaction (HCC) - Had 1 BM in past 24 hours     Encounter for palliative care / Goals of care, counseling/discussion - PCT on board, appreciate their help    Acute renal failure superimposed on stage 4 chronic kidney disease (HCC) - Baseline Cr 1.78 in 03/2015 - Cr on this admission 2.63 - Likely due to Lasix which was placed on hold - Cr already improving to baseline values    DVT Prophylaxis  - SCD's bilaterally   Code Status: Full.  Family Communication:  plan of care discussed with the patient's daughter at the bedside Disposition Plan: likely by 05/31/2015.  IV access:  Peripheral IV  Procedures and diagnostic studies:    Ct Abdomen Pelvis Wo Contrast 05/27/2015  1. Acute diverticulitis at the proximal sigmoid colon, with focal wall thickening, surrounding soft tissue inflammation and trace free fluid. No evidence of perforation or abscess formation. Follow-up colonoscopy could be considered after completion of treatment, to exclude an underlying mass, as deemed clinically appropriate. 2. Dense stool noted filling the rectum, measuring up to 8.1 cm in transverse dimension, concerning for fecal impaction. 3. Scattered diverticulosis along the sigmoid colon. 4. Scattered calcification along the abdominal aorta and its branches. 5. Chronic scarring again noted at the lung bases. 6. Calcified uterine fibroid seen. 7. Tiny hiatal hernia seen. 8. New compression deformity at L2, and chronic compression deformity at L1.   Dg Chest Port 1  View 05/27/2015   Chronic fibrosis, bronchiectasis, and chronic bronchitic changes in the lungs. Superimposed opacity in the left lung base may represent pneumonia or atelectasis.   Medical Consultants:  None   Other Consultants:  PT Nutrition  IAnti-Infectives:   Cipro and flagyl 05/28/2015 -->   Manson Passey, MD  [facility] Pager [phone]  Time spent in minutes: 25 minutes  If 7PM-7AM, please contact night-coverage [email or web address] Password [email or web address] 05/29/2015, 11:48 AM   LOS: 1 day    HPI/Subjective: No acute overnight events. Had BM in past 24 hours.  Objective: Filed Vitals:   05/28/15 1200 05/28/15 1439 05/28/15 2206 05/29/15 0536  BP:  103/48 122/62 106/54  Pulse:  91 85 87  Temp:  98.1 F (36.7 C) 98.1 F (36.7 C) 99.4 F (37.4 C)  TempSrc:  Axillary Axillary Oral  Resp:  20 20 20   Height: 5\' 1"  (1.549 m)     Weight: 61.236 kg (135 lb)     SpO2:  99% 97% 97%    Intake/Output Summary (Last 24 hours) at 05/29/15 1148 Last data filed at 05/29/15 1013  Gross per 24 hour  Intake    220 ml  Output      0 ml  Net    220 ml    Exam:   General:  Pt is alert, not in acute distress  Cardiovascular: Rate controlled, S1/S2 (+)  Respiratory: No wheezing, no crackles, no rhonchi  Abdomen: (+) BS, non tender   Extremities: No leg swelling, palpable pulses   Neuro: Nonfocal  Data Reviewed: Basic Metabolic Panel:  Recent Labs Lab 05/27/15 2226 05/28/15 1150 05/29/15 0810  NA 147* 147* 151*  K 4.0 3.4* 3.1*  CL 109 114* 119*  CO2 27 23 21*  GLUCOSE 111* 97 86  BUN 50* 42* 33*  CREATININE 2.63* 2.28* 1.83*  CALCIUM 9.6 8.7* 8.9  MG  --  2.1  --   PHOS  --  3.1  --    Liver Function Tests:  Recent Labs Lab 05/27/15 2226  AST 27  ALT 11*  ALKPHOS 87  BILITOT 0.9  PROT 9.2*  ALBUMIN 3.0*    Recent Labs Lab 05/27/15 2226  LIPASE 31   No results for input(s): AMMONIA in the last 168 hours. CBC:  Recent Labs Lab 05/27/15 2226 05/28/15 1150 05/29/15 0810  WBC 17.4* 13.3* 11.8*  NEUTROABS 13.7*  --   --   HGB 12.1 10.0* 10.2*  HCT 35.8* 30.1* 31.1*  MCV 71.7* 72.0* 72.5*  PLT 373 321 315   Cardiac Enzymes: No results for input(s): CKTOTAL, CKMB, CKMBINDEX, TROPONINI in the last 168 hours. BNP: Invalid input(s): POCBNP CBG: No results for  input(s): GLUCAP in the last 168 hours.  Urine culture     Status: None (Preliminary result)   Collection Time: 05/27/15 11:58 PM  Result Value Ref Range Status   Specimen Description URINE, CLEAN CATCH  Final   Special Requests NONE  Final   Culture   Final    CULTURE REINCUBATED FOR BETTER GROWTH    Report Status PENDING  Incomplete  MRSA PCR Screening     Status: Abnormal   Collection Time: 05/28/15  2:26 PM  Result Value Ref Range Status   MRSA by PCR POSITIVE (A) NEGATIVE Final     Scheduled Meds: . aspirin EC  81 mg Oral Daily  . ciprofloxacin  400 mg Intravenous QHS  . docusate sodium  100 mg Oral Daily  . metoprolol  2.5 mg Intravenous 4 times per day  . metronidazole  500 mg Intravenous Q8H  . mirtazapine  15 mg Oral QHS  . mupirocin ointment   Nasal BID  . pantoprazole  40 mg Oral Daily  . potassium chloride  40 mEq Oral Once  . predniSONE  5 mg Oral q morning - 10a   Continuous Infusions: . sodium chloride 75 mL/hr (05/28/15 2249)

## 2015-05-30 DIAGNOSIS — K5792 Diverticulitis of intestine, part unspecified, without perforation or abscess without bleeding: Secondary | ICD-10-CM

## 2015-05-30 DIAGNOSIS — E870 Hyperosmolality and hypernatremia: Secondary | ICD-10-CM | POA: Diagnosis not present

## 2015-05-30 DIAGNOSIS — R1031 Right lower quadrant pain: Secondary | ICD-10-CM

## 2015-05-30 DIAGNOSIS — R532 Functional quadriplegia: Secondary | ICD-10-CM

## 2015-05-30 DIAGNOSIS — Z515 Encounter for palliative care: Secondary | ICD-10-CM

## 2015-05-30 DIAGNOSIS — Z7189 Other specified counseling: Secondary | ICD-10-CM

## 2015-05-30 LAB — BASIC METABOLIC PANEL
Anion gap: 8 (ref 5–15)
BUN: 21 mg/dL — ABNORMAL HIGH (ref 6–20)
CO2: 21 mmol/L — ABNORMAL LOW (ref 22–32)
Calcium: 8.4 mg/dL — ABNORMAL LOW (ref 8.9–10.3)
Chloride: 123 mmol/L — ABNORMAL HIGH (ref 101–111)
Creatinine, Ser: 1.42 mg/dL — ABNORMAL HIGH (ref 0.44–1.00)
GFR calc Af Amer: 35 mL/min — ABNORMAL LOW (ref >60)
GFR calc non Af Amer: 31 mL/min — ABNORMAL LOW (ref >60)
Glucose, Bld: 89 mg/dL (ref 65–99)
Potassium: 3.4 mmol/L — ABNORMAL LOW (ref 3.5–5.1)
Sodium: 152 mmol/L — ABNORMAL HIGH (ref 135–145)

## 2015-05-30 LAB — URINE CULTURE

## 2015-05-30 MED ORDER — KCL IN DEXTROSE-NACL 20-5-0.9 MEQ/L-%-% IV SOLN
INTRAVENOUS | Status: DC
  Administered 2015-05-30 – 2015-06-02 (×6): via INTRAVENOUS
  Filled 2015-05-30 (×7): qty 1000

## 2015-05-30 MED ORDER — POTASSIUM CHLORIDE CRYS ER 20 MEQ PO TBCR: 40.0000 meq | EXTENDED_RELEASE_TABLET | Freq: Once | ORAL | Status: DC

## 2015-05-30 MED ORDER — POTASSIUM CHLORIDE 2 MEQ/ML IV SOLN: INTRAVENOUS | Status: DC

## 2015-05-30 NOTE — Progress Notes (Addendum)
Patient ID: Shelly [name], female   DOB: 11/24/1918, 81 y.o.   MRN: [id number] [facility] PROGRESS NOTE  Shelly [name]:[id number] DOB: 08-09-1918 DOA: 05/27/2015 PCP: Cain Saupe, MD  Brief narrative:    81 year old female with a past medical history significant for dementia, history of stroke and intracranial bleed in past, atrial fibrillation (on aspirin only). Pt presented to [facility] ED with reports of right side abdominal pain associated with decreased bowel movement, decreased appetite and overall weakness. She is nonambulatory since recent fall in 04/2015.  She was found to have acute diverticulitis and was started on cipro and flagyl.   Anticipated discharge: 11/25, home health orders placed.  Assessment/Plan:    Principal Problem:   Acute diverticulitis in proximal sigmoid colon / Abdominal pain, right side - CT scan on admission demonstrated diverticulitis in proximal sigmoid colon with focal wall thickening without perforation or abscess.  - Pt started on cipro and flagyl - She tolerates regular diet but does not eat a lot, her daughter at the bedside says she does not have difficulty swallowing - Pain controlled so far   Active Problems:   Rheumatoid arthritis (HCC) / Leukocytosis - Continue low dose prednisone     Hypertension, essential - Continue metoprolol  - BP 126/56    Acute encephalopathy / Dementia / History of hemorrhagic stroke with residual hemiparesis (HCC) - Likely secondary to stroke, mostly non verbal - Continue daily aspirin - PT eval - HH PT, orders placed     Paroxysmal atrial fibrillation (HCC) - CHADS vasc score at least 5-6 (age, htn, stroke, vascular) - Continue daily aspirin  - Rate is controlled with metoprolol     Hypokalemia - Secondary to lasix - Supplemented     Hypernatremia - Likely prerenal, dehydration, poor po intake - Continue IV fluids, may change to d5 water to see if any improvement     Anemia of chronic kidney  failrue  - Hemoglobin stable at 10.2     Recurrent falls / Generalized weakness / Functional quadriplegia  - PT eval if patient able to participate     Fecal impaction (HCC) - Had few BM in past 24 hours, no diarrhea     Encounter for palliative care / Goals of care, counseling/discussion - PCT consulted for goals of care     Acute renal failure superimposed on stage 4 chronic kidney disease (HCC) - Baseline Cr 1.78 in 03/2015 - Cr on this admission 2.63 and further has improved with fluids. Lasix placed on hold  - Cr this am, 1.42   DVT Prophylaxis  - SCD's bilaterally in hospital   Code Status: Full.  Family Communication:  plan of care discussed with the patient's daughter at the bedside Disposition Plan: likely by 05/31/2015.  IV access:  Peripheral IV  Procedures and diagnostic studies:    Ct Abdomen Pelvis Wo Contrast 05/27/2015  1. Acute diverticulitis at the proximal sigmoid colon, with focal wall thickening, surrounding soft tissue inflammation and trace free fluid. No evidence of perforation or abscess formation. Follow-up colonoscopy could be considered after completion of treatment, to exclude an underlying mass, as deemed clinically appropriate. 2. Dense stool noted filling the rectum, measuring up to 8.1 cm in transverse dimension, concerning for fecal impaction. 3. Scattered diverticulosis along the sigmoid colon. 4. Scattered calcification along the abdominal aorta and its branches. 5. Chronic scarring again noted at the lung bases. 6. Calcified uterine fibroid seen. 7. Tiny hiatal hernia seen. 8. New compression deformity  at L2, and chronic compression deformity at L1.   Dg Chest Port 1 View 05/27/2015   Chronic fibrosis, bronchiectasis, and chronic bronchitic changes in the lungs. Superimposed opacity in the left lung base may represent pneumonia or atelectasis.   Medical Consultants:  None   Other Consultants:  PT Nutrition  IAnti-Infectives:   Cipro and  flagyl 05/28/2015 -->   Manson Passey, MD  [facility] Pager [phone]  Time spent in minutes: 15 minutes  If 7PM-7AM, please contact night-coverage [email or web address] Password [email or web address] 05/30/2015, 10:10 AM   LOS: 2 days    HPI/Subjective: No acute overnight events. No nausea or vomiting.   Objective: Filed Vitals:   05/29/15 1437 05/29/15 1748 05/29/15 2050 05/30/15 0622  BP: 137/67 124/78 135/67 126/56  Pulse: 90  76 84  Temp: 98 F (36.7 C)  98 F (36.7 C) 97.9 F (36.6 C)  TempSrc: Oral  Axillary Oral  Resp: 18  18 18   Height:      Weight:      SpO2: 98%  96% 98%    Intake/Output Summary (Last 24 hours) at 05/30/15 1010 Last data filed at 05/29/15 1815  Gross per 24 hour  Intake 1101.25 ml  Output      0 ml  Net 1101.25 ml    Exam:   General:  Pt is sleeping, no acute distress  Cardiovascular: RRR, appreciate S1, S2   Respiratory: Bilateral air entry, no wheezing   Abdomen: non tender abd, (+) BS, no rebound or guarding    Extremities: No edema, palpable pulses   Neuro: No focal deficits   Data Reviewed: Basic Metabolic Panel:  Recent Labs Lab 05/27/15 2226 05/28/15 1150 05/29/15 0810  NA 147* 147* 151*  K 4.0 3.4* 3.1*  CL 109 114* 119*  CO2 27 23 21*  GLUCOSE 111* 97 86  BUN 50* 42* 33*  CREATININE 2.63* 2.28* 1.83*  CALCIUM 9.6 8.7* 8.9  MG  --  2.1  --   PHOS  --  3.1  --    Liver Function Tests:  Recent Labs Lab 05/27/15 2226  AST 27  ALT 11*  ALKPHOS 87  BILITOT 0.9  PROT 9.2*  ALBUMIN 3.0*    Recent Labs Lab 05/27/15 2226  LIPASE 31   No results for input(s): AMMONIA in the last 168 hours. CBC:  Recent Labs Lab 05/27/15 2226 05/28/15 1150 05/29/15 0810  WBC 17.4* 13.3* 11.8*  NEUTROABS 13.7*  --   --   HGB 12.1 10.0* 10.2*  HCT 35.8* 30.1* 31.1*  MCV 71.7* 72.0* 72.5*  PLT 373 321 315   Cardiac Enzymes: No results for input(s): CKTOTAL, CKMB, CKMBINDEX, TROPONINI in the last 168  hours. BNP: Invalid input(s): POCBNP CBG: No results for input(s): GLUCAP in the last 168 hours.  Urine culture     Status: None (Preliminary result)   Collection Time: 05/27/15 11:58 PM  Result Value Ref Range Status   Specimen Description URINE, CLEAN CATCH  Final   Special Requests NONE  Final   Culture   Final    CULTURE REINCUBATED FOR BETTER GROWTH    Report Status PENDING  Incomplete  MRSA PCR Screening     Status: Abnormal   Collection Time: 05/28/15  2:26 PM  Result Value Ref Range Status   MRSA by PCR POSITIVE (A) NEGATIVE Final     Scheduled Meds: . aspirin EC  81 mg Oral Daily  . ciprofloxacin  400 mg Intravenous QHS  . docusate sodium  100 mg Oral Daily  . metoprolol  2.5 mg Intravenous 4 times per day  . metronidazole  500 mg Intravenous Q8H  . mirtazapine  15 mg Oral QHS  . mupirocin ointment   Nasal BID  . pantoprazole  40 mg Oral Daily  . potassium chloride  40 mEq Oral Once  . predniSONE  5 mg Oral q morning - 10a   Continuous Infusions: . sodium chloride 75 mL/hr (05/29/15 2202)

## 2015-05-30 NOTE — Progress Notes (Signed)
Daily Progress Note   Patient Name: Shelly [name]       Date: 05/30/2015 DOB: 12/24/1918  Age: 81 y.o. MRN#: [id number] Attending Physician: Robbie Lis, MD Primary Care Physician: Antony Blackbird, MD Admit Date: 05/27/2015  Reason for Consultation/Follow-up: Establishing goals of care  Subjective: I met with Shelly [name] and Shelly daughter today.  Shelly [name] slept throughout encounter and did not arouse.  I spoke with Shelly daughter about this current hospitalization as well as Shelly [name]'s overall disease trajectory.  See [place] below.  No change to care plan at this time.  Length of Stay: 2 days  Current Medications: Scheduled Meds:  . aspirin EC  81 mg Oral Daily  . Chlorhexidine Gluconate Cloth  6 each Topical Q0600  . ciprofloxacin  400 mg Intravenous QHS  . docusate sodium  100 mg Oral Daily  . metoprolol  2.5 mg Intravenous 4 times per day  . metronidazole  500 mg Intravenous Q8H  . mirtazapine  15 mg Oral QHS  . mupirocin ointment   Nasal BID  . pantoprazole  40 mg Oral Daily  . predniSONE  5 mg Oral q morning - 10a  . sodium chloride  3 mL Intravenous Q12H    Continuous Infusions: . dextrose 5 % and 0.9 % NaCl with KCl 20 mEq/L 75 mL/hr at 05/30/15 1209    PRN Meds: ALPRAZolam, HYDROcodone-acetaminophen, HYDROmorphone (DILAUDID) injection, ondansetron **OR** ondansetron (ZOFRAN) IV  Palliative Performance Scale: 30%     Vital Signs: BP 126/56 mmHg  Pulse 84  Temp(Src) 97.9 F (36.6 C) (Oral)  Resp 18  Ht '5\' 1"'  (1.549 m)  Wt 61.236 kg (135 lb)  BMI 25.52 kg/m2  SpO2 98% SpO2: SpO2: 98 % O2 Device: O2 Device: Not Delivered O2 Flow Rate:    Intake/output summary:  Intake/Output Summary (Last 24 hours) at 05/30/15 1421 Last data filed at 05/29/15 1815  Gross per 24 hour  Intake 401.25 ml  Output      0 ml  Net 401.25 ml   LBM:   Baseline Weight: Weight: 61.236 kg (135 lb) Most recent weight: Weight: 61.236 kg (135 lb)  Physical Exam: General: Sleeping  throughout encounter. in no acute distress.  HEENT: No bruits, no goiter, no JVD Heart: Regular rate. No murmur appreciated. Lungs: Fair air movement, no audible wheezing Abdomen: nondistended  Skin: Warm and dry              Additional Data Reviewed: Recent Labs     05/28/15  1150  05/29/15  0810  05/30/15  0943  WBC  13.3*  11.8*   --   HGB  10.0*  10.2*   --   PLT  321  315   --   NA  147*  151*  152*  BUN  42*  33*  21*  CREATININE  2.28*  1.83*  1.42*     Problem List:  Patient Active Problem List   Diagnosis Date Noted  . Hypernatremia 05/30/2015  . Abdominal pain 05/29/2015  . Acute renal failure superimposed on stage 4 chronic kidney disease ([place]) 05/29/2015  . Hypokalemia 05/29/2015  . Anemia of chronic kidney failure 05/29/2015  . Leukocytosis 05/29/2015  . Functional quadriplegia ([place]) 05/29/2015  . Fecal impaction ([place]) 05/28/2015  . Acute diverticulitis   . Encounter for palliative care   . Goals of care, counseling/discussion   . Paroxysmal atrial fibrillation ([place]) 04/12/2015  . Recurrent falls 04/12/2015  . Generalized weakness 04/12/2015  .  Dementia 12/11/2013  . Acute encephalopathy 12/10/2013  . History of hemorrhagic stroke with residual hemiparesis ([place]) 12/10/2013  . Rheumatoid arthritis ([place]) 10/21/2012  . Hypertension 10/21/2012     Palliative Care Assessment & Plan    Code Status:  Full code  Goals of Care: I met with the patient's daughter today.  She reports that Shelly [name] enjoys a good quality of life at home and that the most important things to Shelly are Shelly family and being at home. We discussed that the hospital can be useful as long as Shelly [name] is getting well enough from care she receives at the hospital to enjoy Shelly time at home, but there is going to come a time in the near future where, if Shelly goal is to be at home, she may be better served to plan on being at home and bringing care to Shelly at home rather repeated trips to  the hospital. I did not specifically mention hospice this visit as she was becoming visably upset with the conversation. We reviewed a MOST form and discussed how it can be used to develop plan of care to focus on continuing therapies that would maximize chance of being well enough to return home and limiting therapies not in line with this goal. We discussed heroic interventions at the end-of-life. I expressed concern that aggressive measures such as CPR and mechanical ventilation would not be likely to lead to Shelly [name] ever getting well enough to go back home.  I gave Shelly a copy of Hard Choices for Loving People to review.   She reports that she will need to continue this conversation with Shelly family as well as Shelly [name] as an outpatient.  She is not interested in completing MOST form or further discussion about code status or goals of care until she speaks with Shelly [name].  I asked Shelly to review the book I gave Shelly prior to Shelly [name]'s f/u appointment and review any questions she had after reading it with Shelly [name].   Symptom Management:  No uncontrolled symptoms reported  Palliative Prophylaxis:  Delirium, bowel regimen  Psycho-social/Spiritual: Desire for further Chaplaincy support: no  Prognosis: Unable to determine due to acute illness. Discharge Planning: Home with [place] was discussed with patient's daughter and bedside RN  Thank you for allowing the Palliative Medicine Team to assist in the care of this patient.   Time In: 0910 Time Out: 0950 Total Time 40 Prolonged Time Billed no    Greater than 50%  of this time was spent counseling and coordinating care related to the above assessment and plan.   Micheline Rough, MD  05/30/2015, 2:21 PM  Please contact Palliative Medicine Team phone at [phone] for questions and concerns.

## 2015-05-31 DIAGNOSIS — R1084 Generalized abdominal pain: Secondary | ICD-10-CM

## 2015-05-31 DIAGNOSIS — E870 Hyperosmolality and hypernatremia: Secondary | ICD-10-CM

## 2015-05-31 LAB — BASIC METABOLIC PANEL
Anion gap: 5 (ref 5–15)
BUN: 12 mg/dL (ref 6–20)
CO2: 21 mmol/L — ABNORMAL LOW (ref 22–32)
Calcium: 8.7 mg/dL — ABNORMAL LOW (ref 8.9–10.3)
Chloride: 127 mmol/L — ABNORMAL HIGH (ref 101–111)
Creatinine, Ser: 1.21 mg/dL — ABNORMAL HIGH (ref 0.44–1.00)
GFR calc Af Amer: 43 mL/min — ABNORMAL LOW (ref >60)
GFR calc non Af Amer: 37 mL/min — ABNORMAL LOW (ref >60)
Glucose, Bld: 127 mg/dL — ABNORMAL HIGH (ref 65–99)
Potassium: 3.6 mmol/L (ref 3.5–5.1)
Sodium: 153 mmol/L — ABNORMAL HIGH (ref 135–145)

## 2015-05-31 LAB — CBC
HCT: 31.3 % — ABNORMAL LOW (ref 36.0–46.0)
Hemoglobin: 10.1 g/dL — ABNORMAL LOW (ref 12.0–15.0)
MCH: 23.3 pg — ABNORMAL LOW (ref 26.0–34.0)
MCHC: 32.3 g/dL (ref 30.0–36.0)
MCV: 72.3 fL — ABNORMAL LOW (ref 78.0–100.0)
Platelets: 259 10*3/uL (ref 150–400)
RBC: 4.33 MIL/uL (ref 3.87–5.11)
RDW: 16.5 % — ABNORMAL HIGH (ref 11.5–15.5)
WBC: 10.9 10*3/uL — ABNORMAL HIGH (ref 4.0–10.5)

## 2015-05-31 MED ORDER — ENSURE ENLIVE PO LIQD
237.0000 mL | Freq: Two times a day (BID) | ORAL | Status: DC
  Administered 2015-05-31 – 2015-06-02 (×3): 237 mL via ORAL

## 2015-05-31 NOTE — Care Management Note (Signed)
Case Management Note  Patient Details  Name: Shelly [name] MRN: [id number] Date of Birth: 26-Aug-1918  Subjective/Objective:        Admitted with diverticulitis            Action/Plan: Discharge planning, spoke with family at bedside. Anticipating d/c in am, want to resume [facility] services with [facility]. Contacted [facility] for referral. No new DME needs.   Expected Discharge Date:                  Expected Discharge Plan:  Home w Home Health Services  In-House Referral:  Clinical Social Work  Discharge planning Services  CM Consult  Post Acute Care Choice:  Home Health Choice offered to:  Adult Children  DME Arranged:  N/A DME Agency:  NA  HH Arranged:  RN, PT, OT, Nurse's Aide HH Agency:  [facility]  Status of Service:  Completed, signed off  Medicare Important Message Given:    Date Medicare IM Given:    Medicare IM give by:    Date Additional Medicare IM Given:    Additional Medicare Important Message give by:     If discussed at Long Length of Stay Meetings, dates discussed:    Additional Comments:  Alexis Goodell, RN 05/31/2015, 11:23 AM

## 2015-05-31 NOTE — Progress Notes (Signed)
Pharmacy Antibiotic Time-Out Note  Shelly [name] is a 81 y.o. year-old female admitted on 05/27/2015.  The patient is currently on ciprofloxacin and metronidazole day #4 for acute diverticulitis.  Assessment/Plan: This patient's current antibiotics will be continued without adjustments.  - ciprofloxacin 400 mg IV q24h - flagyl 500 mg IV q8h (per MD) - Typical recommended LOT for diverticulitis is 7-10 days.  Consider entering stop date for abx in [facility].  Temp (24hrs), Avg:98.1 F (36.7 C), Min:97.8 F (36.6 C), Max:98.3 F (36.8 C)   Recent Labs Lab 05/27/15 2226 05/28/15 1150 05/29/15 0810  WBC 17.4* 13.3* 11.8*    Recent Labs Lab 05/27/15 2226 05/28/15 1150 05/29/15 0810 05/30/15 0943  CREATININE 2.63* 2.28* 1.83* 1.42*   Estimated Creatinine Clearance: 20.3 mL/min (by C-G formula based on Cr of 1.42).    Antimicrobial allergies: none  Antimicrobials this admission: 11/21 >>cipro Rx >> 11/22 >>flagyl  >>    Levels/dose changes this admission:  Microbiology Results: 11/21 urine: multiple spp.FINAL 11/22 MRSA PCR: positive (on bactroban and chlx)   Thank you for allowing pharmacy to be a part of this patient's care.  Lucia Gaskins PharmD 05/31/2015 8:26 AM

## 2015-05-31 NOTE — Progress Notes (Signed)
Patient ID: Shelly [name], female   DOB: 10-26-1918, 81 y.o.   MRN: [id number] [facility] PROGRESS NOTE  LEVETTE PAULICK OEV:[id number] DOB: February 01, 1919 DOA: 05/27/2015 PCP: Cain Saupe, MD  Brief narrative:    81 year old female with a past medical history significant for dementia, history of stroke and intracranial bleed in past, atrial fibrillation (on aspirin only). Pt presented to [facility] ED with reports of right side abdominal pain associated with decreased bowel movement, decreased appetite and overall weakness. She is nonambulatory since recent fall in 04/2015.  She was found to have acute diverticulitis and was started on cipro and flagyl.   Anticipated discharge: if sodium improving, goal less than 150, home 11/26.  Assessment/Plan:    Principal Problem:   Acute diverticulitis in proximal sigmoid colon / Abdominal pain, right side - CT scan on admission showed diverticulitis in proximal sigmoid colon with focal wall thickening without perforation or abscess.  - Continue cipro and flagyl - Poor po intake but no difficulty swallowing.  Active Problems:   Rheumatoid arthritis (HCC) / Leukocytosis - Continue low dose prednisone     Hypertension, essential - Continue metoprolol     Acute encephalopathy / Dementia / History of hemorrhagic stroke with residual hemiparesis (HCC) - Likely secondary to stroke, mostly non verbal - Continue daily aspirin - HH orders placed once she is stable for discharge     Paroxysmal atrial fibrillation (HCC) - CHADS vasc score at least 5-6 (age, htn, stroke, vascular) - Continue daily aspirin  - Continue metoprolol for rate control     Hypokalemia - Secondary to lasix - Repleted     Hypernatremia - Likely prerenal, dehydration, poor po intake - Continue IV fluids - Check BMP tomorrow - Sodium 153    Anemia of chronic kidney failrue  - Hemoglobin stable at 10.2, 10.1    Recurrent falls / Generalized weakness / Functional  quadriplegia  - PT eval if patient able to participate     Fecal impaction (HCC) - Had few BM in past 48 hours     Encounter for palliative care / Goals of care, counseling/discussion - PCT consulted for goals of care     Acute renal failure superimposed on stage 4 chronic kidney disease (HCC) - Baseline Cr 1.78 in 03/2015 - Cr on this admission 2.63 and further improving with fluids. Lasix on hold    DVT Prophylaxis  - SCD's bilaterally  Code Status: Full.  Family Communication:  plan of care discussed with the patient's daughter at the bedside Disposition Plan: likely by 06/01/2015.  IV access:  Peripheral IV  Procedures and diagnostic studies:    Ct Abdomen Pelvis Wo Contrast 05/27/2015  1. Acute diverticulitis at the proximal sigmoid colon, with focal wall thickening, surrounding soft tissue inflammation and trace free fluid. No evidence of perforation or abscess formation. Follow-up colonoscopy could be considered after completion of treatment, to exclude an underlying mass, as deemed clinically appropriate. 2. Dense stool noted filling the rectum, measuring up to 8.1 cm in transverse dimension, concerning for fecal impaction. 3. Scattered diverticulosis along the sigmoid colon. 4. Scattered calcification along the abdominal aorta and its branches. 5. Chronic scarring again noted at the lung bases. 6. Calcified uterine fibroid seen. 7. Tiny hiatal hernia seen. 8. New compression deformity at L2, and chronic compression deformity at L1.   Dg Chest Port 1 View 05/27/2015   Chronic fibrosis, bronchiectasis, and chronic bronchitic changes in the lungs. Superimposed opacity in the left lung base may  represent pneumonia or atelectasis.   Medical Consultants:  None   Other Consultants:  PT Nutrition  IAnti-Infectives:   Cipro and flagyl 05/28/2015 -->   Manson Passey, MD  [facility] Pager [phone]  Time spent in minutes: 15 minutes  If 7PM-7AM, please contact  night-coverage [email or web address] Password [facility] 05/31/2015, 10:31 AM   LOS: 3 days    HPI/Subjective: No acute overnight events. No respiratory distress.   Objective: Filed Vitals:   05/30/15 0622 05/30/15 1517 05/30/15 2040 05/31/15 0548  BP: 126/56 106/49 113/87 138/66  Pulse: 84 83 90 75  Temp: 97.9 F (36.6 C) 98.3 F (36.8 C) 97.8 F (36.6 C) 98.2 F (36.8 C)  TempSrc: Oral Oral Oral Oral  Resp: 18 18 18 18   Height:      Weight:      SpO2: 98% 97% 99% 96%    Intake/Output Summary (Last 24 hours) at 05/31/15 1031 Last data filed at 05/31/15 0500  Gross per 24 hour  Intake 2332.5 ml  Output      0 ml  Net 2332.5 ml    Exam:   General:  Pt is not acute distress  Cardiovascular: Rate controlled, S1, S2 (+)   Respiratory: No wheezing, no rhonchi   Abdomen: appreciate BS, no distention  Extremities: No leg swelling, palpable pulses bilaterally   Neuro: Nonfocal   Data Reviewed: Basic Metabolic Panel:  Recent Labs Lab 05/27/15 2226 05/28/15 1150 05/29/15 0810 05/30/15 0943 05/31/15 0840  NA 147* 147* 151* 152* 153*  K 4.0 3.4* 3.1* 3.4* 3.6  CL 109 114* 119* 123* 127*  CO2 27 23 21* 21* 21*  GLUCOSE 111* 97 86 89 127*  BUN 50* 42* 33* 21* 12  CREATININE 2.63* 2.28* 1.83* 1.42* 1.21*  CALCIUM 9.6 8.7* 8.9 8.4* 8.7*  MG  --  2.1  --   --   --   PHOS  --  3.1  --   --   --    Liver Function Tests:  Recent Labs Lab 05/27/15 2226  AST 27  ALT 11*  ALKPHOS 87  BILITOT 0.9  PROT 9.2*  ALBUMIN 3.0*    Recent Labs Lab 05/27/15 2226  LIPASE 31   No results for input(s): AMMONIA in the last 168 hours. CBC:  Recent Labs Lab 05/27/15 2226 05/28/15 1150 05/29/15 0810 05/31/15 0840  WBC 17.4* 13.3* 11.8* 10.9*  NEUTROABS 13.7*  --   --   --   HGB 12.1 10.0* 10.2* 10.1*  HCT 35.8* 30.1* 31.1* 31.3*  MCV 71.7* 72.0* 72.5* 72.3*  PLT 373 321 315 259   Cardiac Enzymes: No results for input(s): CKTOTAL, CKMB, CKMBINDEX, TROPONINI in the  last 168 hours. BNP: Invalid input(s): POCBNP CBG: No results for input(s): GLUCAP in the last 168 hours.  Urine culture     Status: None (Preliminary result)   Collection Time: 05/27/15 11:58 PM  Result Value Ref Range Status   Specimen Description URINE, CLEAN CATCH  Final   Special Requests NONE  Final   Culture   Final    CULTURE REINCUBATED FOR BETTER GROWTH    Report Status PENDING  Incomplete  MRSA PCR Screening     Status: Abnormal   Collection Time: 05/28/15  2:26 PM  Result Value Ref Range Status   MRSA by PCR POSITIVE (A) NEGATIVE Final     Scheduled Meds: . aspirin EC  81 mg Oral Daily  . ciprofloxacin  400 mg Intravenous QHS  . docusate sodium  100 mg Oral Daily  . metoprolol  2.5 mg Intravenous 4 times per day  . metronidazole  500 mg Intravenous Q8H  . mirtazapine  15 mg Oral QHS  . mupirocin ointment   Nasal BID  . pantoprazole  40 mg Oral Daily  . potassium chloride  40 mEq Oral Once  . predniSONE  5 mg Oral q morning - 10a   Continuous Infusions: . dextrose 5 % and 0.9 % NaCl with KCl 20 mEq/L 75 mL/hr at 05/30/15 1209

## 2015-05-31 NOTE — Progress Notes (Signed)
Initial Nutrition Assessment  DOCUMENTATION CODES:   Non-severe (moderate) malnutrition in context of chronic illness  INTERVENTION:   -Provide Ensure Enlive po BID, each supplement provides 350 kcal and 20 grams of protein -Encourage PO intake -RD to continue to monitor  NUTRITION DIAGNOSIS:   Malnutrition related to chronic illness as evidenced by energy intake < or equal to 75% for > or equal to 1 month, moderate depletion of body fat, moderate depletions of muscle mass.  GOAL:   Patient will meet greater than or equal to 90% of their needs  MONITOR:   PO intake, Supplement acceptance, Labs, Weight trends, Skin, I & O's  REASON FOR ASSESSMENT:   Consult Assessment of nutrition requirement/status  ASSESSMENT:   81 year old female with a past medical history significant for dementia, history of stroke and intracranial bleed in past, atrial fibrillation (on aspirin only). Pt presented [facility] ED with reports of right side abdominal pain associated with decreased bowel movement, decreased appetite and overall weakness. She is nonambulatory since recent fall in 04/2015.  Patient asleep with daughter at bedside. Per daughter, pt has had poor appetite and has not been eating well for months. Pt currently eating 45-75% of regular diet. Pt having trouble chewing as she does not have her denture in d/t agitation earlier. Pt's daughter says she does better with soft foods and liquids that she can drink. RD to order Ensure BID.   Pt's weight has remained stable.  Nutrition-Focused physical exam completed. Findings are moderate fat depletion, moderate muscle depletion, and no edema.  Labs reviewed: Elevated Na, Creatinine Mg/Phos WNL  Diet Order:  Diet regular Room service appropriate?: Yes; Fluid consistency:: Thin  Skin:  Wound (see comment) (back wound)  Last BM:  11/23  Height:   Ht Readings from Last 1 Encounters:  05/28/15 5\' 1"  (1.549 m)    Weight:   Wt Readings  from Last 1 Encounters:  05/28/15 135 lb (61.236 kg)    Ideal Body Weight:  47.7 kg  BMI:  Body mass index is 25.52 kg/(m^2).  Estimated Nutritional Needs:   Kcal:  1550-1750  Protein:  70-80g  Fluid:  1.7L/day  EDUCATION NEEDS:   No education needs identified at this time  05/30/15, MS, RD, LDN Pager: [phone] After Hours Pager: [phone]

## 2015-06-01 LAB — BASIC METABOLIC PANEL
Anion gap: 5 (ref 5–15)
BUN: 7 mg/dL (ref 6–20)
CO2: 19 mmol/L — ABNORMAL LOW (ref 22–32)
Calcium: 8.3 mg/dL — ABNORMAL LOW (ref 8.9–10.3)
Chloride: 127 mmol/L — ABNORMAL HIGH (ref 101–111)
Creatinine, Ser: 1.04 mg/dL — ABNORMAL HIGH (ref 0.44–1.00)
GFR calc Af Amer: 52 mL/min — ABNORMAL LOW (ref >60)
GFR calc non Af Amer: 45 mL/min — ABNORMAL LOW (ref >60)
Glucose, Bld: 101 mg/dL — ABNORMAL HIGH (ref 65–99)
Potassium: 4.1 mmol/L (ref 3.5–5.1)
Sodium: 151 mmol/L — ABNORMAL HIGH (ref 135–145)

## 2015-06-01 NOTE — Progress Notes (Signed)
I met briefly with Ms. Brizuela daughter this evening. She is hopeful that her mother will be able to go home tomorrow.  She denies having any questions about our conversations regarding goals of care and reported again that she will need to talk to her mother's PCP before making any decisions.  I reviewed again my thougts that the hospital can be useful as long as her mother is getting well enough from care she receives at the hospital to enjoy time at home, but we are reaching a point where, if her goal is to be at home, she may be better served to plan on being at home and bringing care to her at home rather repeated trips to the hospital. I recommended that she speak with her mother's PCP to determine if she may be better served by focusing his care on staying at home with support of organization such as hospice.  Micheline Rough, MD [facility] [phone]

## 2015-06-01 NOTE — Progress Notes (Addendum)
Patient ID: Shelly [name], female   DOB: 1919/06/07, 82 y.o.   MRN: [id number] [facility] PROGRESS NOTE  CHERYLIN WAGUESPACK MBW:[id number] DOB: 02-04-1919 DOA: 05/27/2015 PCP: Cain Saupe, MD  Brief narrative:    81 year old female with a past medical history significant for dementia, history of stroke and intracranial bleed in past, atrial fibrillation (on aspirin only). Pt presented to [facility] ED with reports of right side abdominal pain associated with decreased bowel movement, decreased appetite and overall weakness. She is nonambulatory since recent fall in 04/2015.  She was found to have acute diverticulitis and was started on cipro and flagyl.   Anticipated discharge: if sodium less than 150 can go home 11/27.  Assessment/Plan:    Principal Problem:   Acute diverticulitis in proximal sigmoid colon / Abdominal pain, right side - CT scan on admission showed diverticulitis in proximal sigmoid colon with focal wall thickening without perforation or abscess.  - We will continue cipro and flagyl - Diet as tolerated   Active Problems:   Rheumatoid arthritis (HCC) / Leukocytosis - Continue low dose prednisone per home regimen     Hypertension, essential - Continue metoprolol     Acute encephalopathy / Dementia / History of hemorrhagic stroke with residual hemiparesis (HCC) - Likely secondary to stroke, mostly non verbal - Continue daily aspirin    Paroxysmal atrial fibrillation (HCC) - CHADS vasc score at least 5-6 (age, htn, stroke, vascular) - Continue aspirin  - Continue metoprolol    Hypokalemia - Secondary to lasix - Supplemented and WNL    Hypernatremia - Likely prerenal, dehydration, poor po intake - Continue IV fluids - Sodium improving     Anemia of chronic kidney failrue  - Hemoglobin stable at 10.1    Recurrent falls / Generalized weakness / Functional quadriplegia  - Home with HHPT    Fecal impaction (HCC) - Had few BM in past 48 hours     Encounter for  palliative care / Goals of care, counseling/discussion - PCT consulted for goals of care, appreciate their input     Acute renal failure superimposed on stage 4 chronic kidney disease (HCC) - Baseline Cr 1.78 in 03/2015 - Cr on this admission 2.63 and further improving with fluids.     Moderate protein calorie malnutrition - Nutrition consulted    DVT Prophylaxis  - SCD's bilaterally in hospital   Code Status: Full.  Family Communication:  plan of care discussed with the patient's daughter at the bedside Disposition Plan: likely by 06/02/2015.  IV access:  Peripheral IV  Procedures and diagnostic studies:    Ct Abdomen Pelvis Wo Contrast 05/27/2015  1. Acute diverticulitis at the proximal sigmoid colon, with focal wall thickening, surrounding soft tissue inflammation and trace free fluid. No evidence of perforation or abscess formation. Follow-up colonoscopy could be considered after completion of treatment, to exclude an underlying mass, as deemed clinically appropriate. 2. Dense stool noted filling the rectum, measuring up to 8.1 cm in transverse dimension, concerning for fecal impaction. 3. Scattered diverticulosis along the sigmoid colon. 4. Scattered calcification along the abdominal aorta and its branches. 5. Chronic scarring again noted at the lung bases. 6. Calcified uterine fibroid seen. 7. Tiny hiatal hernia seen. 8. New compression deformity at L2, and chronic compression deformity at L1.   Dg Chest Port 1 View 05/27/2015   Chronic fibrosis, bronchiectasis, and chronic bronchitic changes in the lungs. Superimposed opacity in the left lung base may represent pneumonia or atelectasis.   Medical Consultants:  None   Other Consultants:  PT Nutrition  IAnti-Infectives:   Cipro and flagyl 05/28/2015 -->   Manson Passey, MD  [facility] Pager [phone]  Time spent in minutes: 15 minutes  If 7PM-7AM, please contact night-coverage [email or web address] Password  [email or web address] 06/01/2015, 1:27 PM   LOS: 4 days    HPI/Subjective: No acute overnight events.   Objective: Filed Vitals:   05/31/15 0548 05/31/15 1450 05/31/15 2143 06/01/15 0648  BP: 138/66 116/58 132/58 124/53  Pulse: 75 86 78 83  Temp: 98.2 F (36.8 C) 98 F (36.7 C) 98.3 F (36.8 C) 98.7 F (37.1 C)  TempSrc: Oral Axillary Oral Axillary  Resp: 18 18 18 18   Height:      Weight:      SpO2: 96% 100% 100% 100%    Intake/Output Summary (Last 24 hours) at 06/01/15 1327 Last data filed at 05/31/15 1825  Gross per 24 hour  Intake    900 ml  Output      0 ml  Net    900 ml    Exam:   General:  Pt is awake, no distress   Cardiovascular: RRR, (+) S1, S2  Respiratory: bilateral air entry, no wheezing   Abdomen: (+) BS, no distention    Data Reviewed: Basic Metabolic Panel:  Recent Labs Lab 05/28/15 1150 05/29/15 0810 05/30/15 0943 05/31/15 0840 06/01/15 0814  NA 147* 151* 152* 153* 151*  K 3.4* 3.1* 3.4* 3.6 4.1  CL 114* 119* 123* 127* 127*  CO2 23 21* 21* 21* 19*  GLUCOSE 97 86 89 127* 101*  BUN 42* 33* 21* 12 7  CREATININE 2.28* 1.83* 1.42* 1.21* 1.04*  CALCIUM 8.7* 8.9 8.4* 8.7* 8.3*  MG 2.1  --   --   --   --   PHOS 3.1  --   --   --   --    Liver Function Tests:  Recent Labs Lab 05/27/15 2226  AST 27  ALT 11*  ALKPHOS 87  BILITOT 0.9  PROT 9.2*  ALBUMIN 3.0*    Recent Labs Lab 05/27/15 2226  LIPASE 31   No results for input(s): AMMONIA in the last 168 hours. CBC:  Recent Labs Lab 05/27/15 2226 05/28/15 1150 05/29/15 0810 05/31/15 0840  WBC 17.4* 13.3* 11.8* 10.9*  NEUTROABS 13.7*  --   --   --   HGB 12.1 10.0* 10.2* 10.1*  HCT 35.8* 30.1* 31.1* 31.3*  MCV 71.7* 72.0* 72.5* 72.3*  PLT 373 321 315 259   Cardiac Enzymes: No results for input(s): CKTOTAL, CKMB, CKMBINDEX, TROPONINI in the last 168 hours. BNP: Invalid input(s): POCBNP CBG: No results for input(s): GLUCAP in the last 168 hours.  Urine culture     Status: None  (Preliminary result)   Collection Time: 05/27/15 11:58 PM  Result Value Ref Range Status   Specimen Description URINE, CLEAN CATCH  Final   Special Requests NONE  Final   Culture   Final    CULTURE REINCUBATED FOR BETTER GROWTH    Report Status PENDING  Incomplete  MRSA PCR Screening     Status: Abnormal   Collection Time: 05/28/15  2:26 PM  Result Value Ref Range Status   MRSA by PCR POSITIVE (A) NEGATIVE Final     Scheduled Meds: . aspirin EC  81 mg Oral Daily  . ciprofloxacin  400 mg Intravenous QHS  . docusate sodium  100 mg Oral Daily  . metoprolol  2.5 mg Intravenous 4 times per day  .  metronidazole  500 mg Intravenous Q8H  . mirtazapine  15 mg Oral QHS  . mupirocin ointment   Nasal BID  . pantoprazole  40 mg Oral Daily  . potassium chloride  40 mEq Oral Once  . predniSONE  5 mg Oral q morning - 10a   Continuous Infusions: . dextrose 5 % and 0.9 % NaCl with KCl 20 mEq/L 75 mL/hr at 06/01/15 1324

## 2015-06-02 LAB — BASIC METABOLIC PANEL
Anion gap: 6 (ref 5–15)
BUN: 6 mg/dL (ref 6–20)
CO2: 19 mmol/L — ABNORMAL LOW (ref 22–32)
Calcium: 8.3 mg/dL — ABNORMAL LOW (ref 8.9–10.3)
Chloride: 125 mmol/L — ABNORMAL HIGH (ref 101–111)
Creatinine, Ser: 0.95 mg/dL (ref 0.44–1.00)
GFR calc Af Amer: 58 mL/min — ABNORMAL LOW (ref >60)
GFR calc non Af Amer: 50 mL/min — ABNORMAL LOW (ref >60)
Glucose, Bld: 97 mg/dL (ref 65–99)
Potassium: 3.7 mmol/L (ref 3.5–5.1)
Sodium: 150 mmol/L — ABNORMAL HIGH (ref 135–145)

## 2015-06-02 MED ORDER — ENSURE ENLIVE PO LIQD: 237.0000 mL | Freq: Two times a day (BID) | ORAL | Status: DC

## 2015-06-02 MED ORDER — CIPROFLOXACIN 500 MG/5ML (10%) PO SUSR: 500.0000 mg | Freq: Two times a day (BID) | ORAL | Status: DC

## 2015-06-02 MED ORDER — METRONIDAZOLE 50 MG/ML ORAL SUSPENSION: 500.0000 mg | Freq: Three times a day (TID) | ORAL | Status: DC

## 2015-06-02 NOTE — Progress Notes (Signed)
Pt left at this time with EMS headed home with daughter present. Discharge instructions/prescriptions given/explained to daughter.  Pt alert on discharge.

## 2015-06-02 NOTE — Clinical Social Work Note (Signed)
CSW received a call from RN requesting ambulance home  CSW met with pt's daughter to confirm address, provided discharge/PTAR paperwork  to RN and called [facility]  .Dede Query, LCSW [facility] Clinical Social Worker - Weekend Coverage cell #: [phone]

## 2015-06-02 NOTE — Discharge Summary (Signed)
Physician Discharge Summary  Shelly [name] NFA:[id number] DOB: 06-04-1919 DOA: 05/27/2015  PCP: Cain Saupe, MD  Admit date: 05/27/2015 Discharge date: 06/02/2015  Recommendations for Outpatient Follow-up:  Continue cipro and flagyl for 3 more days on discharge.  Discharge Diagnoses:  Principal Problem:   Acute diverticulitis Active Problems:   Abdominal pain   Rheumatoid arthritis (HCC)   Hypertension   Acute encephalopathy   History of hemorrhagic stroke with residual hemiparesis (HCC)   Dementia   Paroxysmal atrial fibrillation (HCC)   Recurrent falls   Generalized weakness   Fecal impaction (HCC)   Encounter for palliative care   Goals of care, counseling/discussion   Acute renal failure superimposed on stage 4 chronic kidney disease (HCC)   Hypokalemia   Anemia of chronic kidney failure   Leukocytosis   Functional quadriplegia (HCC)   Hypernatremia    Discharge Condition: stable   Diet recommendation: as tolerated   History of present illness:  81 year old female with a past medical history significant for dementia, history of stroke and intracranial bleed in past, atrial fibrillation (on aspirin only). Pt presented to [facility] ED with reports of right side abdominal pain associated with decreased bowel movement, decreased appetite and overall weakness. She is nonambulatory since recent fall in 04/2015.  She was found to have acute diverticulitis and was started on cipro and flagyl.   Hospital Course:   Assessment/Plan:    Principal Problem:  Acute diverticulitis in proximal sigmoid colon / Abdominal pain, right side - CT scan on admission showed diverticulitis in proximal sigmoid colon with focal wall thickening without perforation or abscess.  - Continue cipro and flagyl for 3 more days on discharge  - Diet as tolerated   Active Problems:  Rheumatoid arthritis (HCC) / Leukocytosis - Continue low dose prednisone per home dose    Hypertension,  essential - Continue metoprolol on discharge    Acute encephalopathy / Dementia / History of hemorrhagic stroke with residual hemiparesis (HCC) - Secondary to stroke, mostly non verbal - Continue daily aspirin on discharge    Paroxysmal atrial fibrillation (HCC) - CHADS vasc score at least 5-6 (age, htn, stroke, vascular) - Continue aspirin and metoprolol - No reports of bleeding   Hypokalemia - Secondary to lasix - Supplemented and WNL   Hypernatremia - Likely prerenal, dehydration, poor po intake - IV fluids given in hospital  - Sodium improving to 150 this am - Encourage hydration at home    Anemia of chronic kidney failrue  - Hemoglobin stable - No bleeding    Recurrent falls / Generalized weakness / Functional quadriplegia  - Home with HHPT orders placed    Fecal impaction (HCC) - Had BM   Encounter for palliative care / Goals of care, counseling/discussion - PCT consulted for goals of care, appreciate their input    Acute renal failure superimposed on stage 4 chronic kidney disease (HCC) - Baseline Cr 1.78 in 03/2015 - Cr on this admission 2.63 and normalized with IV fluids    Moderate protein calorie malnutrition - Continue nutritional supplementation    DVT Prophylaxis  - SCD's bilaterally   Code Status: Full.  Family Communication: plan of care discussed with the patient's daughter at the bedside  IV access:  Peripheral IV  Procedures and diagnostic studies:   Ct Abdomen Pelvis Wo Contrast 05/27/2015 1. Acute diverticulitis at the proximal sigmoid colon, with focal wall thickening, surrounding soft tissue inflammation and trace free fluid. No evidence of perforation or abscess formation. Follow-up colonoscopy  could be considered after completion of treatment, to exclude an underlying mass, as deemed clinically appropriate. 2. Dense stool noted filling the rectum, measuring up to 8.1 cm in transverse dimension, concerning for fecal  impaction. 3. Scattered diverticulosis along the sigmoid colon. 4. Scattered calcification along the abdominal aorta and its branches. 5. Chronic scarring again noted at the lung bases. 6. Calcified uterine fibroid seen. 7. Tiny hiatal hernia seen. 8. New compression deformity at L2, and chronic compression deformity at L1.   Dg Chest Port 1 View 05/27/2015 Chronic fibrosis, bronchiectasis, and chronic bronchitic changes in the lungs. Superimposed opacity in the left lung base may represent pneumonia or atelectasis.   Medical Consultants:  None   Other Consultants:  PT Nutrition  IAnti-Infectives:   Cipro and flagyl 05/28/2015 --> for 3 days on discharge     Signed:  Manson Passey, MD  [facility] 06/02/2015, 11:04 AM  Pager #: [phone]  Time spent in minutes: more than 30 minutes   Discharge Exam: Filed Vitals:   06/01/15 2148 06/02/15 0544  BP: 125/70 120/68  Pulse: 86 79  Temp: 97.1 F (36.2 C) 97.4 F (36.3 C)  Resp: 19 19   Filed Vitals:   06/01/15 0648 06/01/15 1422 06/01/15 2148 06/02/15 0544  BP: 124/53 96/75 125/70 120/68  Pulse: 83 92 86 79  Temp: 98.7 F (37.1 C) 98 F (36.7 C) 97.1 F (36.2 C) 97.4 F (36.3 C)  TempSrc: Axillary Axillary Axillary Axillary  Resp: 18 18 19 19   Height:      Weight:      SpO2: 100% 100% 98% 97%    General: Pt is alert, not in acute distress Cardiovascular: Regular rate and rhythm, S1/S2 + Respiratory: Clear to auscultation bilaterally, no wheezing, no crackles, no rhonchi Abdominal: Soft, non tender, non distended, bowel sounds +, no guarding Extremities: no edema, no cyanosis, pulses palpable bilaterally DP and PT Neuro: Grossly nonfocal  Discharge Instructions  Discharge Instructions    Call MD for:  difficulty breathing, headache or visual disturbances    Complete by:  As directed      Call MD for:  persistant dizziness or light-headedness    Complete by:  As directed      Call MD for:   persistant nausea and vomiting    Complete by:  As directed      Call MD for:  severe uncontrolled pain    Complete by:  As directed      Diet - low sodium heart healthy    Complete by:  As directed      Discharge instructions    Complete by:  As directed   Continue cipro and flagyl for 3 more days on discharge.     Increase activity slowly    Complete by:  As directed             Medication List    STOP taking these medications        ciprofloxacin 500 MG tablet  Commonly known as:  CIPRO     imipenem-cilastatin 250 mg in sodium chloride 0.9 % 100 mL     metroNIDAZOLE 500 MG tablet  Commonly known as:  FLAGYL  Replaced by:  metroNIDAZOLE 50 mg/ml oral suspension     saccharomyces boulardii 250 MG capsule  Commonly known as:  FLORASTOR     senna-docusate 8.6-50 MG tablet  Commonly known as:  Senokot-S      TAKE these medications  ALPRAZolam 0.25 MG tablet  Commonly known as:  XANAX  Take 0.125 mg by mouth 2 (two) times daily as needed for anxiety.     aspirin EC 81 MG tablet  Take 1 tablet (81 mg total) by mouth daily.     ciprofloxacin 500 MG/5ML (10%) suspension  Commonly known as:  CIPRO  Take 5 mLs (500 mg total) by mouth 2 (two) times daily.     docusate sodium 100 MG capsule  Commonly known as:  COLACE  Take 100 mg by mouth daily as needed for mild constipation.     feeding supplement (ENSURE ENLIVE) Liqd  Take 237 mLs by mouth 2 (two) times daily between meals.     feeding supplement (ENSURE ENLIVE) Liqd  Take 237 mLs by mouth 2 (two) times daily between meals.     ferrous sulfate 325 (65 FE) MG tablet  Take 325 mg by mouth daily with breakfast.     furosemide 20 MG tablet  Commonly known as:  LASIX  Take 1 tablet (20 mg total) by mouth daily.     HYDROcodone-acetaminophen 5-325 MG tablet  Commonly known as:  NORCO/VICODIN  Take 1-2 tablets by mouth every 8 (eight) hours as needed. Severe pain.     magnesium oxide 400 (241.3 MG) MG  tablet  Commonly known as:  MAG-OX  Take 1 tablet (400 mg total) by mouth daily.     metoprolol tartrate 25 MG tablet  Commonly known as:  LOPRESSOR  Take 1 tablet (25 mg total) by mouth 2 (two) times daily.     metroNIDAZOLE 50 mg/ml oral suspension  Commonly known as:  FLAGYL  Take 10 mLs (500 mg total) by mouth 3 (three) times daily.     mirtazapine 15 MG tablet  Commonly known as:  REMERON  Take 15 mg by mouth at bedtime.     pantoprazole 40 MG tablet  Commonly known as:  PROTONIX  Take 40 mg by mouth daily.     predniSONE 5 MG tablet  Commonly known as:  DELTASONE  Take 5 mg by mouth every morning.           Follow-up Information    Follow up with [facility].   Why:  Home Health RN, Physical Therapy, Occupational Therapy and aide   Contact information:   [address] [place] [address] [phone]       Follow up with FULP, CAMMIE, MD. Schedule an appointment as soon as possible for a visit in 1 week.   Specialty:  Family Medicine   Why:  Follow up appt after recent hospitalization   Contact information:   [address] [place] [address] [phone]        The results of significant diagnostics from this hospitalization (including imaging, microbiology, ancillary and laboratory) are listed below for reference.    Significant Diagnostic Studies: Ct Abdomen Pelvis Wo Contrast  05/27/2015  CLINICAL DATA:  Subacute onset of worsening confusion, and leaning toward the right side. Nausea and vomiting. Diffuse right-sided abdominal pain. Initial encounter. EXAM: CT ABDOMEN AND PELVIS WITHOUT CONTRAST TECHNIQUE: Multidetector CT imaging of the abdomen and pelvis was performed following the standard protocol without IV contrast. COMPARISON:  CT of the abdomen and pelvis performed 03/25/2015 FINDINGS: Bibasilar opacities may reflect chronic scarring, as they are similar in appearance to the prior study. A tiny hiatal hernia is  noted. The liver and spleen are unremarkable in appearance. The gallbladder is within normal  limits. The pancreas and adrenal glands are unremarkable. A 1.8 cm cyst is noted at the interpole region of the right kidney. The kidneys are otherwise unremarkable. There is no evidence of hydronephrosis. No renal or ureteral stones are seen. No perinephric stranding is appreciated. The small bowel is unremarkable in appearance. The stomach is otherwise within normal limits. No acute vascular abnormalities are seen. Scattered calcification is noted along the abdominal aorta and its branches. The appendix is normal in caliber and contains air, without evidence for appendicitis. Focal wall thickening is noted along the proximal sigmoid colon, with surrounding soft tissue inflammation and trace free fluid, compatible with acute diverticulitis. Scattered diverticulosis is noted along the sigmoid colon. Dense stool is noted filling the rectum, measuring up to 8.1 cm in transverse dimension, concerning for fecal impaction. The bladder is moderately distended and grossly unremarkable. A calcified uterine fibroid is noted. The uterus is otherwise unremarkable. The ovaries are relatively symmetric. No suspicious adnexal masses are seen. No inguinal lymphadenopathy is seen. No acute osseous abnormalities are identified. Compression deformities are noted at L1 and L2, new at L2 since the prior study. IMPRESSION: 1. Acute diverticulitis at the proximal sigmoid colon, with focal wall thickening, surrounding soft tissue inflammation and trace free fluid. No evidence of perforation or abscess formation. Follow-up colonoscopy could be considered after completion of treatment, to exclude an underlying mass, as deemed clinically appropriate. 2. Dense stool noted filling the rectum, measuring up to 8.1 cm in transverse dimension, concerning for fecal impaction. 3. Scattered diverticulosis along the sigmoid colon. 4. Scattered calcification  along the abdominal aorta and its branches. 5. Chronic scarring again noted at the lung bases. 6. Calcified uterine fibroid seen. 7. Tiny hiatal hernia seen. 8. New compression deformity at L2, and chronic compression deformity at L1. Electronically Signed   By: Roanna Raider M.D.   On: 05/27/2015 23:11   Dg Chest Port 1 View  05/27/2015  CLINICAL DATA:  Vomiting and weakness. EXAM: PORTABLE CHEST 1 VIEW COMPARISON:  03/1915 FINDINGS: Cardiac enlargement without definite vascular congestion. Diffuse bronchial wall thickening with central interstitial fibrosis likely due to chronic bronchitis. Mild bronchiectasis in the lower lobes. Superimposed opacity on the left lung base suggesting superimposed consolidation. This may be due to pneumonia or atelectasis. No pneumothorax. No blunting of costophrenic angles. IMPRESSION: Chronic fibrosis, bronchiectasis, and chronic bronchitic changes in the lungs. Superimposed opacity in the left lung base may represent pneumonia or atelectasis. Electronically Signed   By: Burman Nieves M.D.   On: 05/27/2015 22:02    Microbiology: Recent Results (from the past 240 hour(s))  Urine culture     Status: None   Collection Time: 05/27/15 11:58 PM  Result Value Ref Range Status   Specimen Description URINE, CLEAN CATCH  Final   Special Requests NONE  Final   Culture   Final    MULTIPLE SPECIES PRESENT, SUGGEST RECOLLECTION Performed at [facility]    Report Status 05/30/2015 FINAL  Final  MRSA PCR Screening     Status: Abnormal   Collection Time: 05/28/15  2:26 PM  Result Value Ref Range Status   MRSA by PCR POSITIVE (A) NEGATIVE Final    Comment:        The GeneXpert MRSA Assay (FDA approved for NASAL specimens only), is one component of a comprehensive MRSA colonization surveillance program. It is not intended to diagnose MRSA infection nor to guide or monitor treatment for MRSA infections. RESULT CALLED TO, READ BACK BY AND  VERIFIED  WITH: Linton Ham [id number] @ 1552 BY J SCOTTON      Labs: Basic Metabolic Panel:  Recent Labs Lab 05/28/15 1150 05/29/15 0810 05/30/15 0943 05/31/15 0840 06/01/15 0814 06/02/15 0537  NA 147* 151* 152* 153* 151* 150*  K 3.4* 3.1* 3.4* 3.6 4.1 3.7  CL 114* 119* 123* 127* 127* 125*  CO2 23 21* 21* 21* 19* 19*  GLUCOSE 97 86 89 127* 101* 97  BUN 42* 33* 21* 12 7 6   CREATININE 2.28* 1.83* 1.42* 1.21* 1.04* 0.95  CALCIUM 8.7* 8.9 8.4* 8.7* 8.3* 8.3*  MG 2.1  --   --   --   --   --   PHOS 3.1  --   --   --   --   --    Liver Function Tests:  Recent Labs Lab 05/27/15 2226  AST 27  ALT 11*  ALKPHOS 87  BILITOT 0.9  PROT 9.2*  ALBUMIN 3.0*    Recent Labs Lab 05/27/15 2226  LIPASE 31   No results for input(s): AMMONIA in the last 168 hours. CBC:  Recent Labs Lab 05/27/15 2226 05/28/15 1150 05/29/15 0810 05/31/15 0840  WBC 17.4* 13.3* 11.8* 10.9*  NEUTROABS 13.7*  --   --   --   HGB 12.1 10.0* 10.2* 10.1*  HCT 35.8* 30.1* 31.1* 31.3*  MCV 71.7* 72.0* 72.5* 72.3*  PLT 373 321 315 259   Cardiac Enzymes: No results for input(s): CKTOTAL, CKMB, CKMBINDEX, TROPONINI in the last 168 hours. BNP: BNP (last 3 results)  Recent Labs  02/08/15 2055  BNP 157.9*    ProBNP (last 3 results) No results for input(s): PROBNP in the last 8760 hours.  CBG: No results for input(s): GLUCAP in the last 168 hours.

## 2015-06-02 NOTE — Discharge Instructions (Signed)
Diverticulitis Diverticulitis is inflammation or infection of small pouches in your colon that form when you have a condition called diverticulosis. The pouches in your colon are called diverticula. Your colon, or large intestine, is where water is absorbed and stool is formed. Complications of diverticulitis can include:  Bleeding.  Severe infection.  Severe pain.  Perforation of your colon.  Obstruction of your colon. CAUSES  Diverticulitis is caused by bacteria. Diverticulitis happens when stool becomes trapped in diverticula. This allows bacteria to grow in the diverticula, which can lead to inflammation and infection. RISK FACTORS People with diverticulosis are at risk for diverticulitis. Eating a diet that does not include enough fiber from fruits and vegetables may make diverticulitis more likely to develop. SYMPTOMS  Symptoms of diverticulitis may include:  Abdominal pain and tenderness. The pain is normally located on the left side of the abdomen, but may occur in other areas.  Fever and chills.  Bloating.  Cramping.  Nausea.  Vomiting.  Constipation.  Diarrhea.  Blood in your stool. DIAGNOSIS  Your health care provider will ask you about your medical history and do a physical exam. You may need to have tests done because many medical conditions can cause the same symptoms as diverticulitis. Tests may include:  Blood tests.  Urine tests.  Imaging tests of the abdomen, including X-rays and CT scans. When your condition is under control, your health care provider may recommend that you have a colonoscopy. A colonoscopy can show how severe your diverticula are and whether something else is causing your symptoms. TREATMENT  Most cases of diverticulitis are mild and can be treated at home. Treatment may include:  Taking over-the-counter pain medicines.  Following a clear liquid diet.  Taking antibiotic medicines by mouth for 7-10 days. More severe cases may  be treated at a hospital. Treatment may include:  Not eating or drinking.  Taking prescription pain medicine.  Receiving antibiotic medicines through an IV tube.  Receiving fluids and nutrition through an IV tube.  Surgery. HOME CARE INSTRUCTIONS   Follow your health care provider's instructions carefully.  Follow a full liquid diet or other diet as directed by your health care provider. After your symptoms improve, your health care provider may tell you to change your diet. He or she may recommend you eat a high-fiber diet. Fruits and vegetables are good sources of fiber. Fiber makes it easier to pass stool.  Take fiber supplements or probiotics as directed by your health care provider.  Only take medicines as directed by your health care provider.  Keep all your follow-up appointments. SEEK MEDICAL CARE IF:   Your pain does not improve.  You have a hard time eating food.  Your bowel movements do not return to normal. SEEK IMMEDIATE MEDICAL CARE IF:   Your pain becomes worse.  Your symptoms do not get better.  Your symptoms suddenly get worse.  You have a fever.  You have repeated vomiting.  You have bloody or black, tarry stools. MAKE SURE YOU:   Understand these instructions.  Will watch your condition.  Will get help right away if you are not doing well or get worse.   This information is not intended to replace advice given to you by your health care provider. Make sure you discuss any questions you have with your health care provider.   Document Released: 04/01/2005 Document Revised: 06/27/2013 Document Reviewed: 05/17/2013 Elsevier Interactive Patient Education 2016 [employer] Hypernatremia Hypernatremia is too much salt (sodium) in the  blood. This happens when there is a shortage of water in the body. The balance of water and sodium in the body is vital to human function. When hypernatremia happens, the cells of the brain can become starved of  water. Hypernatremia can happen to anyone, but it usually happens in people who may not drink enough fluids, such as elderly people who may depend on others to get fluids.In severe cases, hypernatremia can be life threatening. CAUSES This condition may be caused by:   Not drinking enough water. This may happen because of a decreased ability to feel thirst.  A condition in which the kidneys remove too much urine from the body (polyuria). This is often related to diabetes.  Certain medicines, such as diuretics.   Water loss through exercise or sweating.   Diarrhea or vomiting.   Excessive sodium intake.   A genetic condition that affects the kidneys.  SYMPTOMS Symptoms of this condition may include:   Increased thirst. You may not have this symptom if your ability to feel thirsty is decreased.  Increased urination.   Muscle twitching or cramps.   Dizziness or light-headedness.   Seizures.   Headache.   Fatigue.   Generalized weakness.   Irritability.   Confusion.   Loss of consciousness.   Coma.  Sometimes, hypernatremia is caused by an underlying condition. In this case, symptoms may include:   Nausea.   Vomiting.   Diarrhea.   Fever.   Confusion (delirium).   Memory problems (dementia).   Diabetes.   Cushing syndrome. This condition affects the glands at the top of your kidneys (adrenal glands) that produce the hormone cortisol. DIAGNOSIS  This condition may be diagnosed based on:   Your symptoms and medical history. Your health care provider will ask about your water intake, potential water loss, urination, salt intake, medicines, and other symptoms.  A physical or neurological exam.   Tests, including blood and urine tests.  TREATMENT  Depending on the severity, this condition may need to be treated right away. It often requires hospitalization. The goal of treatment is to balance the water and salt levels in your body  and to find the underlying cause of your water loss. Treatment may be given through an IV tube or by mouth. Treatment may include the following:  Increased water intake.  IV fluids.   Adjustment of medicines that you are taking.  Fever management.   Diabetes control.  Ongoing lab tests will be done to help guide treatment options.  HOME CARE INSTRUCTIONS   Drink enough fluid to keep your urine clear or pale yellow.  Take over-the-counter and prescription medicines only as told by your health care provider.  Avoid salty processed foods, including canned, jarred, frozen, or boxed foods. Some examples include pickles, frozen dinners, canned soups, potato and corn chips, and olives.  Always drink fluids after exercise or after vomiting or diarrhea.  Keep all follow-up visits as told by your health care provider. This is important. SEEK MEDICAL CARE IF:   You have diarrhea or vomiting.   You have a fever or chills.   You are unable to follow recommendations from your health care provider about fluid intake. SEEK IMMEDIATE MEDICAL CARE IF:  You are light-headed and weak.   You have a fast heartbeat.   You become confused or disoriented.   You become irritable or restless.   You have a seizure.   You faint.   You have signs of dehydration, such as:  Dark urine, or very little or no urine.   Cracked lips.   No tears.   Dry mouth.   Sunken eyes.   Sleepiness.   This information is not intended to replace advice given to you by your health care provider. Make sure you discuss any questions you have with your health care provider.   Document Released: 09/14/2011 Document Revised: 03/13/2015 Document Reviewed: 11/07/2014 Elsevier Interactive Patient Education [employer]

## 2015-06-13 ENCOUNTER — Inpatient Hospital Stay (HOSPITAL_COMMUNITY)
Admission: EM | Admit: 2015-06-13 | Discharge: 2015-06-16 | DRG: 308 | Disposition: A | Discharge status: Hospice | Payer: Medicare Other | Attending: Internal Medicine | Admitting: Internal Medicine

## 2015-06-13 ENCOUNTER — Encounter (HOSPITAL_COMMUNITY): Payer: Self-pay | Admitting: Emergency Medicine

## 2015-06-13 ENCOUNTER — Emergency Department (HOSPITAL_COMMUNITY): Payer: Medicare Other

## 2015-06-13 DIAGNOSIS — N184 Chronic kidney disease, stage 4 (severe): Secondary | ICD-10-CM | POA: Diagnosis present

## 2015-06-13 DIAGNOSIS — Z66 Do not resuscitate: Secondary | ICD-10-CM

## 2015-06-13 DIAGNOSIS — A419 Sepsis, unspecified organism: Secondary | ICD-10-CM | POA: Diagnosis present

## 2015-06-13 DIAGNOSIS — Z7982 Long term (current) use of aspirin: Secondary | ICD-10-CM

## 2015-06-13 DIAGNOSIS — Z79899 Other long term (current) drug therapy: Secondary | ICD-10-CM

## 2015-06-13 DIAGNOSIS — F419 Anxiety disorder, unspecified: Secondary | ICD-10-CM | POA: Diagnosis present

## 2015-06-13 DIAGNOSIS — Z7401 Bed confinement status: Secondary | ICD-10-CM

## 2015-06-13 DIAGNOSIS — N179 Acute kidney failure, unspecified: Secondary | ICD-10-CM | POA: Diagnosis present

## 2015-06-13 DIAGNOSIS — R627 Adult failure to thrive: Secondary | ICD-10-CM | POA: Diagnosis present

## 2015-06-13 DIAGNOSIS — L8990 Pressure ulcer of unspecified site, unspecified stage: Secondary | ICD-10-CM | POA: Insufficient documentation

## 2015-06-13 DIAGNOSIS — Z8673 Personal history of transient ischemic attack (TIA), and cerebral infarction without residual deficits: Secondary | ICD-10-CM

## 2015-06-13 DIAGNOSIS — I482 Chronic atrial fibrillation: Secondary | ICD-10-CM | POA: Diagnosis not present

## 2015-06-13 DIAGNOSIS — I480 Paroxysmal atrial fibrillation: Secondary | ICD-10-CM | POA: Diagnosis not present

## 2015-06-13 DIAGNOSIS — I959 Hypotension, unspecified: Secondary | ICD-10-CM | POA: Diagnosis present

## 2015-06-13 DIAGNOSIS — G9340 Encephalopathy, unspecified: Secondary | ICD-10-CM | POA: Diagnosis present

## 2015-06-13 DIAGNOSIS — Z79891 Long term (current) use of opiate analgesic: Secondary | ICD-10-CM

## 2015-06-13 DIAGNOSIS — I4891 Unspecified atrial fibrillation: Secondary | ICD-10-CM | POA: Diagnosis not present

## 2015-06-13 DIAGNOSIS — Z888 Allergy status to other drugs, medicaments and biological substances status: Secondary | ICD-10-CM

## 2015-06-13 DIAGNOSIS — F0390 Unspecified dementia without behavioral disturbance: Secondary | ICD-10-CM | POA: Diagnosis present

## 2015-06-13 DIAGNOSIS — E870 Hyperosmolality and hypernatremia: Secondary | ICD-10-CM | POA: Diagnosis present

## 2015-06-13 DIAGNOSIS — Z96653 Presence of artificial knee joint, bilateral: Secondary | ICD-10-CM | POA: Diagnosis present

## 2015-06-13 DIAGNOSIS — R40242 Glasgow coma scale score 9-12, unspecified time: Secondary | ICD-10-CM

## 2015-06-13 DIAGNOSIS — E872 Acidosis: Secondary | ICD-10-CM | POA: Diagnosis present

## 2015-06-13 DIAGNOSIS — E860 Dehydration: Secondary | ICD-10-CM | POA: Diagnosis not present

## 2015-06-13 DIAGNOSIS — K5900 Constipation, unspecified: Secondary | ICD-10-CM | POA: Diagnosis present

## 2015-06-13 DIAGNOSIS — D631 Anemia in chronic kidney disease: Secondary | ICD-10-CM | POA: Diagnosis present

## 2015-06-13 DIAGNOSIS — Z515 Encounter for palliative care: Secondary | ICD-10-CM | POA: Insufficient documentation

## 2015-06-13 DIAGNOSIS — Z7952 Long term (current) use of systemic steroids: Secondary | ICD-10-CM

## 2015-06-13 DIAGNOSIS — M549 Dorsalgia, unspecified: Secondary | ICD-10-CM | POA: Diagnosis present

## 2015-06-13 DIAGNOSIS — M1990 Unspecified osteoarthritis, unspecified site: Secondary | ICD-10-CM | POA: Diagnosis present

## 2015-06-13 DIAGNOSIS — R64 Cachexia: Secondary | ICD-10-CM | POA: Diagnosis present

## 2015-06-13 DIAGNOSIS — I129 Hypertensive chronic kidney disease with stage 1 through stage 4 chronic kidney disease, or unspecified chronic kidney disease: Secondary | ICD-10-CM | POA: Diagnosis present

## 2015-06-13 HISTORY — DX: Unspecified atrial fibrillation: I48.91

## 2015-06-13 HISTORY — DX: Diverticulitis of intestine, part unspecified, without perforation or abscess without bleeding: K57.92

## 2015-06-13 LAB — CBC WITH DIFFERENTIAL/PLATELET
Basophils Absolute: 0 10*3/uL (ref 0.0–0.1)
Basophils Relative: 0 %
Eosinophils Absolute: 0.1 10*3/uL (ref 0.0–0.7)
Eosinophils Relative: 1 %
HCT: 27.8 % — ABNORMAL LOW (ref 36.0–46.0)
Hemoglobin: 9 g/dL — ABNORMAL LOW (ref 12.0–15.0)
Lymphocytes Relative: 14 %
Lymphs Abs: 1.8 10*3/uL (ref 0.7–4.0)
MCH: 24.1 pg — ABNORMAL LOW (ref 26.0–34.0)
MCHC: 32.4 g/dL (ref 30.0–36.0)
MCV: 74.3 fL — ABNORMAL LOW (ref 78.0–100.0)
Monocytes Absolute: 0.8 10*3/uL (ref 0.1–1.0)
Monocytes Relative: 6 %
Neutro Abs: 10.4 10*3/uL — ABNORMAL HIGH (ref 1.7–7.7)
Neutrophils Relative %: 79 %
Platelets: DECREASED 10*3/uL (ref 150–400)
RBC: 3.74 MIL/uL — ABNORMAL LOW (ref 3.87–5.11)
RDW: 21.1 % — ABNORMAL HIGH (ref 11.5–15.5)
WBC: 13.1 10*3/uL — ABNORMAL HIGH (ref 4.0–10.5)

## 2015-06-13 LAB — COMPREHENSIVE METABOLIC PANEL
ALT: 10 U/L — ABNORMAL LOW (ref 14–54)
AST: 27 U/L (ref 15–41)
Albumin: 1.9 g/dL — ABNORMAL LOW (ref 3.5–5.0)
Alkaline Phosphatase: 55 U/L (ref 38–126)
Anion gap: 11 (ref 5–15)
BUN: 32 mg/dL — ABNORMAL HIGH (ref 6–20)
CO2: 16 mmol/L — ABNORMAL LOW (ref 22–32)
Calcium: 8.4 mg/dL — ABNORMAL LOW (ref 8.9–10.3)
Chloride: 128 mmol/L — ABNORMAL HIGH (ref 101–111)
Creatinine, Ser: 3.08 mg/dL — ABNORMAL HIGH (ref 0.44–1.00)
GFR calc Af Amer: 14 mL/min — ABNORMAL LOW (ref >60)
GFR calc non Af Amer: 12 mL/min — ABNORMAL LOW (ref >60)
Glucose, Bld: 90 mg/dL (ref 65–99)
Potassium: 4.3 mmol/L (ref 3.5–5.1)
Sodium: 155 mmol/L — ABNORMAL HIGH (ref 135–145)
Total Bilirubin: 1 mg/dL (ref 0.3–1.2)
Total Protein: 6.9 g/dL (ref 6.5–8.1)

## 2015-06-13 LAB — I-STAT CG4 LACTIC ACID, ED: Lactic Acid, Venous: 6 mmol/L (ref 0.5–2.0)

## 2015-06-13 LAB — CBG MONITORING, ED: Glucose-Capillary: 84 mg/dL (ref 65–99)

## 2015-06-13 MED ORDER — AMIODARONE HCL 150 MG/3ML IV SOLN: 150.0000 mg | Freq: Once | INTRAVENOUS | Status: DC

## 2015-06-13 MED ORDER — ONDANSETRON HCL 4 MG PO TABS: 4.0000 mg | ORAL_TABLET | Freq: Four times a day (QID) | ORAL | Status: DC | PRN

## 2015-06-13 MED ORDER — ONDANSETRON HCL 4 MG/2ML IJ SOLN: 4.0000 mg | Freq: Four times a day (QID) | INTRAMUSCULAR | Status: DC | PRN

## 2015-06-13 MED ORDER — LORAZEPAM 2 MG/ML IJ SOLN
0.5000 mg | Freq: Four times a day (QID) | INTRAMUSCULAR | Status: DC | PRN
  Administered 2015-06-14 – 2015-06-15 (×2): 0.5 mg via INTRAVENOUS
  Filled 2015-06-13 (×2): qty 1

## 2015-06-13 MED ORDER — METOPROLOL TARTRATE 1 MG/ML IV SOLN
5.0000 mg | INTRAVENOUS | Status: DC | PRN
  Administered 2015-06-13: 2.5 mg via INTRAVENOUS
  Administered 2015-06-13: 5 mg via INTRAVENOUS
  Filled 2015-06-13 (×3): qty 5

## 2015-06-13 MED ORDER — FENTANYL CITRATE (PF) 100 MCG/2ML IJ SOLN
INTRAMUSCULAR | Status: AC
  Filled 2015-06-13: qty 2

## 2015-06-13 MED ORDER — AMIODARONE IV BOLUS ONLY 150 MG/100ML: 150.0000 mg | Freq: Once | INTRAVENOUS | Status: DC

## 2015-06-13 MED ORDER — DILTIAZEM LOAD VIA INFUSION
10.0000 mg | Freq: Once | INTRAVENOUS | Status: AC
  Administered 2015-06-13: 10 mg via INTRAVENOUS
  Filled 2015-06-13: qty 10

## 2015-06-13 MED ORDER — ACETAMINOPHEN 325 MG PO TABS: 650.0000 mg | ORAL_TABLET | Freq: Four times a day (QID) | ORAL | Status: DC | PRN

## 2015-06-13 MED ORDER — SODIUM CHLORIDE 0.45 % IV SOLN
INTRAVENOUS | Status: DC
  Administered 2015-06-13: 18:00:00 via INTRAVENOUS
  Administered 2015-06-14: 75 mL/h via INTRAVENOUS

## 2015-06-13 MED ORDER — MORPHINE SULFATE (PF) 4 MG/ML IV SOLN
4.0000 mg | Freq: Once | INTRAVENOUS | Status: AC
  Administered 2015-06-13: 4 mg via INTRAVENOUS
  Filled 2015-06-13: qty 1

## 2015-06-13 MED ORDER — DILTIAZEM HCL 100 MG IV SOLR
INTRAVENOUS | Status: AC
  Filled 2015-06-13: qty 100

## 2015-06-13 MED ORDER — FENTANYL CITRATE (PF) 100 MCG/2ML IJ SOLN
25.0000 ug | Freq: Once | INTRAMUSCULAR | Status: AC
  Administered 2015-06-13: 25 ug via INTRAVENOUS

## 2015-06-13 MED ORDER — ACETAMINOPHEN 650 MG RE SUPP
650.0000 mg | Freq: Four times a day (QID) | RECTAL | Status: DC | PRN
  Administered 2015-06-15: 650 mg via RECTAL
  Filled 2015-06-13: qty 1

## 2015-06-13 MED ORDER — MORPHINE SULFATE (PF) 2 MG/ML IV SOLN
1.0000 mg | INTRAVENOUS | Status: DC | PRN
  Administered 2015-06-13 – 2015-06-16 (×16): 1 mg via INTRAVENOUS
  Filled 2015-06-13 (×17): qty 1

## 2015-06-13 MED ORDER — DILTIAZEM HCL 100 MG IV SOLR
5.0000 mg/h | INTRAVENOUS | Status: DC
  Administered 2015-06-13: 5 mg/h via INTRAVENOUS

## 2015-06-13 MED ORDER — SODIUM CHLORIDE 0.9 % IV SOLN
INTRAVENOUS | Status: DC
Start: 2015-06-13 — End: 2015-06-14

## 2015-06-13 MED ORDER — SODIUM CHLORIDE 0.9 % IV BOLUS (SEPSIS)
2000.0000 mL | Freq: Once | INTRAVENOUS | Status: AC
  Administered 2015-06-13: 2000 mL via INTRAVENOUS

## 2015-06-13 NOTE — H&P (Signed)
History and Physical        Hospital Admission Note Date: 06/13/2015  Patient name: Shelly [name] Medical record number: [id number] Date of birth: February 01, 1919 Age: 81 y.o. Gender: female  PCP: FULP, CAMMIE, MD  Referring physician: Dr Carollee Herter Mumma   Chief Complaint:  Acute encephalopathy with atrial ablation with RVR  HPI: Patient is a 81 year old female with advanced dementia, failure to thrive, bedridden, CVA, atrial fibrillation (on aspirin), hypertension, arthritis presented to ED with altered mental status, lethargy and unresponsive episode. Patient lives at home with her family. At the time of the encounter, patient is unable to provide any history. Patient has multiple family members in the room and history was provided by them including daughter, grandson, granddaughter. Per family, this morning after breakfast, patient was noticed to be less responsive, had a near syncopal episode and not responding appropriately to questions. They checked her blood pressure and was unrecordable. EMS was called, patient was found to be tachycardiac with HR 100s, hypotensive 90/60 and an atrial fibrillation with RVR. For last few days, patient has been complaining of pain in her lower back, per daughter has a hairline fracture in the lower back. Patient has been declining significantly over the last 2-3 months. Family has Hoyer lift for the patient at home. In ED, BP initially improved with IV fluids, patient was given IV Cardizem. However subsequently her pressure dropped to 60-70s. She also received morphine and fentanyl 25 mcg 1 for back pain. Cardiology was consulted and patient was seen by Dr. Excell Seltzer who had a lengthy discussion with patient's family and family at this time requested Comfort Care status. Labs reviewed, sodium 155, bicarbonate 16, BUN 32, creatinine 3.08 (creatinine was 0.95 on  06/02/15), albumin 1.9, lactic acid 6.0, WBCs 13.1, hemoglobin 9.0. Chest x-ray with cardiomegaly and central vascular congestion EKG with rate 196, A. fib with RVR   Review of Systems:  Unable to obtain from the patient due to her mental status  Past Medical History: Past Medical History  Diagnosis Date  . A-fib (HCC)   . Diverticulitis   . Hypertension   . Dementia   . Arthritis     Past Surgical History  Procedure Laterality Date  . Joint replacement    . Tubal ligation    . Carpal tunnel release    . Dilation and curettage of uterus    . Cataract extraction w/ intraocular lens  implant, bilateral Bilateral   . Total abdominal hysterectomy w/ bilateral salpingoophorectomy    . Replacement total knee bilateral      Medications: Prior to Admission medications   Medication Sig Start Date End Date Taking? Authorizing Provider  ALPRAZolam (XANAX) 0.25 MG tablet Take 0.125 mg by mouth 2 (two) times daily as needed for anxiety.   Yes Historical Provider, MD  aspirin EC 81 MG tablet Take 1 tablet (81 mg total) by mouth daily. 11/28/13  Yes Evlyn Kanner Love, PA-C  ciprofloxacin (CIPRO) 500 MG/5ML (10%) suspension Take 5 mLs (500 mg total) by mouth 2 (two) times daily. 06/02/15  Yes Alison Murray, MD  docusate sodium (COLACE) 100 MG capsule Take 100 mg by mouth daily as needed  for mild constipation.   Yes Historical Provider, MD  furosemide (LASIX) 20 MG tablet Take 1 tablet (20 mg total) by mouth daily. 02/08/15  Yes Dione Booze, MD  HYDROcodone-acetaminophen (NORCO/VICODIN) 5-325 MG per tablet Take 1-2 tablets by mouth every 8 (eight) hours as needed. Severe pain. 03/04/15  Yes Historical Provider, MD  magnesium oxide (MAG-OX) 400 (241.3 MG) MG tablet Take 1 tablet (400 mg total) by mouth daily. 12/14/13  Yes Christiane Ha, MD  metoprolol tartrate (LOPRESSOR) 25 MG tablet Take 1 tablet (25 mg total) by mouth 2 (two) times daily. 12/14/13  Yes Christiane Ha, MD  mirtazapine  (REMERON) 15 MG tablet Take 15 mg by mouth at bedtime.   Yes Historical Provider, MD  pantoprazole (PROTONIX) 40 MG tablet Take 40 mg by mouth daily.   Yes Historical Provider, MD  predniSONE (DELTASONE) 5 MG tablet Take 5 mg by mouth every morning.    Yes Historical Provider, MD  metroNIDAZOLE (FLAGYL) 50 mg/ml oral suspension Take 10 mLs (500 mg total) by mouth 3 (three) times daily. Patient not taking: Reported on 06/13/2015 06/02/15   Alison Murray, MD    Allergies:   Allergies  Allergen Reactions  . Aricept [Donepezil Hcl] Nausea And Vomiting    She becomes violently ill per daughter.    Social History: Per family, reports that she has never smoked. She does not have any smokeless tobacco history on file. No alcohol or any illicit drugs. Lives at home with her family   Family History: Family History  Problem Relation Age of Onset  . Hypertension Daughter   . High blood pressure    . Cancer Neg Hx   . Thyroid nodules      Physical Exam: Blood pressure 98/81, pulse 83, temperature 98.4 F (36.9 C), temperature source Rectal, resp. rate 25, weight 61.236 kg (135 lb), SpO2 100 %. General: Alert, awake, mumbling, in moderate distress, somewhat shallow breathing HEENT: normocephalic, atraumatic, anicteric sclera, pink conjunctiva, pupils equal and reactive to light and accomodation, oropharynx clear, poor dentition Neck: supple, no masses or lymphadenopathy, no goiter, no bruits  Heart: Irregularly irregular, tachycardia Lungs: Bilateral rails Abdomen: Soft, nontender, nondistended, positive bowel sounds, no masses. Extremities: No clubbing, cyanosis or edema with positive pedal pulses. Neuro: unable to obtain, does not follow commands Psych: alert, does not follow commands Skin: no rashes or lesions, warm and dry   LABS on Admission:  Basic Metabolic Panel:  Recent Labs Lab 06/13/15 1450  NA 155*  K 4.3  CL 128*  CO2 16*  GLUCOSE 90  BUN 32*  CREATININE 3.08*    CALCIUM 8.4*   Liver Function Tests:  Recent Labs Lab 06/13/15 1450  AST 27  ALT 10*  ALKPHOS 55  BILITOT 1.0  PROT 6.9  ALBUMIN 1.9*   No results for input(s): LIPASE, AMYLASE in the last 168 hours. No results for input(s): AMMONIA in the last 168 hours. CBC:  Recent Labs Lab 06/13/15 1450  WBC 13.1*  NEUTROABS 10.4*  HGB 9.0*  HCT 27.8*  MCV 74.3*  PLT PLATELET CLUMPS NOTED ON SMEAR, COUNT APPEARS DECREASED   Cardiac Enzymes: No results for input(s): CKTOTAL, CKMB, CKMBINDEX, TROPONINI in the last 168 hours. BNP: Invalid input(s): POCBNP CBG:  Recent Labs Lab 06/13/15 1422  GLUCAP 84    Radiological Exams on Admission:  Ct Abdomen Pelvis Wo Contrast  05/27/2015  CLINICAL DATA:  Subacute onset of worsening confusion, and leaning toward the right side. Nausea and  vomiting. Diffuse right-sided abdominal pain. Initial encounter. EXAM: CT ABDOMEN AND PELVIS WITHOUT CONTRAST TECHNIQUE: Multidetector CT imaging of the abdomen and pelvis was performed following the standard protocol without IV contrast. COMPARISON:  CT of the abdomen and pelvis performed 03/25/2015 FINDINGS: Bibasilar opacities may reflect chronic scarring, as they are similar in appearance to the prior study. A tiny hiatal hernia is noted. The liver and spleen are unremarkable in appearance. The gallbladder is within normal limits. The pancreas and adrenal glands are unremarkable. A 1.8 cm cyst is noted at the interpole region of the right kidney. The kidneys are otherwise unremarkable. There is no evidence of hydronephrosis. No renal or ureteral stones are seen. No perinephric stranding is appreciated. The small bowel is unremarkable in appearance. The stomach is otherwise within normal limits. No acute vascular abnormalities are seen. Scattered calcification is noted along the abdominal aorta and its branches. The appendix is normal in caliber and contains air, without evidence for appendicitis. Focal wall  thickening is noted along the proximal sigmoid colon, with surrounding soft tissue inflammation and trace free fluid, compatible with acute diverticulitis. Scattered diverticulosis is noted along the sigmoid colon. Dense stool is noted filling the rectum, measuring up to 8.1 cm in transverse dimension, concerning for fecal impaction. The bladder is moderately distended and grossly unremarkable. A calcified uterine fibroid is noted. The uterus is otherwise unremarkable. The ovaries are relatively symmetric. No suspicious adnexal masses are seen. No inguinal lymphadenopathy is seen. No acute osseous abnormalities are identified. Compression deformities are noted at L1 and L2, new at L2 since the prior study. IMPRESSION: 1. Acute diverticulitis at the proximal sigmoid colon, with focal wall thickening, surrounding soft tissue inflammation and trace free fluid. No evidence of perforation or abscess formation. Follow-up colonoscopy could be considered after completion of treatment, to exclude an underlying mass, as deemed clinically appropriate. 2. Dense stool noted filling the rectum, measuring up to 8.1 cm in transverse dimension, concerning for fecal impaction. 3. Scattered diverticulosis along the sigmoid colon. 4. Scattered calcification along the abdominal aorta and its branches. 5. Chronic scarring again noted at the lung bases. 6. Calcified uterine fibroid seen. 7. Tiny hiatal hernia seen. 8. New compression deformity at L2, and chronic compression deformity at L1. Electronically Signed   By: Roanna Raider M.D.   On: 05/27/2015 23:11   Dg Chest Portable 1 View  06/13/2015  CLINICAL DATA:  81 year old female with altered mental status. Initial encounter. EXAM: PORTABLE CHEST 1 VIEW COMPARISON:  05/27/2015. FINDINGS: Cardiomegaly.  Tortuous aorta. Central pulmonary vascular prominence superimposed upon chronic lung changes without segmental consolidation or pneumothorax. IMPRESSION: Central pulmonary vascular  prominence superimposed upon chronic lung changes. Cardiomegaly. Tortuous aorta. Electronically Signed   By: Lacy Duverney M.D.   On: 06/13/2015 15:33   Dg Chest Port 1 View  05/27/2015  CLINICAL DATA:  Vomiting and weakness. EXAM: PORTABLE CHEST 1 VIEW COMPARISON:  03/1915 FINDINGS: Cardiac enlargement without definite vascular congestion. Diffuse bronchial wall thickening with central interstitial fibrosis likely due to chronic bronchitis. Mild bronchiectasis in the lower lobes. Superimposed opacity on the left lung base suggesting superimposed consolidation. This may be due to pneumonia or atelectasis. No pneumothorax. No blunting of costophrenic angles. IMPRESSION: Chronic fibrosis, bronchiectasis, and chronic bronchitic changes in the lungs. Superimposed opacity in the left lung base may represent pneumonia or atelectasis. Electronically Signed   By: Burman Nieves M.D.   On: 05/27/2015 22:02    *I have personally reviewed the images above*  EKG: Independently reviewed. rate 196, atrial fibrillation with RVR   Assessment/Plan Principal Problem:   Atrial fibrillation with RVR (HCC): Known history of atrial ablation however was not on antique variations country to advanced dementia and fall risk. Patient also has multiple other comorbidities including hypotension, leukocytosis, severe hypernatremia with acute renal insufficiency and lactic acidosis - Still with rapid A. fib and hypotension, unable to give any rate controlling medications. Patient was given 1 dose of IV amiodarone. Patient was seen by cardiology, Dr. Excell Seltzer in ED who had lengthy discussion with the patient's family and at this point family has requested focus on her comfort. I discussed the CODE STATUS again with the family and they confirmed DNR status with comfort care goals. - Placed on morphine IV and Ativan IV as needed for pain and anxiety  Active Problems:   Dementia with failure to thrive - Continue comfort care  goals    Acute renal failure superimposed on stage 4 chronic kidney disease (HCC), severe hypernatremia with lactic acidosis, hypotension - Patient on gentle hydration with half normal saline  DVT prophylaxis: SCDs  CODE STATUS: DO NOT RESUSCITATE, DO NOT INTUBATE, comfort care  Family Communication: Admission, patients condition and plan of care including tests being ordered have been discussed with the patient's multiple family members who indicates understanding and agree with the plan and Code Status   Time Spent on Admission:   Merrie Epler M.D. [facility] 06/13/2015, 6:00 PM Pager: [phone]  If 7PM-7AM, please contact night-coverage [email or web address] Password [email or web address]

## 2015-06-13 NOTE — ED Notes (Signed)
EMS called for Altered mental status from home. Pt has hx of stroke 3 years ago with left sided deficits. LSN 1930 last night. Pt's baseline is dementia- can say few words. Pt lethargic today. CBG 94, initial BP 80's systolic, HR 180s. EMS reports HR has slowed now low 100's Afib, BP improved to 92/40 . Pt has DNR. Pt 100% on nonrebreather

## 2015-06-13 NOTE — ED Provider Notes (Signed)
CSN: [id number]     Arrival date & time 06/13/15  1413 History   First MD Initiated Contact with Patient 06/13/15 1413     Chief Complaint  Patient presents with  . Altered Mental Status  . Tachycardia     (Consider location/radiation/quality/duration/timing/severity/associated sxs/prior Treatment) HPI  Patient is a 81 year old female with past medical history significant for CVA, dementia, A. fib (ASA), hypertension, arthritis, who presents to the emergency department altered mental status and lethargy. Patient lives at home with her family. They noted that earlier today she was just not quite acting herself. Stated that they set her up and she had a near-syncope episode. Patient became less responsive. Not responding appropriately to questions. Denies the first, chills, cough, nausea, vomiting, diarrhea. When EMS arrived, patient found to have tachycardic in the 200s, hypotensive 90/60s, found to be in a fib. Patient complaining of pain all over, specifically pain in her back.  Past Medical History  Diagnosis Date  . A-fib (HCC)   . Diverticulitis   . Hypertension   . Dementia   . Arthritis    Past Surgical History  Procedure Laterality Date  . Joint replacement    . Tubal ligation    . Carpal tunnel release    . Dilation and curettage of uterus    . Cataract extraction w/ intraocular lens  implant, bilateral Bilateral   . Total abdominal hysterectomy w/ bilateral salpingoophorectomy    . Replacement total knee bilateral     Family History  Problem Relation Age of Onset  . Hypertension Daughter   . High blood pressure    . Cancer Neg Hx   . Thyroid nodules     Social History  Substance Use Topics  . Smoking status: Never Smoker   . Smokeless tobacco: None  . Alcohol Use: No   OB History    Gravida Para Term Preterm AB TAB SAB Ectopic Multiple Living   0 0 0 0 0 0 0 0       Review of Systems  Unable to perform ROS: Mental status change      Allergies   Aricept  Home Medications   Prior to Admission medications   Medication Sig Start Date End Date Taking? Authorizing Provider  ALPRAZolam (XANAX) 0.25 MG tablet Take 0.125 mg by mouth 2 (two) times daily as needed for anxiety.   Yes Historical Provider, MD  aspirin EC 81 MG tablet Take 1 tablet (81 mg total) by mouth daily. 11/28/13  Yes Evlyn Kanner Love, PA-C  ciprofloxacin (CIPRO) 500 MG/5ML (10%) suspension Take 5 mLs (500 mg total) by mouth 2 (two) times daily. 06/02/15  Yes Alison Murray, MD  docusate sodium (COLACE) 100 MG capsule Take 100 mg by mouth daily as needed for mild constipation.   Yes Historical Provider, MD  furosemide (LASIX) 20 MG tablet Take 1 tablet (20 mg total) by mouth daily. 02/08/15  Yes Dione Booze, MD  HYDROcodone-acetaminophen (NORCO/VICODIN) 5-325 MG per tablet Take 1-2 tablets by mouth every 8 (eight) hours as needed. Severe pain. 03/04/15  Yes Historical Provider, MD  magnesium oxide (MAG-OX) 400 (241.3 MG) MG tablet Take 1 tablet (400 mg total) by mouth daily. 12/14/13  Yes Christiane Ha, MD  metoprolol tartrate (LOPRESSOR) 25 MG tablet Take 1 tablet (25 mg total) by mouth 2 (two) times daily. 12/14/13  Yes Christiane Ha, MD  mirtazapine (REMERON) 15 MG tablet Take 15 mg by mouth at bedtime.   Yes Historical Provider, MD  pantoprazole (PROTONIX) 40 MG tablet Take 40 mg by mouth daily.   Yes Historical Provider, MD  predniSONE (DELTASONE) 5 MG tablet Take 5 mg by mouth every morning.    Yes Historical Provider, MD  metroNIDAZOLE (FLAGYL) 50 mg/ml oral suspension Take 10 mLs (500 mg total) by mouth 3 (three) times daily. Patient not taking: Reported on 06/13/2015 06/02/15   Alison Murray, MD   BP 101/48 mmHg  Pulse 62  Temp(Src) 97.9 F (36.6 C) (Oral)  Resp 20  Wt 61.236 kg  SpO2 98% Physical Exam  Constitutional: She appears cachectic. She is uncooperative. She appears ill. She appears distressed.  HENT:  Head: Normocephalic and atraumatic.   Mouth/Throat: Oropharynx is clear and moist.  Eyes: Conjunctivae and EOM are normal. Pupils are equal, round, and reactive to light.  Neck: Normal range of motion. No JVD present. No tracheal deviation present.  Cardiovascular: Intact distal pulses.  An irregularly irregular rhythm present.  No murmur heard. Pulmonary/Chest: Effort normal and breath sounds normal. No respiratory distress. She has no wheezes.  Abdominal: Soft. There is no tenderness. There is no rebound and no guarding.  Musculoskeletal: Normal range of motion.  Neurological: She is alert. She is disoriented. No cranial nerve deficit. GCS eye subscore is 4. GCS verbal subscore is 4. GCS motor subscore is 6.  Decreased strength of LUE and LLE (baseline 2/2 CVA), 5/5 RUE and RLE  Skin: Skin is warm. No pallor.  Psychiatric: She has a normal mood and affect.  Nursing note and vitals reviewed.   ED Course  Procedures (including critical care time) Labs Review Labs Reviewed  CBC WITH DIFFERENTIAL/PLATELET - Abnormal; Notable for the following:    WBC 13.1 (*)    RBC 3.74 (*)    Hemoglobin 9.0 (*)    HCT 27.8 (*)    MCV 74.3 (*)    MCH 24.1 (*)    RDW 21.1 (*)    Neutro Abs 10.4 (*)    All other components within normal limits  COMPREHENSIVE METABOLIC PANEL - Abnormal; Notable for the following:    Sodium 155 (*)    Chloride 128 (*)    CO2 16 (*)    BUN 32 (*)    Creatinine, Ser 3.08 (*)    Calcium 8.4 (*)    Albumin 1.9 (*)    ALT 10 (*)    GFR calc non Af Amer 12 (*)    GFR calc Af Amer 14 (*)    All other components within normal limits  I-STAT CG4 LACTIC ACID, ED - Abnormal; Notable for the following:    Lactic Acid, Venous 6.00 (*)    All other components within normal limits  CULTURE, BLOOD (ROUTINE X 2)  CULTURE, BLOOD (ROUTINE X 2)  I-STAT TROPOININ, ED  CBG MONITORING, ED    Imaging Review Dg Chest Portable 1 View  06/13/2015  CLINICAL DATA:  81 year old female with altered mental status.  Initial encounter. EXAM: PORTABLE CHEST 1 VIEW COMPARISON:  05/27/2015. FINDINGS: Cardiomegaly.  Tortuous aorta. Central pulmonary vascular prominence superimposed upon chronic lung changes without segmental consolidation or pneumothorax. IMPRESSION: Central pulmonary vascular prominence superimposed upon chronic lung changes. Cardiomegaly. Tortuous aorta. Electronically Signed   By: Lacy Duverney M.D.   On: 06/13/2015 15:33   I have personally reviewed and evaluated these images and lab results as part of my medical decision-making.   EKG Interpretation   Date/Time:  Thursday June 13 2015 14:22:21 EST Ventricular Rate:  196 PR  Interval:    QRS Duration: 96 QT Interval:  256 QTC Calculation: 462 R Axis:   59 Text Interpretation:  Atrial fibrillation with rapid V-rate Low voltage,  extremity leads Repolarization abnormality, prob rate related Baseline  wander in lead(s) V6 Atrial fibrillation with RVR Confirmed by NGUYEN,  EMILY ([id number]) on 06/13/2015 3:42:47 PM      MDM   Final diagnoses:  None   Patient is a 81 year old female with past medical history significant for CVA, dementia, A. fib (ASA), hypertension, arthritis, who presents to the emergency department altered mental status and lethargy. On arrival patient alert, not oriented. Not able to follow commands or respond to questions appropriately. When EMS arrived patient was found to be in a fib with RVR, rate 190s, SBP 90s. Patient not on anticoagulation for afib given fall risk.   Patient has not received her medications this morning. Patient family at bedside, discussed with them that patient required cardioversion for her unstable A. fib with RVR and the medications were likely to make patient more hypotensive. They did not wish to pursue cardioversion or invasive measures. They did not want any central lines, a line, or pressors.   Patient started on IVF bolus.  Cardiology consulted upon arrival of the patient.  Did not  initially give amiodarone given that the patient is not anticoagulated with recent CVA. Given Lopressor 5 mg x 2, discussed with family that this can cause pressures to drop further.  They would like to attempt the medication. Patient remained with SBP 90s.  Patient continued to complain of pain in her back, they would like pain medication. Discussed risk of hypotension, they would like to treat her pain. Given Fentanyl.   Started on Cardizem gtt; however, BP dropped to MAP 50s. Cardizem gtt discontinued. Cardiology PA at bedside, recommended amiodarone 150 mg bolus. No improvement of HR or BP.   Cardiologist, Dr. Excell Seltzer, evaluated the patient at bedside. Had a long discussion with the patient's family, 2 daughters and granddaughters, they wish to make the patient comfort care. Will admit the patient to Dr. Isidoro Donning, patient will be admitted to Palliative bed.     Corena Herter, MD 06/13/15 6720  Leta Baptist, MD 06/15/15 2040

## 2015-06-13 NOTE — ED Notes (Signed)
Cardizem stopped due to hypotension. EDP and cardiology provider at the bedside. Pt Bp increased to 82/58 after stopping medication. Primary RN aware of changes.

## 2015-06-13 NOTE — Consult Note (Signed)
CARDIOLOGY CONSULT NOTE   Patient ID: Shelly [name] MRN: [id number] DOB/AGE: 1918-09-17 81 y.o.  Admit date: 06/13/2015  Primary Physician   Cain Saupe, MD Primary Cardiologist   Dr Eldridge Dace Reason for Consultation   Atrial fib and hypotension  Shelly [name] is a 81 y.o. year old female with a history of CVA, dementia, ICH, afib (ASA only 2nd fall risk), RA, HTN. Lives at home with family support.   D/C 11/27 after admission for diverticulitis, also prerenal. Hx dehydration, PO intake is poor, per family. She will drink a little, then spit liquids out. They do what they can.  HHRN saw her 12/06 and checked her HR/BP. No reports by family of any acute problems.  Today, pt seemed as usual, but when they sat her up (2 person assist), she became unresponsive. Until then, she had been much as usual, answering questions with 1 word, no acute complaints. After she came to, she was lethargic. EMS tx to ER. Pt in afib, rate 100s, SBP 80s.   BP initially improved with fluid, but pt HR becane elevated and SBP 70s. She was given fentanyl 25 mcg IV x 1 for discomfort (back) and IV Cardizem 10 mg. She also got MSO4 4 mg. Her BP dropped into the 60s and Cardizem gtt not started. She got IVF and her BP has improved into the 90s. She is more alert with the better blood pressure. Seems much more comfortable than on initial evaluation. She still has some back discomfort, but is resting well.  Pt does not have a POA, but 2 daughters are present and in agreement with plan for treatment as long as not too invasive, and keeping her comfortable. They are clear about the DNR.  Past Medical History  Diagnosis Date  . A-fib (HCC)   . Diverticulitis   . Hypertension   . Dementia   . Arthritis      Past Surgical History  Procedure Laterality Date  . Joint replacement    . Tubal ligation    . Carpal tunnel release    . Dilation and curettage of uterus    . Cataract extraction w/ intraocular  lens  implant, bilateral Bilateral   . Total abdominal hysterectomy w/ bilateral salpingoophorectomy    . Replacement total knee bilateral      Allergies  Allergen Reactions  . Aricept [Donepezil Hcl] Nausea And Vomiting    She becomes violently ill per daughter.    I have reviewed the patient's current medications   . amiodarone    . diltiazem (CARDIZEM) infusion Stopped (06/13/15 1630)   metoprolol  Prior to Admission medications   Medication Sig Start Date End Date Taking? Authorizing Provider  ALPRAZolam (XANAX) 0.25 MG tablet Take 0.125 mg by mouth 2 (two) times daily as needed for anxiety.   Yes Historical Provider, MD  aspirin EC 81 MG tablet Take 1 tablet (81 mg total) by mouth daily. 11/28/13  Yes Evlyn Kanner Love, PA-C  ciprofloxacin (CIPRO) 500 MG/5ML (10%) suspension Take 5 mLs (500 mg total) by mouth 2 (two) times daily. 06/02/15  Yes Alison Murray, MD  docusate sodium (COLACE) 100 MG capsule Take 100 mg by mouth daily as needed for mild constipation.   Yes Historical Provider, MD  furosemide (LASIX) 20 MG tablet Take 1 tablet (20 mg total) by mouth daily. 02/08/15  Yes Dione Booze, MD  HYDROcodone-acetaminophen (NORCO/VICODIN) 5-325 MG per tablet Take 1-2 tablets by mouth every 8 (eight)  hours as needed. Severe pain. 03/04/15  Yes Historical Provider, MD  magnesium oxide (MAG-OX) 400 (241.3 MG) MG tablet Take 1 tablet (400 mg total) by mouth daily. 12/14/13  Yes Christiane Ha, MD  metoprolol tartrate (LOPRESSOR) 25 MG tablet Take 1 tablet (25 mg total) by mouth 2 (two) times daily. 12/14/13  Yes Christiane Ha, MD  mirtazapine (REMERON) 15 MG tablet Take 15 mg by mouth at bedtime.   Yes Historical Provider, MD  pantoprazole (PROTONIX) 40 MG tablet Take 40 mg by mouth daily.   Yes Historical Provider, MD  predniSONE (DELTASONE) 5 MG tablet Take 5 mg by mouth every morning.    Yes Historical Provider, MD  metroNIDAZOLE (FLAGYL) 50 mg/ml oral suspension Take 10 mLs (500 mg  total) by mouth 3 (three) times daily. Patient not taking: Reported on 06/13/2015 06/02/15   Alison Murray, MD     Social History   Social History  . Marital Status: Single    Spouse Name: Tasia Catchings  . Number of Children: 4  . Years of Education: 12+   Occupational History  .     Social History Main Topics  . Smoking status: Never Smoker   . Smokeless tobacco: Not on file  . Alcohol Use: No  . Drug Use: No  . Sexual Activity: No   Other Topics Concern  . Not on file   Social History Narrative   ** Merged History Encounter **       Patient lives at home with her husband Tasia Catchings).   Patient has four children.   Patient has a high school and trade school education.   Patient is retired.   Patient does drink caffeine- one cup of coffee daily.   Patient is right-handed.    Family Status  Relation Status Death Age  . Mother Deceased   . Father Deceased    Family History  Problem Relation Age of Onset  . Hypertension Daughter   . High blood pressure    . Cancer Neg Hx   . Thyroid nodules       ROS:  Full 14 point review of systems complete and found to be negative unless listed above.  Physical Exam: Blood pressure 85/61, pulse 83, temperature 98.4 F (36.9 C), temperature source Rectal, resp. rate 22, weight 135 lb (61.236 kg), SpO2 100 %.  General: Well developed, well nourished, female in moderate distress Head: Eyes PERRLA, No xanthomas.   Normocephalic and atraumatic, oropharynx without edema or exudate. Dentition: poor Lungs: bilateral rales Heart: Heart irregular rate and rhythm with S1, S2, soft murmur. pulses are 2+ upper extrem, slightly decreased in lower extrem   Neck: No carotid bruits. No lymphadenopathy.  JVD elevated but pt not able to sit up. Abdomen: Bowel sounds present, abdomen soft and non-tender without masses or hernias noted. Msk:  No spine or cva tenderness. No weakness, no joint deformities or effusions. Extremities: No clubbing or cyanosis.  no edema.  Neuro: Awakens to verbal and oriented X 1. Chronic focal deficits noted. Skin: No rashes or lesions noted.  Labs:   Lab Results  Component Value Date   WBC 13.1* 06/13/2015   HGB 9.0* 06/13/2015   HCT 27.8* 06/13/2015   MCV 74.3* 06/13/2015   PLT  06/13/2015    PLATELET CLUMPS NOTED ON SMEAR, COUNT APPEARS DECREASED    Recent Labs Lab 06/13/15 1450  NA 155*  K 4.3  CL 128*  CO2 16*  BUN 32*  CREATININE 3.08*  CALCIUM 8.4*  PROT 6.9  BILITOT 1.0  ALKPHOS 55  ALT 10*  AST 27  GLUCOSE 90  ALBUMIN 1.9*   MAGNESIUM  Date Value Ref Range Status  05/28/2015 2.1 1.7 - 2.4 mg/dL Final   Echo: 05/02/2535 - Left ventricle: The cavity size was normal. Wall thickness was increased increased in a pattern of mild to moderate LVH. Systolic function was vigorous. The estimated ejection fraction was in the range of 65% to 70%. Wall motion was normal; there were no regional wall motion abnormalities. Doppler parameters are consistent with abnormal left ventricular relaxation (grade 1 diastolic dysfunction). - Mitral valve: Mildly calcified annulus. Mildly thickened, mildly calcified leaflets . - Atrial septum: There was an atrial septal aneurysm. - Pericardium, extracardiac: A small pericardial effusion was identified circumferential to the heart.  ECG: 06/13/2015 Atrial fib, RVR HR 196    Radiology:  Dg Chest Portable 1 View 06/13/2015  CLINICAL DATA:  81 year old female with altered mental status. Initial encounter. EXAM: PORTABLE CHEST 1 VIEW COMPARISON:  05/27/2015. FINDINGS: Cardiomegaly.  Tortuous aorta. Central pulmonary vascular prominence superimposed upon chronic lung changes without segmental consolidation or pneumothorax. IMPRESSION: Central pulmonary vascular prominence superimposed upon chronic lung changes. Cardiomegaly. Tortuous aorta. Electronically Signed   By: Lacy Duverney M.D.   On: 06/13/2015 15:33    ASSESSMENT AND PLAN:    The patient was seen today by Dr Excell Seltzer, the patient evaluated and the data reviewed.  Principal Problem:   Hypotension - per IM - suspect multifactorial with a component of dehydration from poor intake, rapid afib, ?sepsis  Active Problems:   Paroxysmal atrial fibrillation with rapid ventricular response (HCC) - BP too low to use Cardizem, BB right now -  Will be harder to manage 2nd underlying medical issues - can try amio for rate control - cannot be aggressive about getting her back in SR as she cannot be anticoagulated - could try IVF for support and use BB or Cardizem, but BP may not support this. - options are very limited  Otherwise, per IM   Dementia   Acute renal failure superimposed on stage 4 chronic kidney disease (HCC)   Anemia of chronic kidney failure   Hypernatremia   DNR  Signed: Theodore Demark, PA-C 06/13/2015 5:05 PM Beeper [phone]  Co-Sign MD  Patient seen, examined. Available data reviewed. Agree with findings, assessment, and plan as outlined by Theodore Demark, PA-C. The patient is independently interviewed and evaluated. She is unable to provide any history because of advanced dementia and critical illness. Her lung fields are course with very poor air movement. The patient is awake but not following commands. Heart is irregularly irregular and tachycardic without murmur or gallop. There is no peripheral edema. The abdomen is nontender to palpation.  This is a 81 year old woman with dementia and progressive decline. She has very poor oral intake. I have reviewed her history and she does have paroxysmal atrial fibrillation. During her recent hospitalization she had rapid atrial fibrillation. All laboratory data is reviewed and the patient's lactate is 6. She has acute renal failure with a creatinine of 3. With multiorgan failure, advanced age, and dementia, I had a frank discussion with the patient's family about her grim prognosis. The patient is DO NOT  RESUSCITATE, and after review of treatment options all of the family is in agreement that comfort care is appropriate. The patient should not be placed on telemetry. Would avoid lab draws and any aggressive medical interventions. Focus will be on her  comfort with morphine and anxiolytics as needed. I would not proceed with any cardiac interventions. With her hypotension, I do not think IV therapies for rate control of her atrial fibrillation should be administered. This is discussed with multiple family members and the ED physician.  Tonny Bollman, M.D. 06/13/2015 5:37 PM

## 2015-06-13 NOTE — ED Notes (Signed)
Pt's family at bedside, MD speaking with family.

## 2015-06-13 NOTE — ED Notes (Signed)
Cariology at bedside  

## 2015-06-13 NOTE — ED Notes (Signed)
Applied zoll pads.

## 2015-06-13 NOTE — Progress Notes (Signed)
Met with family and pt. And offered prayer and support.

## 2015-06-14 DIAGNOSIS — Z7189 Other specified counseling: Secondary | ICD-10-CM | POA: Diagnosis not present

## 2015-06-14 DIAGNOSIS — N184 Chronic kidney disease, stage 4 (severe): Secondary | ICD-10-CM | POA: Diagnosis present

## 2015-06-14 DIAGNOSIS — K5900 Constipation, unspecified: Secondary | ICD-10-CM | POA: Diagnosis present

## 2015-06-14 DIAGNOSIS — I482 Chronic atrial fibrillation: Secondary | ICD-10-CM | POA: Diagnosis not present

## 2015-06-14 DIAGNOSIS — E860 Dehydration: Secondary | ICD-10-CM | POA: Diagnosis not present

## 2015-06-14 DIAGNOSIS — A419 Sepsis, unspecified organism: Secondary | ICD-10-CM | POA: Diagnosis present

## 2015-06-14 DIAGNOSIS — R627 Adult failure to thrive: Secondary | ICD-10-CM | POA: Diagnosis present

## 2015-06-14 DIAGNOSIS — N179 Acute kidney failure, unspecified: Secondary | ICD-10-CM | POA: Diagnosis present

## 2015-06-14 DIAGNOSIS — Z888 Allergy status to other drugs, medicaments and biological substances status: Secondary | ICD-10-CM | POA: Diagnosis not present

## 2015-06-14 DIAGNOSIS — D631 Anemia in chronic kidney disease: Secondary | ICD-10-CM | POA: Diagnosis present

## 2015-06-14 DIAGNOSIS — M549 Dorsalgia, unspecified: Secondary | ICD-10-CM | POA: Diagnosis present

## 2015-06-14 DIAGNOSIS — Z7982 Long term (current) use of aspirin: Secondary | ICD-10-CM | POA: Diagnosis not present

## 2015-06-14 DIAGNOSIS — Z515 Encounter for palliative care: Secondary | ICD-10-CM | POA: Diagnosis present

## 2015-06-14 DIAGNOSIS — Z96653 Presence of artificial knee joint, bilateral: Secondary | ICD-10-CM | POA: Diagnosis present

## 2015-06-14 DIAGNOSIS — M1990 Unspecified osteoarthritis, unspecified site: Secondary | ICD-10-CM | POA: Diagnosis present

## 2015-06-14 DIAGNOSIS — E870 Hyperosmolality and hypernatremia: Secondary | ICD-10-CM | POA: Diagnosis present

## 2015-06-14 DIAGNOSIS — I129 Hypertensive chronic kidney disease with stage 1 through stage 4 chronic kidney disease, or unspecified chronic kidney disease: Secondary | ICD-10-CM | POA: Diagnosis present

## 2015-06-14 DIAGNOSIS — Z7952 Long term (current) use of systemic steroids: Secondary | ICD-10-CM | POA: Diagnosis not present

## 2015-06-14 DIAGNOSIS — Z79899 Other long term (current) drug therapy: Secondary | ICD-10-CM | POA: Diagnosis not present

## 2015-06-14 DIAGNOSIS — Z79891 Long term (current) use of opiate analgesic: Secondary | ICD-10-CM | POA: Diagnosis not present

## 2015-06-14 DIAGNOSIS — Z8673 Personal history of transient ischemic attack (TIA), and cerebral infarction without residual deficits: Secondary | ICD-10-CM | POA: Diagnosis not present

## 2015-06-14 DIAGNOSIS — R64 Cachexia: Secondary | ICD-10-CM | POA: Diagnosis present

## 2015-06-14 DIAGNOSIS — F419 Anxiety disorder, unspecified: Secondary | ICD-10-CM | POA: Diagnosis present

## 2015-06-14 DIAGNOSIS — Z66 Do not resuscitate: Secondary | ICD-10-CM | POA: Diagnosis present

## 2015-06-14 DIAGNOSIS — I480 Paroxysmal atrial fibrillation: Secondary | ICD-10-CM | POA: Diagnosis present

## 2015-06-14 DIAGNOSIS — F0390 Unspecified dementia without behavioral disturbance: Secondary | ICD-10-CM | POA: Diagnosis not present

## 2015-06-14 DIAGNOSIS — I4891 Unspecified atrial fibrillation: Secondary | ICD-10-CM | POA: Diagnosis present

## 2015-06-14 DIAGNOSIS — G9340 Encephalopathy, unspecified: Secondary | ICD-10-CM | POA: Diagnosis present

## 2015-06-14 DIAGNOSIS — Z7401 Bed confinement status: Secondary | ICD-10-CM | POA: Diagnosis not present

## 2015-06-14 DIAGNOSIS — E872 Acidosis: Secondary | ICD-10-CM | POA: Diagnosis present

## 2015-06-14 LAB — POCT I-STAT TROPONIN I: Troponin i, poc: 0.01 ng/mL (ref 0.00–0.08)

## 2015-06-14 MED ORDER — GLYCOPYRROLATE 0.2 MG/ML IJ SOLN
0.2000 mg | INTRAMUSCULAR | Status: DC | PRN
  Filled 2015-06-14: qty 1

## 2015-06-14 MED ORDER — GLYCOPYRROLATE 1 MG PO TABS
1.0000 mg | ORAL_TABLET | ORAL | Status: DC | PRN
  Filled 2015-06-14: qty 1

## 2015-06-14 MED ORDER — BISACODYL 10 MG RE SUPP: 10.0000 mg | Freq: Every day | RECTAL | Status: DC | PRN

## 2015-06-14 MED ORDER — LORAZEPAM 2 MG/ML PO CONC: 1.0000 mg | ORAL | Status: DC | PRN

## 2015-06-14 MED ORDER — MORPHINE SULFATE (CONCENTRATE) 10 MG/0.5ML PO SOLN: 5.0000 mg | ORAL | Status: DC | PRN

## 2015-06-14 MED ORDER — HALOPERIDOL LACTATE 2 MG/ML PO CONC
0.5000 mg | ORAL | Status: DC | PRN
  Filled 2015-06-14: qty 0.3

## 2015-06-14 MED ORDER — HALOPERIDOL LACTATE 5 MG/ML IJ SOLN
0.5000 mg | INTRAMUSCULAR | Status: DC | PRN
  Administered 2015-06-14 – 2015-06-15 (×3): 0.5 mg via INTRAVENOUS
  Filled 2015-06-14 (×3): qty 1

## 2015-06-14 MED ORDER — MORPHINE SULFATE (CONCENTRATE) 10 MG /0.5 ML PO SOLN: 5.0000 mg | ORAL | Status: DC | PRN

## 2015-06-14 MED ORDER — HALOPERIDOL 0.5 MG PO TABS
0.5000 mg | ORAL_TABLET | ORAL | Status: DC | PRN
  Filled 2015-06-14: qty 1

## 2015-06-14 NOTE — Progress Notes (Signed)
Nutrition Brief Note  Pt identify by a low braden score. Chart reviewed. Pt is comfort care. No nutrition interventions warranted at this time. Please re-consult as needed.   Roslyn Smiling, MS, RD, LDN Pager # [phone] After hours/ weekend pager # [phone]

## 2015-06-14 NOTE — Progress Notes (Signed)
Lab called critical value of Gram + Cocci in anerobic blood culture drawn yesterday.  Results texted to Dr. Isidoro Donning.  Prompt response from MD, no orders at this time.  Pt has been consulted by Dr. Isidoro Donning to palliative care.

## 2015-06-14 NOTE — Progress Notes (Signed)
[facility]                                                                              Patient Demographics  Shelly [name], is a 81 y.o. female, DOB - 07-Aug-1918, NOI:[id number]  Admit date - 06/13/2015   Admitting Physician Ripudeep Jenna Luo, MD  Outpatient Primary MD for the patient is FULP, CAMMIE, MD  LOS - 1   Chief Complaint  Patient presents with  . Altered Mental Status  . Tachycardia       Brief HPI   Patient is a 81 year old female with advanced dementia, failure to thrive, bedridden, CVA, atrial fibrillation (on aspirin), hypertension, arthritis presented to ED with altered mental status, lethargy and unresponsive episode. Patient lives at home with her family. At the time of the encounter, patient is unable to provide any history. Patient has multiple family members in the room and history was provided by them including daughter, grandson, granddaughter. Per family, this morning after breakfast, patient was noticed to be less responsive, had a near syncopal episode and not responding appropriately to questions. They checked her blood pressure and was unrecordable. EMS was called, patient was found to be tachycardiac with HR 100s, hypotensive 90/60 and an atrial fibrillation with RVR. For last few days, patient has been complaining of pain in her lower back, per daughter has a hairline fracture in the lower back. Patient has been declining significantly over the last 2-3 months. Family has Hoyer lift for the patient at home. In ED, BP initially improved with IV fluids, patient was given IV Cardizem. However subsequently her pressure dropped to 60-70s. She also received morphine and fentanyl 25 mcg 1 for back pain. Cardiology was consulted and patient was seen by Dr. Excell Seltzer who had a lengthy discussion with patient's family and family at this time requested Comfort Care status. Labs reviewed, sodium 155, bicarbonate 16, BUN 32, creatinine 3.08 (creatinine was 0.95 on  06/02/15), albumin 1.9, lactic acid 6.0, WBCs 13.1, hemoglobin 9.0. Chest x-ray with cardiomegaly and central vascular congestion EKG with rate 196, A. fib with RVR   Assessment & Plan   Atrial fibrillation with RVR (HCC): Known history of atrial fibrillation however was not on anticoagulation due to advanced dementia and fall risk. Patient also has multiple other comorbidities including hypotension, leukocytosis, severe hypernatremia with acute renal insufficiency and lactic acidosis on admission -  Placed on morphine IV and Ativan IV as needed for pain and anxiety - Comfort care goals - Palliative medicine consult for assistance with symptom control and disposition  Active Problems:  Dementia with failure to thrive - Continue comfort care goals   Acute renal failure superimposed on stage 4 chronic kidney disease (HCC), severe hypernatremia with lactic acidosis, hypotension - Patient on gentle hydration with half normal saline   Code Status: dnr  Family Communication: Discussed in detail with the patient, all imaging results, lab results explained to the patient's daughter   Disposition Plan:   Time Spent in minutes    Procedures  None   Consults   Cardiology Palliative medicine   DVT Prophylaxis  SCD's  Medications  Scheduled Meds:  Continuous Infusions: . sodium chloride 75 mL/hr (06/14/15 0531)   PRN Meds:.acetaminophen **OR** acetaminophen, LORazepam, morphine injection, ondansetron **OR** ondansetron (ZOFRAN) IV   Antibiotics   Anti-infectives    None        Subjective:   [name] was seen and examined today.  Comfortable, unresponsive to verbal commands, daughter at the bedside  Objective:   Blood pressure 87/57, pulse 61, temperature 98.1 F (36.7 C), temperature source Oral, resp. rate 19, weight 61.236 kg (135 lb), SpO2 98 %.  Wt Readings from Last 3 Encounters:  06/13/15 61.236 kg (135 lb)  05/28/15 61.236 kg (135 lb)    04/12/15 58.3 kg (128 lb 8.5 oz)     Intake/Output Summary (Last 24 hours) at 06/14/15 1055 Last data filed at 06/13/15 1624  Gross per 24 hour  Intake   2000 ml  Output      0 ml  Net   2000 ml    Exam  General: Unresponsive to verbal commands, comfortable  CVS: S1 S2 clear, regular  Respiratory: Clear to auscultation bilaterally, no wheezing, rales or rhonchi  Abdomen: Soft, nontender, nondistended, + bowel sounds  Ext: no cyanosis clubbing or edema  Neuro: unresponsive  Skin: No rashes  Psych:  unresponsive to verbal commands    Data Review   Micro Results No results found for this or any previous visit (from the past 240 hour(s)).  Radiology Reports Ct Abdomen Pelvis Wo Contrast  05/27/2015  CLINICAL DATA:  Subacute onset of worsening confusion, and leaning toward the right side. Nausea and vomiting. Diffuse right-sided abdominal pain. Initial encounter. EXAM: CT ABDOMEN AND PELVIS WITHOUT CONTRAST TECHNIQUE: Multidetector CT imaging of the abdomen and pelvis was performed following the standard protocol without IV contrast. COMPARISON:  CT of the abdomen and pelvis performed 03/25/2015 FINDINGS: Bibasilar opacities may reflect chronic scarring, as they are similar in appearance to the prior study. A tiny hiatal hernia is noted. The liver and spleen are unremarkable in appearance. The gallbladder is within normal limits. The pancreas and adrenal glands are unremarkable. A 1.8 cm cyst is noted at the interpole region of the right kidney. The kidneys are otherwise unremarkable. There is no evidence of hydronephrosis. No renal or ureteral stones are seen. No perinephric stranding is appreciated. The small bowel is unremarkable in appearance. The stomach is otherwise within normal limits. No acute vascular abnormalities are seen. Scattered calcification is noted along the abdominal aorta and its branches. The appendix is normal in caliber and contains air, without evidence  for appendicitis. Focal wall thickening is noted along the proximal sigmoid colon, with surrounding soft tissue inflammation and trace free fluid, compatible with acute diverticulitis. Scattered diverticulosis is noted along the sigmoid colon. Dense stool is noted filling the rectum, measuring up to 8.1 cm in transverse dimension, concerning for fecal impaction. The bladder is moderately distended and grossly unremarkable. A calcified uterine fibroid is noted. The uterus is otherwise unremarkable. The ovaries are relatively symmetric. No suspicious adnexal masses are seen. No inguinal lymphadenopathy is seen. No acute osseous abnormalities are identified. Compression deformities are noted at L1 and L2, new at L2 since the prior study. IMPRESSION: 1. Acute diverticulitis at the proximal sigmoid colon, with focal wall thickening, surrounding soft tissue inflammation and trace free fluid. No evidence of perforation or abscess formation. Follow-up colonoscopy could be considered after completion of treatment, to exclude an underlying mass, as deemed clinically appropriate. 2. Dense stool noted filling the rectum, measuring up to 8.1 cm  in transverse dimension, concerning for fecal impaction. 3. Scattered diverticulosis along the sigmoid colon. 4. Scattered calcification along the abdominal aorta and its branches. 5. Chronic scarring again noted at the lung bases. 6. Calcified uterine fibroid seen. 7. Tiny hiatal hernia seen. 8. New compression deformity at L2, and chronic compression deformity at L1. Electronically Signed   By: Roanna Raider M.D.   On: 05/27/2015 23:11   Dg Chest Portable 1 View  06/13/2015  CLINICAL DATA:  81 year old female with altered mental status. Initial encounter. EXAM: PORTABLE CHEST 1 VIEW COMPARISON:  05/27/2015. FINDINGS: Cardiomegaly.  Tortuous aorta. Central pulmonary vascular prominence superimposed upon chronic lung changes without segmental consolidation or pneumothorax. IMPRESSION:  Central pulmonary vascular prominence superimposed upon chronic lung changes. Cardiomegaly. Tortuous aorta. Electronically Signed   By: Lacy Duverney M.D.   On: 06/13/2015 15:33   Dg Chest Port 1 View  05/27/2015  CLINICAL DATA:  Vomiting and weakness. EXAM: PORTABLE CHEST 1 VIEW COMPARISON:  03/1915 FINDINGS: Cardiac enlargement without definite vascular congestion. Diffuse bronchial wall thickening with central interstitial fibrosis likely due to chronic bronchitis. Mild bronchiectasis in the lower lobes. Superimposed opacity on the left lung base suggesting superimposed consolidation. This may be due to pneumonia or atelectasis. No pneumothorax. No blunting of costophrenic angles. IMPRESSION: Chronic fibrosis, bronchiectasis, and chronic bronchitic changes in the lungs. Superimposed opacity in the left lung base may represent pneumonia or atelectasis. Electronically Signed   By: Burman Nieves M.D.   On: 05/27/2015 22:02    CBC  Recent Labs Lab 06/13/15 1450  WBC 13.1*  HGB 9.0*  HCT 27.8*  PLT PLATELET CLUMPS NOTED ON SMEAR, COUNT APPEARS DECREASED  MCV 74.3*  MCH 24.1*  MCHC 32.4  RDW 21.1*  LYMPHSABS 1.8  MONOABS 0.8  EOSABS 0.1  BASOSABS 0.0    Chemistries   Recent Labs Lab 06/13/15 1450  NA 155*  K 4.3  CL 128*  CO2 16*  GLUCOSE 90  BUN 32*  CREATININE 3.08*  CALCIUM 8.4*  AST 27  ALT 10*  ALKPHOS 55  BILITOT 1.0   ------------------------------------------------------------------------------------------------------------------ estimated creatinine clearance is 9.4 mL/min (by C-G formula based on Cr of 3.08). ------------------------------------------------------------------------------------------------------------------ No results for input(s): HGBA1C in the last 72 hours. ------------------------------------------------------------------------------------------------------------------ No results for input(s): CHOL, HDL, LDLCALC, TRIG, CHOLHDL, LDLDIRECT  in the last 72 hours. ------------------------------------------------------------------------------------------------------------------ No results for input(s): TSH, T4TOTAL, T3FREE, THYROIDAB in the last 72 hours.  Invalid input(s): FREET3 ------------------------------------------------------------------------------------------------------------------ No results for input(s): VITAMINB12, FOLATE, FERRITIN, TIBC, IRON, RETICCTPCT in the last 72 hours.  Coagulation profile No results for input(s): INR, PROTIME in the last 168 hours.  No results for input(s): DDIMER in the last 72 hours.  Cardiac Enzymes No results for input(s): CKMB, TROPONINI, MYOGLOBIN in the last 168 hours.  Invalid input(s): CK ------------------------------------------------------------------------------------------------------------------ Invalid input(s): POCBNP   Recent Labs  06/13/15 1422  GLUCAP 84     RAI,RIPUDEEP M.D. [facility] 06/14/2015, 10:55 AM  Pager: [phone] Between 7am to 7pm - call Pager - [phone]  After 7pm go to [email or web address] - password [email or web address]  Call night coverage person covering after 7pm

## 2015-06-14 NOTE — Consult Note (Signed)
Consultation Note Date: 06/14/2015   Patient Name: Shelly [name]  DOB: 1919/05/29  MRN: [id number]  Age / Sex: 81 y.o., female  PCP: Cain Saupe, MD Referring Physician: Cathren Harsh, MD  Reason for Consultation: Establishing goals of care, Non pain symptom management and Pain control  Clinical Assessment/Narrative: Patient is a 81 year old female with advanced dementia, failure to thrive, bedridden, CVA, atrial fibrillation (on aspirin), hypertension, arthritis presented to ED with altered mental status, lethargy and unresponsive episode.  Cardiology was consulted and patient was seen by Dr. Excell Seltzer who had a lengthy discussion with patient's family and family requested to pursue comfort care. Palliative consulted to assist with symptom management as well as options for disposition with hospice if she is stable enough to consider discharge.  Contacts/Participants in Discussion: Patient's daughter and 2 sons  SUMMARY OF RECOMMENDATIONS - Patient is comfort measures only. Resting comfortably on exam. - Recommend continue with current care including as needed morphine and Ativan.  - I added on oral medications for use for comfort in case she loses IV access. If she does lose IV access, I would focus on using oral regimen rather than trying to reestablish IV as she is a difficult stick and is now more dehydrated since IV fluids have been stopped. If her symptoms are not able to be controlled with oral regimen, I would use subcutaneous route for delivery of opioids for pain or shortness of breath. - There is a high likelihood this will be a terminal admission. If she does appear stable enough to consider transfer, her daughter reports the goal will be to get her home with hospice support. We also discussed options of residential hospice facility. Will plan to follow-up with the family tomorrow in order to continue to assess her  clinical course and help facilitate discharge planning if appropriate.  Code Status/Advance Care Planning: DNR    Code Status Orders        Start     Ordered   06/14/15 1817  Do not attempt resuscitation (DNR)   Continuous    Question Answer Comment  In the event of cardiac or respiratory ARREST Do not call a "code blue"   In the event of cardiac or respiratory ARREST Do not perform Intubation, CPR, defibrillation or ACLS   In the event of cardiac or respiratory ARREST Use medication by any route, position, wound care, and other measures to relive pain and suffering. May use oxygen, suction and manual treatment of airway obstruction as needed for comfort.      06/14/15 1816    Advance Directive Documentation        Most Recent Value   Type of Advance Directive  Living will, Out of facility DNR (pink MOST or yellow form)   Pre-existing out of facility DNR order (yellow form or pink MOST form)  Yellow form placed in chart (order not valid for inpatient use)   "MOST" Form in Place?       Symptom Management:   Pain/shortness of breath: Currently has morphine ordered on as-needed basis. She was receiving this frequently this morning but has been several hours since her last dose. I discussed with family option of continuous infusion of medication if she continues to require frequent when necessary dosing. Family requested not to make any changes at this time as she is resting very comfortably on her current regimen. With her renal function, she would have a risk of developing adverse reactions to metabolite buildup from morphine, however, I  feel that this is likely low risk and family requests to continue with current medication. If she did begin to develop any signs of this, would recommend transition to Dilaudid.  Anxiety: Ativan as needed  Agitation: Haldol as needed  Constipation: Dulcolax as needed  Excess secretions: Robinul as needed  Palliative Prophylaxis:   Bowel Regimen,  Delirium Protocol and Frequent Pain Assessment  Additional Recommendations (Limitations, Scope, Preferences):  Full Comfort Care  Psycho-social/Spiritual:  Support System: Strong Desire for further Chaplaincy support:no Additional Recommendations: Education on Hospice  Prognosis: Hours - Days  Discharge Planning: There is a high likelihood this will be a terminal admission. I did discuss with family options for discharge including home with hospice support versus residential hospice facility if her condition were to stabilize. Her daughter reports that the goal would be to take the patient to her home with hospice support. She and her friend report that the patient has been total care at home since October and this is something they feel they can manage. Based upon her examination this evening, however, I do not think it is likely that she will be stable enough to consider transfer. Will plan to reexamine tomorrow and continue discussion that time based upon her clinical course.   Chief Complaint/ Primary Diagnoses: Present on Admission:  . Hypotension . Acute renal failure superimposed on stage 4 chronic kidney disease (HCC) . Hypernatremia . Dementia . DNR (do not resuscitate) . Atrial fibrillation with RVR (HCC) . Failure to thrive in adult . Atrial fibrillation (HCC)  I have reviewed the medical record, interviewed the patient and family, and examined the patient. The following aspects are pertinent.  Past Medical History  Diagnosis Date  . A-fib (HCC)   . Diverticulitis   . Hypertension   . Dementia   . Arthritis    Social History   Social History  . Marital Status: Single    Spouse Name: Tasia Catchings  . Number of Children: 4  . Years of Education: 12+   Occupational History  .     Social History Main Topics  . Smoking status: Never Smoker   . Smokeless tobacco: None  . Alcohol Use: No  . Drug Use: No  . Sexual Activity: No   Other Topics Concern  . None    Social History Narrative   ** Merged History Encounter **       Patient lives at home with her husband Tasia Catchings).   Patient has four children.   Patient has a high school and trade school education.   Patient is retired.   Patient does drink caffeine- one cup of coffee daily.   Patient is right-handed.   Family History  Problem Relation Age of Onset  . Hypertension Daughter   . High blood pressure    . Cancer Neg Hx   . Thyroid nodules     Scheduled Meds:  Continuous Infusions: . sodium chloride 75 mL/hr (06/14/15 0531)   PRN Meds:.acetaminophen **OR** acetaminophen, glycopyrrolate **OR** glycopyrrolate **OR** glycopyrrolate, haloperidol **OR** haloperidol **OR** haloperidol lactate, LORazepam, LORazepam, morphine injection, morphine CONCENTRATE **OR** morphine CONCENTRATE, ondansetron **OR** ondansetron (ZOFRAN) IV Medications Prior to Admission:  Prior to Admission medications   Medication Sig Start Date End Date Taking? Authorizing Provider  ALPRAZolam (XANAX) 0.25 MG tablet Take 0.125 mg by mouth 2 (two) times daily as needed for anxiety.   Yes Historical Provider, MD  aspirin EC 81 MG tablet Take 1 tablet (81 mg total) by mouth daily. 11/28/13  Yes  Evlyn Kanner Love, PA-C  ciprofloxacin (CIPRO) 500 MG/5ML (10%) suspension Take 5 mLs (500 mg total) by mouth 2 (two) times daily. 06/02/15  Yes Alison Murray, MD  docusate sodium (COLACE) 100 MG capsule Take 100 mg by mouth daily as needed for mild constipation.   Yes Historical Provider, MD  furosemide (LASIX) 20 MG tablet Take 1 tablet (20 mg total) by mouth daily. 02/08/15  Yes Dione Booze, MD  HYDROcodone-acetaminophen (NORCO/VICODIN) 5-325 MG per tablet Take 1-2 tablets by mouth every 8 (eight) hours as needed. Severe pain. 03/04/15  Yes Historical Provider, MD  magnesium oxide (MAG-OX) 400 (241.3 MG) MG tablet Take 1 tablet (400 mg total) by mouth daily. 12/14/13  Yes Christiane Ha, MD  metoprolol tartrate (LOPRESSOR) 25 MG  tablet Take 1 tablet (25 mg total) by mouth 2 (two) times daily. 12/14/13  Yes Christiane Ha, MD  mirtazapine (REMERON) 15 MG tablet Take 15 mg by mouth at bedtime.   Yes Historical Provider, MD  pantoprazole (PROTONIX) 40 MG tablet Take 40 mg by mouth daily.   Yes Historical Provider, MD  predniSONE (DELTASONE) 5 MG tablet Take 5 mg by mouth every morning.    Yes Historical Provider, MD  metroNIDAZOLE (FLAGYL) 50 mg/ml oral suspension Take 10 mLs (500 mg total) by mouth 3 (three) times daily. Patient not taking: Reported on 06/13/2015 06/02/15   Alison Murray, MD   Allergies  Allergen Reactions  . Aricept [Donepezil Hcl] Nausea And Vomiting    She becomes violently ill per daughter.    Review of Systems  Unable to perform ROS   Physical Exam  Constitutional: No distress.  Elderly female, lying in bed, opens eyes briefly but not to command and does not track examiner  HENT:  Head: Normocephalic and atraumatic.  Eyes: Right eye exhibits no discharge. Left eye exhibits no discharge. No scleral icterus.  Neck: No JVD present.  Cardiovascular:  Distant, no palpable pulses radial or dorsal pedal  Respiratory: No stridor. No respiratory distress.  There is a few seconds of apnea noted  GI: Soft. Bowel sounds are normal. She exhibits no distension. There is no tenderness.  Musculoskeletal: She exhibits edema.  Neurological:  Does not open eyes to command  Skin: Skin is warm and dry. She is not diaphoretic.    Vital Signs: BP 94/41 mmHg  Pulse 101  Temp(Src) 98.9 F (37.2 C) (Oral)  Resp 20  Wt 61.236 kg (135 lb)  SpO2 92%  SpO2: SpO2: 92 % O2 Device:SpO2: 92 % O2 Flow Rate: .O2 Flow Rate (L/min): 2 L/min  IO: Intake/output summary: No intake or output data in the 24 hours ending 06/14/15 2024  LBM: Last BM Date: 06/13/15 Baseline Weight: Weight: 61.236 kg (135 lb) Most recent weight: Weight: 61.236 kg (135 lb)      Palliative Assessment/Data:  Flowsheet Rows         Most Recent Value   Intake Tab    Referral Department  Hospitalist   Unit at Time of Referral  Orthopedic Unit   Palliative Care Primary Diagnosis  Other (Comment) [FTT]   Date Notified  06/14/15   Palliative Care Type  Return patient Palliative Care   Reason for referral  Clarify Goals of Care   Date of Admission  06/13/15   Date first seen by Palliative Care  06/14/15   # of days Palliative referral response time  0 Day(s)   # of days IP prior to Palliative referral  1  Clinical Assessment    Palliative Performance Scale Score  10%   Pain Max last 24 hours  Not able to report   Pain Min Last 24 hours  Not able to report   Psychosocial & Spiritual Assessment    Palliative Care Outcomes    Patient/Family meeting held?  Yes      Additional Data Reviewed:  CBC:    Component Value Date/Time   WBC 13.1* 06/13/2015 1450   HGB 9.0* 06/13/2015 1450   HCT 27.8* 06/13/2015 1450   PLT  06/13/2015 1450    PLATELET CLUMPS NOTED ON SMEAR, COUNT APPEARS DECREASED   MCV 74.3* 06/13/2015 1450   NEUTROABS 10.4* 06/13/2015 1450   LYMPHSABS 1.8 06/13/2015 1450   MONOABS 0.8 06/13/2015 1450   EOSABS 0.1 06/13/2015 1450   BASOSABS 0.0 06/13/2015 1450   Comprehensive Metabolic Panel:    Component Value Date/Time   NA 155* 06/13/2015 1450   K 4.3 06/13/2015 1450   CL 128* 06/13/2015 1450   CO2 16* 06/13/2015 1450   BUN 32* 06/13/2015 1450   CREATININE 3.08* 06/13/2015 1450   GLUCOSE 90 06/13/2015 1450   CALCIUM 8.4* 06/13/2015 1450   AST 27 06/13/2015 1450   ALT 10* 06/13/2015 1450   ALKPHOS 55 06/13/2015 1450   BILITOT 1.0 06/13/2015 1450   PROT 6.9 06/13/2015 1450   ALBUMIN 1.9* 06/13/2015 1450     Time In: 1505 Time Out: 1610 Time Total: 65 Greater than 50%  of this time was spent counseling and coordinating care related to the above assessment and plan.  Signed by: Romie Minus, MD  Romie Minus, MD  06/14/2015, 8:24 PM  Please contact Palliative Medicine Team phone  at [phone] for questions and concerns.

## 2015-06-15 DIAGNOSIS — L8990 Pressure ulcer of unspecified site, unspecified stage: Secondary | ICD-10-CM | POA: Insufficient documentation

## 2015-06-15 DIAGNOSIS — Z515 Encounter for palliative care: Secondary | ICD-10-CM | POA: Insufficient documentation

## 2015-06-15 MED ORDER — MORPHINE SULFATE (CONCENTRATE) 10 MG/0.5ML PO SOLN: 5.0000 mg | ORAL | Status: AC | PRN

## 2015-06-15 MED ORDER — LORAZEPAM 2 MG/ML PO CONC: 1.0000 mg | ORAL | Status: AC | PRN

## 2015-06-15 MED ORDER — GLYCOPYRROLATE 1 MG PO TABS: 1.0000 mg | ORAL_TABLET | ORAL | Status: AC | PRN

## 2015-06-15 MED ORDER — WHITE PETROLATUM GEL
Status: AC
  Administered 2015-06-15: 05:00:00
  Filled 2015-06-15: qty 1

## 2015-06-15 NOTE — Progress Notes (Signed)
Daily Progress Note   Patient Name: Shelly [name]       Date: 06/15/2015 DOB: 1919/02/27  Age: 81 y.o. MRN#: [id number] Attending Physician: Cathren Harsh, MD Primary Care Physician: Cain Saupe, MD Admit Date: 06/13/2015  Reason for Consultation/Follow-up: Disposition and Terminal Care  Subjective: Shelly [name] is lying in bed sleeping on entering the room. She does open eyes briefly to tactile stimulation but immediately falls back to sleep. There is no meaningful interaction when her eyes are open.  Her daughter remains at the bedside and reports that she thinks her mother rested comfortably overnight. She has had a decrease in the amount of morphine that she has been requiring. She has no concerns regarding care her mother is been receiving in terms of ensuring her comfort.  We talked about next steps and that if her mother remained stable enough to leave the hospital this is something that we can pursue.  She reports that if her mother were to leave the hospital, her preference would be to take her to residential hospice facility such as [facility]. She does express concern regarding the cost of this.  Length of Stay: 2 days  Current Medications: Scheduled Meds:     Continuous Infusions: . sodium chloride 75 mL/hr (06/14/15 0531)    PRN Meds: acetaminophen **OR** acetaminophen, bisacodyl, glycopyrrolate **OR** glycopyrrolate **OR** glycopyrrolate, haloperidol **OR** haloperidol **OR** haloperidol lactate, LORazepam, LORazepam, morphine injection, morphine CONCENTRATE **OR** morphine CONCENTRATE, ondansetron **OR** ondansetron (ZOFRAN) IV  Physical Exam: Physical Exam             Constitutional: No distress.  Elderly female, lying in bed, opens eyes briefly but not to command  and does not track examiner  HENT:  Head: Normocephalic and atraumatic.  Eyes: Right eye exhibits no discharge. Left eye exhibits no discharge. No scleral icterus.  Neck: No JVD present.  Cardiovascular:  Distant, no palpable pulses radial or dorsal pedal  Respiratory: No stridor. No respiratory distress.  There is a few seconds of apnea noted  GI: Soft. Bowel sounds are normal. She exhibits no distension. There is no tenderness.  Musculoskeletal: She exhibits edema.  Neurological:  Does not open eyes to command  Skin: Skin is warm and dry. She is not diaphoretic.   Vital Signs: BP 76/28 mmHg  Pulse 58  Temp(Src) 100.3 F (37.9 C) (Oral)  Resp 18  Wt 61.236 kg (135 lb)  SpO2 97% SpO2: SpO2: 97 % O2 Device: O2 Device: Nasal Cannula O2 Flow Rate: O2 Flow Rate (L/min): 2 L/min  Intake/output summary: No intake or output data in the 24 hours ending 06/15/15 1123 LBM: Last BM Date: 06/13/15 Baseline Weight: Weight: 61.236 kg (135 lb) Most recent weight: Weight: 61.236 kg (135 lb)       Palliative Assessment/Data: Flowsheet Rows        Most Recent Value   Intake Tab    Referral Department  Hospitalist   Unit at Time of Referral  Orthopedic Unit   Palliative Care Primary Diagnosis  Other (Comment) [FTT]   Date Notified  06/14/15   Palliative Care Type  Return patient Palliative Care   Reason for referral  Clarify Goals of Care   Date of Admission  06/13/15   Date first seen by Palliative Care  06/14/15   # of days Palliative referral response time  0 Day(s)   # of days IP prior to Palliative referral  1   Clinical Assessment    Palliative Performance Scale Score  10%   Pain Max last 24 hours  Not able to report   Pain Min Last 24 hours  Not able to report   Psychosocial & Spiritual Assessment    Palliative Care Outcomes    Patient/Family meeting held?  Yes      Additional Data Reviewed: CBC    Component Value Date/Time   WBC 13.1* 06/13/2015 1450   RBC 3.74*  06/13/2015 1450   HGB 9.0* 06/13/2015 1450   HCT 27.8* 06/13/2015 1450   PLT  06/13/2015 1450    PLATELET CLUMPS NOTED ON SMEAR, COUNT APPEARS DECREASED   MCV 74.3* 06/13/2015 1450   MCH 24.1* 06/13/2015 1450   MCHC 32.4 06/13/2015 1450   RDW 21.1* 06/13/2015 1450   LYMPHSABS 1.8 06/13/2015 1450   MONOABS 0.8 06/13/2015 1450   EOSABS 0.1 06/13/2015 1450   BASOSABS 0.0 06/13/2015 1450    CMP     Component Value Date/Time   NA 155* 06/13/2015 1450   K 4.3 06/13/2015 1450   CL 128* 06/13/2015 1450   CO2 16* 06/13/2015 1450   GLUCOSE 90 06/13/2015 1450   BUN 32* 06/13/2015 1450   CREATININE 3.08* 06/13/2015 1450   CALCIUM 8.4* 06/13/2015 1450   PROT 6.9 06/13/2015 1450   ALBUMIN 1.9* 06/13/2015 1450   AST 27 06/13/2015 1450   ALT 10* 06/13/2015 1450   ALKPHOS 55 06/13/2015 1450   BILITOT 1.0 06/13/2015 1450   GFRNONAA 12* 06/13/2015 1450   GFRAA 14* 06/13/2015 1450       Problem List:  Patient Active Problem List   Diagnosis Date Noted  . Pressure ulcer 06/15/2015  . Hypotension 06/13/2015  . Paroxysmal atrial fibrillation with rapid ventricular response (HCC) 06/13/2015  . DNR (do not resuscitate) 06/13/2015  . Atrial fibrillation with RVR (HCC) 06/13/2015  . Failure to thrive in adult 06/13/2015  . Atrial fibrillation (HCC) 06/13/2015  . Hypernatremia 05/30/2015  . Abdominal pain 05/29/2015  . Acute renal failure superimposed on stage 4 chronic kidney disease (HCC) 05/29/2015  . Hypokalemia 05/29/2015  . Anemia of chronic kidney failure 05/29/2015  . Leukocytosis 05/29/2015  . Functional quadriplegia (HCC) 05/29/2015  . Fecal impaction (HCC) 05/28/2015  . Acute diverticulitis   . Encounter for palliative care   . Goals of care, counseling/discussion   . Paroxysmal atrial  fibrillation (HCC) 04/12/2015  . Recurrent falls 04/12/2015  . Generalized weakness 04/12/2015  . Dementia 12/11/2013  . Acute encephalopathy 12/10/2013  . History of hemorrhagic stroke  with residual hemiparesis (HCC) 12/10/2013  . Rheumatoid arthritis (HCC) 10/21/2012  . Hypertension 10/21/2012     Palliative Care Assessment & Plan    1.Code Status:  DNR    Code Status Orders        Start     Ordered   06/14/15 1817  Do not attempt resuscitation (DNR)   Continuous    Question Answer Comment  In the event of cardiac or respiratory ARREST Do not call a "code blue"   In the event of cardiac or respiratory ARREST Do not perform Intubation, CPR, defibrillation or ACLS   In the event of cardiac or respiratory ARREST Use medication by any route, position, wound care, and other measures to relive pain and suffering. May use oxygen, suction and manual treatment of airway obstruction as needed for comfort.      06/14/15 1816    Advance Directive Documentation        Most Recent Value   Type of Advance Directive  Living will, Out of facility DNR (pink MOST or yellow form)   Pre-existing out of facility DNR order (yellow form or pink MOST form)  Yellow form placed in chart (order not valid for inpatient use)   "MOST" Form in Place?         2. Goals of Care/Additional Recommendations:  Comfort care. Family is open to information regarding placement at [facility]  3. Symptom Management:  Pain/shortness of breath: Currently has morphine ordered on as-needed basis. She was receiving this frequently yesterday with significant decrease in her needs overnight.  Family requested not to make any changes at this time as she is resting very comfortably on her current regimen. With her renal function, she would have a risk of developing adverse reactions to metabolite buildup from morphine, however, I feel that this is likely low risk and family requests to continue with current medication. If she did begin to develop any signs of this, would recommend transition to Dilaudid.  Anxiety: Ativan as needed  Agitation: Haldol as needed  Constipation: Dulcolax as  needed  Excess secretions: Robinul as needed  Fever: Tylenol suppository as needed  4. Palliative Prophylaxis:   Bowel Regimen and Frequent Pain Assessment  5. Prognosis: Hours - Days Shelly [name] is a 80 year old female with advanced dementia with acute renal failure.  She is largely somnolent and not taking any by nutrition or hydration by mouth.  Additionally, she has developed infection and meets SIRS criteria. Her expected prognosis is in a matter of hours to days.  6. Discharge Planning:    I discussed with family options for discharge and they are interested in [facility]. Her daughter expressed concern about financial situation and being able to pay for such care, and I discussed with them that this is something that the [facility] can go over with them. I noted that payment is based on a sliding scale and at the hospice agency is very good at ensuring that cost is not a prohibative factor when it comes to pursuing end-of-life care [facility]. Shelly [name] is actively dying and if she does not remain stable for transfer to [facility], this will be a terminal admission.   Care plan was discussed with patient's daughter and Dr. Isidoro Donning  Thank you for allowing the Palliative Medicine Team to assist in  the care of this patient.   Time In: 0820 Time Out: 0850 Total Time 30 Prolonged Time Billed  no         Romie Minus, MD  06/15/2015, 11:23 AM  Please contact Palliative Medicine Team phone at [phone] for questions and concerns.

## 2015-06-15 NOTE — Progress Notes (Signed)
[facility]                                                                              Patient Demographics  Shelly [name], is a 81 y.o. female, DOB - October 11, 1918, MGQ:[id number]  Admit date - 06/13/2015   Admitting Physician Ripudeep Jenna Luo, MD  Outpatient Primary MD for the patient is FULP, CAMMIE, MD  LOS - 2   Chief Complaint  Patient presents with  . Altered Mental Status  . Tachycardia       Brief HPI   Patient is a 81 year old female with advanced dementia, failure to thrive, bedridden, CVA, atrial fibrillation (on aspirin), hypertension, arthritis presented to ED with altered mental status, lethargy and unresponsive episode. Patient lives at home with her family. At the time of the encounter, patient is unable to provide any history. Patient has multiple family members in the room and history was provided by them including daughter, grandson, granddaughter. Per family, this morning after breakfast, patient was noticed to be less responsive, had a near syncopal episode and not responding appropriately to questions. They checked her blood pressure and was unrecordable. EMS was called, patient was found to be tachycardiac with HR 100s, hypotensive 90/60 and an atrial fibrillation with RVR. For last few days, patient has been complaining of pain in her lower back, per daughter has a hairline fracture in the lower back. Patient has been declining significantly over the last 2-3 months. Family has Hoyer lift for the patient at home. In ED, BP initially improved with IV fluids, patient was given IV Cardizem. However subsequently her pressure dropped to 60-70s. She also received morphine and fentanyl 25 mcg 1 for back pain. Cardiology was consulted and patient was seen by Dr. Excell Seltzer who had a lengthy discussion with patient's family and family at this time requested Comfort Care status. Labs reviewed, sodium 155, bicarbonate 16, BUN 32, creatinine 3.08 (creatinine was 0.95 on  06/02/15), albumin 1.9, lactic acid 6.0, WBCs 13.1, hemoglobin 9.0. Chest x-ray with cardiomegaly and central vascular congestion EKG with rate 196, A. fib with RVR   Assessment & Plan   Atrial fibrillation with RVR (HCC): Known history of atrial fibrillation however was not on anticoagulation due to advanced dementia and fall risk. Patient also has multiple other comorbidities including hypotension, leukocytosis, severe hypernatremia with acute renal insufficiency and lactic acidosis on admission -  Placed on morphine IV, Haldol, Ativan  as needed for pain and anxiety - Comfort care goals - Palliative medicine following, discussed with Dr Neale Burly today, family agreeable to [facility]  Active Problems:  Dementia with failure to thrive - Continue comfort care goals   Acute renal failure superimposed on stage 4 chronic kidney disease (HCC), severe hypernatremia with lactic acidosis, hypotension - Patient on gentle hydration with half normal saline  Coagulase negative bacteremia 1/2 likely contaminant   Code Status: dnr  Family Communication: Discussed in detail with the patient, all imaging results, lab results explained to the patient's daughter   Disposition Plan: [facility] when bed available, likely tomorrow   Time Spent in minutes    Procedures  None  Consults   Cardiology Palliative medicine   DVT Prophylaxis  SCD's  Medications  Scheduled Meds:  Continuous Infusions: . sodium chloride 75 mL/hr (06/14/15 0531)   PRN Meds:.acetaminophen **OR** acetaminophen, bisacodyl, glycopyrrolate **OR** glycopyrrolate **OR** glycopyrrolate, haloperidol **OR** haloperidol **OR** haloperidol lactate, LORazepam, LORazepam, morphine injection, morphine CONCENTRATE **OR** morphine CONCENTRATE, ondansetron **OR** ondansetron (ZOFRAN) IV   Antibiotics   Anti-infectives    None        Subjective:   Shelly [name] was seen and examined today.  Comfortable,  arousable to name and verbal commands otherwise comfortable. Daughter at the bedside  Objective:   Blood pressure 76/28, pulse 58, temperature 100.3 F (37.9 C), temperature source Oral, resp. rate 18, weight 61.236 kg (135 lb), SpO2 97 %.  Wt Readings from Last 3 Encounters:  06/13/15 61.236 kg (135 lb)  05/28/15 61.236 kg (135 lb)  04/12/15 58.3 kg (128 lb 8.5 oz)    No intake or output data in the 24 hours ending 06/15/15 1102  Exam  General: Comfortable  CVS: S1 S2 clear, regular  Respiratory: Clear to auscultation bilaterally  Abdomen: Soft, NT  Ext: no cyanosis clubbing or edema   Data Review   Micro Results Recent Results (from the past 240 hour(s))  Blood culture (routine x 2)     Status: None (Preliminary result)   Collection Time: 06/13/15  3:03 PM  Result Value Ref Range Status   Specimen Description BLOOD LEFT HAND  Final   Special Requests   Final    BOTTLES DRAWN AEROBIC AND ANAEROBIC BLUE 5CC RED 3CC   Culture  Setup Time   Final    GRAM POSITIVE COCCI IN CLUSTERS IN BOTH AEROBIC AND ANAEROBIC BOTTLES CRITICAL RESULT CALLED TO, READ BACK BY AND VERIFIED WITH: M CRANFORD,RN AT 1434 06/14/15 BY L BENFIELD    Culture   Final    STAPHYLOCOCCUS SPECIES (COAGULASE NEGATIVE) THE SIGNIFICANCE OF ISOLATING THIS ORGANISM FROM A SINGLE SET OF BLOOD CULTURES WHEN MULTIPLE SETS ARE DRAWN IS UNCERTAIN. PLEASE NOTIFY THE MICROBIOLOGY DEPARTMENT WITHIN ONE WEEK IF SPECIATION AND SENSITIVITIES ARE REQUIRED.    Report Status PENDING  Incomplete  Blood culture (routine x 2)     Status: None (Preliminary result)   Collection Time: 06/13/15  4:45 PM  Result Value Ref Range Status   Specimen Description BLOOD LEFT HAND  Final   Special Requests IN PEDIATRIC BOTTLE 3CC  Final   Culture NO GROWTH < 24 HOURS  Final   Report Status PENDING  Incomplete    Radiology Reports Ct Abdomen Pelvis Wo Contrast  05/27/2015  CLINICAL DATA:  Subacute onset of worsening confusion,  and leaning toward the right side. Nausea and vomiting. Diffuse right-sided abdominal pain. Initial encounter. EXAM: CT ABDOMEN AND PELVIS WITHOUT CONTRAST TECHNIQUE: Multidetector CT imaging of the abdomen and pelvis was performed following the standard protocol without IV contrast. COMPARISON:  CT of the abdomen and pelvis performed 03/25/2015 FINDINGS: Bibasilar opacities may reflect chronic scarring, as they are similar in appearance to the prior study. A tiny hiatal hernia is noted. The liver and spleen are unremarkable in appearance. The gallbladder is within normal limits. The pancreas and adrenal glands are unremarkable. A 1.8 cm cyst is noted at the interpole region of the right kidney. The kidneys are otherwise unremarkable. There is no evidence of hydronephrosis. No renal or ureteral stones are seen. No perinephric stranding is appreciated. The small bowel is unremarkable in appearance. The stomach is otherwise within normal limits. No acute  vascular abnormalities are seen. Scattered calcification is noted along the abdominal aorta and its branches. The appendix is normal in caliber and contains air, without evidence for appendicitis. Focal wall thickening is noted along the proximal sigmoid colon, with surrounding soft tissue inflammation and trace free fluid, compatible with acute diverticulitis. Scattered diverticulosis is noted along the sigmoid colon. Dense stool is noted filling the rectum, measuring up to 8.1 cm in transverse dimension, concerning for fecal impaction. The bladder is moderately distended and grossly unremarkable. A calcified uterine fibroid is noted. The uterus is otherwise unremarkable. The ovaries are relatively symmetric. No suspicious adnexal masses are seen. No inguinal lymphadenopathy is seen. No acute osseous abnormalities are identified. Compression deformities are noted at L1 and L2, new at L2 since the prior study. IMPRESSION: 1. Acute diverticulitis at the proximal  sigmoid colon, with focal wall thickening, surrounding soft tissue inflammation and trace free fluid. No evidence of perforation or abscess formation. Follow-up colonoscopy could be considered after completion of treatment, to exclude an underlying mass, as deemed clinically appropriate. 2. Dense stool noted filling the rectum, measuring up to 8.1 cm in transverse dimension, concerning for fecal impaction. 3. Scattered diverticulosis along the sigmoid colon. 4. Scattered calcification along the abdominal aorta and its branches. 5. Chronic scarring again noted at the lung bases. 6. Calcified uterine fibroid seen. 7. Tiny hiatal hernia seen. 8. New compression deformity at L2, and chronic compression deformity at L1. Electronically Signed   By: Roanna Raider M.D.   On: 05/27/2015 23:11   Dg Chest Portable 1 View  06/13/2015  CLINICAL DATA:  81 year old female with altered mental status. Initial encounter. EXAM: PORTABLE CHEST 1 VIEW COMPARISON:  05/27/2015. FINDINGS: Cardiomegaly.  Tortuous aorta. Central pulmonary vascular prominence superimposed upon chronic lung changes without segmental consolidation or pneumothorax. IMPRESSION: Central pulmonary vascular prominence superimposed upon chronic lung changes. Cardiomegaly. Tortuous aorta. Electronically Signed   By: Lacy Duverney M.D.   On: 06/13/2015 15:33   Dg Chest Port 1 View  05/27/2015  CLINICAL DATA:  Vomiting and weakness. EXAM: PORTABLE CHEST 1 VIEW COMPARISON:  03/1915 FINDINGS: Cardiac enlargement without definite vascular congestion. Diffuse bronchial wall thickening with central interstitial fibrosis likely due to chronic bronchitis. Mild bronchiectasis in the lower lobes. Superimposed opacity on the left lung base suggesting superimposed consolidation. This may be due to pneumonia or atelectasis. No pneumothorax. No blunting of costophrenic angles. IMPRESSION: Chronic fibrosis, bronchiectasis, and chronic bronchitic changes in the lungs.  Superimposed opacity in the left lung base may represent pneumonia or atelectasis. Electronically Signed   By: Burman Nieves M.D.   On: 05/27/2015 22:02    CBC  Recent Labs Lab 06/13/15 1450  WBC 13.1*  HGB 9.0*  HCT 27.8*  PLT PLATELET CLUMPS NOTED ON SMEAR, COUNT APPEARS DECREASED  MCV 74.3*  MCH 24.1*  MCHC 32.4  RDW 21.1*  LYMPHSABS 1.8  MONOABS 0.8  EOSABS 0.1  BASOSABS 0.0    Chemistries   Recent Labs Lab 06/13/15 1450  NA 155*  K 4.3  CL 128*  CO2 16*  GLUCOSE 90  BUN 32*  CREATININE 3.08*  CALCIUM 8.4*  AST 27  ALT 10*  ALKPHOS 55  BILITOT 1.0   ------------------------------------------------------------------------------------------------------------------ estimated creatinine clearance is 9.4 mL/min (by C-G formula based on Cr of 3.08). ------------------------------------------------------------------------------------------------------------------ No results for input(s): HGBA1C in the last 72 hours. ------------------------------------------------------------------------------------------------------------------ No results for input(s): CHOL, HDL, LDLCALC, TRIG, CHOLHDL, LDLDIRECT in the last 72 hours. ------------------------------------------------------------------------------------------------------------------ No results for input(s): TSH,  T4TOTAL, T3FREE, THYROIDAB in the last 72 hours.  Invalid input(s): FREET3 ------------------------------------------------------------------------------------------------------------------ No results for input(s): VITAMINB12, FOLATE, FERRITIN, TIBC, IRON, RETICCTPCT in the last 72 hours.  Coagulation profile No results for input(s): INR, PROTIME in the last 168 hours.  No results for input(s): DDIMER in the last 72 hours.  Cardiac Enzymes No results for input(s): CKMB, TROPONINI, MYOGLOBIN in the last 168 hours.  Invalid input(s):  CK ------------------------------------------------------------------------------------------------------------------ Invalid input(s): POCBNP   Recent Labs  06/13/15 1422  GLUCAP 84     RAI,RIPUDEEP M.D. [facility] 06/15/2015, 11:02 AM  Pager: [phone] Between 7am to 7pm - call Pager - [phone]  After 7pm go to [email or web address] - password [email or web address]  Call night coverage person covering after 7pm

## 2015-06-15 NOTE — Progress Notes (Signed)
[facility] [employer]   Received request from [place], [place] for family interest in [facility]. Chart reviewed. Appreciated report from Dr. Domingo Cocking. [facility] available tomorrow. Met with family at [facility] for explanation of services and then to complete paperwork for transfer tomorrow.  ? Please fax discharge summary to [phone]. ? RN please call report to [phone]. ? Please arrange transport for as early in day as possible.  ? Thank you, Freddi Starr RN, [facility] Liaison [phone]

## 2015-06-15 NOTE — Clinical Social Work Note (Signed)
Clinical Social Work Assessment  Patient Details  Name: Shelly [name] MRN: [id number] Date of Birth: June 18, 1919  Date of referral:  06/15/15               Reason for consult:  End of Life/Hospice                Permission sought to share information with:  Family Supports Permission granted to share information::  Yes, Verbal Permission Granted  Name::     Daughters  Agency::     Relationship::  Daughters  Contact Information:  [phone]  Housing/Transportation Living arrangements for the past 2 months:  Single Family Home Source of Information:  Adult Children Patient Interpreter Needed:  None Criminal Activity/Legal Involvement Pertinent to Current Situation/Hospitalization:  No - Comment as needed Significant Relationships:  Adult Children Lives with:  Adult Children Do you feel safe going back to the place where you live?  Yes Need for family participation in patient care:  Yes (Comment)  Care giving concerns:  No, at this time.   Social Worker assessment / plan:  Met with Pt's family at bedside.  Pt's daughter stated that they are interested in exploring residential Hospice facilities.  She stated that [facility] would be their first choice and gave MSW permission to notify [facility].  Family asked about payment and how residential Hospice is covered under Medicare.  SW and Pt's family spent some time discussing this.  Pt's daughter stated that she'd like to know when the liaison will be by, as she'd like for other family to be present.  Spoke with [place], [place] liaison.  Provided Stacie with Pt's information.  Erline Levine to meet with the family today at 2.  SW notified family that Lanetta Inch will be here to meet with them at 2 today.  Pt's family thanked SW.  Employment status:  Retired Forensic scientist:  Medicare PT Recommendations:  Not assessed at this time Information / Referral to community resources:     Patient/Family's Response to care:  Pt's family  feels that d/c to residential Hospice is the best d/c plan.  Patient/Family's Understanding of and Emotional Response to Diagnosis, Current Treatment, and Prognosis:  Pt's family was coping well with Pt's medical state.  They are hopeful that [facility] will be an option at d/c.  Emotional Assessment Appearance:  Appears stated age Attitude/Demeanor/Rapport:  Unable to Assess Affect (typically observed):  Unable to Assess Orientation:   (not alert/oriented) Alcohol / Substance use:  Never Used Psych involvement (Current and /or in the community):  No (Comment)  Discharge Needs  Concerns to be addressed:    Readmission within the last 30 days:    Current discharge risk:  None Barriers to Discharge:  No Barriers Identified   Matilde Bash, [place] 06/15/2015, 12:51 PM

## 2015-06-16 LAB — CULTURE, BLOOD (ROUTINE X 2)

## 2015-06-16 NOTE — Progress Notes (Signed)
[facility] ready to receive Pt.  Faxed d/c summary to [facility].  SW spoke with family in the room and all are in agreement with d/c to [facility] today.  RN to give report.  SW arranged for ambulance transport.  Pt to be d/c'd.  Providence Crosby, LCSW Clinical Social Work [phone]

## 2015-06-16 NOTE — Progress Notes (Signed)
Removed patient peripheral IV without complication. Patient tolerated well. Called [phone] to give report to [facility]. Nobody was available to answer the phone. 3 attempts made but was not able to give report. I will try later to call for report later today.

## 2015-06-16 NOTE — Discharge Summary (Signed)
Physician Discharge Summary   Patient ID: Shelly [name] MRN: [id number] DOB/AGE: [birth year] 81 y.o.  Admit date: 06/13/2015 Discharge date: 06/16/2015  Primary Care Physician:  Cain Saupe, MD  Discharge Diagnoses:    . Hypotension . Acute renal failure superimposed on stage 4 chronic kidney disease (HCC) . Hypernatremia . advanced Dementia . DNR (do not resuscitate) . Atrial fibrillation with RVR (HCC) . Failure to thrive in adult . sepsis  Consults:  Cardiology, Dr. Excell Seltzer Palliative medicine, Dr Neale Burly  Recommendations for Outpatient Follow-up:  Patient is Comfort Care status, DNR, DNI    DIET:comfort food    Allergies:   Allergies  Allergen Reactions  . Aricept [Donepezil Hcl] Nausea And Vomiting    She becomes violently ill per daughter.     DISCHARGE MEDICATIONS: Current Discharge Medication List    START taking these medications   Details  glycopyrrolate (ROBINUL) 1 MG tablet Take 1 tablet (1 mg total) by mouth every 4 (four) hours as needed (excessive secretions). Qty: 30 tablet, Refills: 0    LORazepam (ATIVAN) 2 MG/ML concentrated solution Take 0.5 mLs (1 mg total) by mouth every 4 (four) hours as needed for anxiety (oral route for use as needed if lose IV access). Qty: 30 mL, Refills: 0    Morphine Sulfate (MORPHINE CONCENTRATE) 10 MG/0.5ML SOLN concentrated solution Place 0.25 mLs (5 mg total) under the tongue every 3 (three) hours as needed for moderate pain, severe pain or shortness of breath (or dyspnea). Qty: 30 mL, Refills: 0      STOP taking these medications     ALPRAZolam (XANAX) 0.25 MG tablet      aspirin EC 81 MG tablet      ciprofloxacin (CIPRO) 500 MG/5ML (10%) suspension      docusate sodium (COLACE) 100 MG capsule      furosemide (LASIX) 20 MG tablet      HYDROcodone-acetaminophen (NORCO/VICODIN) 5-325 MG per tablet      magnesium oxide (MAG-OX) 400 (241.3 MG) MG tablet      metoprolol tartrate (LOPRESSOR) 25 MG  tablet      mirtazapine (REMERON) 15 MG tablet      pantoprazole (PROTONIX) 40 MG tablet      predniSONE (DELTASONE) 5 MG tablet      metroNIDAZOLE (FLAGYL) 50 mg/ml oral suspension          Brief H and P: For complete details please refer to admission H and P, but in brief Patient is a 81 year old female with advanced dementia, failure to thrive, bedridden, CVA, atrial fibrillation (on aspirin), hypertension, arthritis presented to ED with altered mental status, lethargy and unresponsive episode. Patient lives at home with her family. At the time of the encounter, patient is unable to provide any history. Patient has multiple family members in the room and history was provided by them including daughter, grandson, granddaughter. Per family, this morning after breakfast, patient was noticed to be less responsive, had a near syncopal episode and not responding appropriately to questions. They checked her blood pressure and was unrecordable. EMS was called, patient was found to be tachycardiac with HR 100s, hypotensive 90/60 and an atrial fibrillation with RVR. For last few days, patient has been complaining of pain in her lower back, per daughter has a hairline fracture in the lower back. Patient has been declining significantly over the last 2-3 months. Family has Hoyer lift for the patient at home. In ED, BP initially improved with IV fluids, patient was given IV Cardizem. However  subsequently her pressure dropped to 60-70s. She also received morphine and fentanyl 25 mcg 1 for back pain. Cardiology was consulted and patient was seen by Dr. Excell Seltzer who had a lengthy discussion with patient's family and family at this time requested Comfort Care status. Labs reviewed, sodium 155, bicarbonate 16, BUN 32, creatinine 3.08 (creatinine was 0.95 on 06/02/15), albumin 1.9, lactic acid 6.0, WBCs 13.1, hemoglobin 9.0. Chest x-ray with cardiomegaly and central vascular congestion. EKG with rate 196, A. fib  with RVR  Hospital Course:   Atrial fibrillation with RVR (HCC): Known history of atrial fibrillation however was not on anticoagulation due to advanced dementia and fall risk. Patient also has multiple other comorbidities including hypotension, leukocytosis, severe hypernatremia with acute renal insufficiency and lactic acidosis on admission -At the time of arrival to ED, patient was hypotensive, tachycardiac/atrial fibrillation with RVR. EDP discussed with the family that patient may require cardioversion for unstable Afib with RVR however family did not wish to pursue cardioversion or any invasive measures at this time. Patient was given IV fluid bolus and Lopressor with no significant improvement in the heart rate. He shouldn't continued to remain hypotensive and complained of back pain. The risks of hypotension were discussed with IV pain medications. She was also given Cardizem however discontinued after the BP dropped. Cardiology was consulted in ED and patient was seen by Dr. Excell Seltzer who had a lengthy discussion with the family at which point the family requested for complete Comfort Care status. Patient was admitted to palliative unit. Palliative consult was also obtained. Patient was seen by Dr. Neale Burly and patient was started on symptomatic treatment for comfort. Patient was accepted to [facility].    Dementia with failure to thrive - Continue comfort care goals   Acute renal failure superimposed on stage 4 chronic kidney disease (HCC), severe hypernatremia with lactic acidosis, hypotension. Patient presented with sodium of 155, bicarbonate 16, creatinine 3.08, lactic acid 6, WBCs 13.1. - Patient was placed on gentle hydration with half normal saline  Coagulase negative bacteremia 1/2 likely contaminant   Day of Discharge BP 132/107 mmHg  Pulse 75  Temp(Src) 99.9 F (37.7 C) (Axillary)  Resp 18  Wt 61.236 kg (135 lb)  SpO2 100%  Physical Exam: General: Unresponsive HEENT:  anicteric sclera, pupils reactive to light and accommodation CVS: S1-S2 clear no murmur rubs or gallops Chest: Decreased breath sounds at the bases Abdomen: soft nondistended, normal bowel sounds Extremities: no cyanosis, clubbing or edema noted bilaterally Neuro: unresponsive   The results of significant diagnostics from this hospitalization (including imaging, microbiology, ancillary and laboratory) are listed below for reference.    LAB RESULTS: Basic Metabolic Panel:  Recent Labs Lab 06/13/15 1450  NA 155*  K 4.3  CL 128*  CO2 16*  GLUCOSE 90  BUN 32*  CREATININE 3.08*  CALCIUM 8.4*   Liver Function Tests:  Recent Labs Lab 06/13/15 1450  AST 27  ALT 10*  ALKPHOS 55  BILITOT 1.0  PROT 6.9  ALBUMIN 1.9*   No results for input(s): LIPASE, AMYLASE in the last 168 hours. No results for input(s): AMMONIA in the last 168 hours. CBC:  Recent Labs Lab 06/13/15 1450  WBC 13.1*  NEUTROABS 10.4*  HGB 9.0*  HCT 27.8*  MCV 74.3*  PLT PLATELET CLUMPS NOTED ON SMEAR, COUNT APPEARS DECREASED   Cardiac Enzymes: No results for input(s): CKTOTAL, CKMB, CKMBINDEX, TROPONINI in the last 168 hours. BNP: Invalid input(s): POCBNP CBG:  Recent Labs Lab 06/13/15 1422  GLUCAP 84    Significant Diagnostic Studies:  Dg Chest Portable 1 View  06/13/2015  CLINICAL DATA:  81 year old female with altered mental status. Initial encounter. EXAM: PORTABLE CHEST 1 VIEW COMPARISON:  05/27/2015. FINDINGS: Cardiomegaly.  Tortuous aorta. Central pulmonary vascular prominence superimposed upon chronic lung changes without segmental consolidation or pneumothorax. IMPRESSION: Central pulmonary vascular prominence superimposed upon chronic lung changes. Cardiomegaly. Tortuous aorta. Electronically Signed   By: Lacy Duverney M.D.   On: 06/13/2015 15:33    2D ECHO:   Disposition and Follow-up:    DISPOSITION: [facility]   DISCHARGE FOLLOW-UP Follow-up Information    Schedule an  appointment as soon as possible for a visit with FULP, CAMMIE, MD.   Specialty:  Family Medicine   Why:  If symptoms worsen, As needed   Contact information:   [address] [place] [address] [phone]        Time spent on Discharge:   Signed:   RAI,RIPUDEEP M.D. [facility] 06/16/2015, 7:11 AM Pager: [phone]

## 2015-06-18 LAB — CULTURE, BLOOD (ROUTINE X 2): Culture: NO GROWTH

## 2015-07-07 DEATH — deceased

## 2015-11-20 IMAGING — CT CT HEAD W/O CM
4 of 6 series · 15 of 30 positions shown, 17 images · non-contrast
Comparison: CT HEAD W/O CM dated 10/21/2012

CLINICAL DATA: Altered mental status, fall, on Coumadin.

EXAM:
CT HEAD WITHOUT CONTRAST
CT CERVICAL SPINE WITHOUT CONTRAST
TECHNIQUE: Multidetector CT imaging of the head and cervical spine was
performed following the standard protocol without intravenous
contrast. Multiplanar CT image reconstructions of the cervical spine
were also generated.

[Series 3: head w/o · axial · non-contrast · 0.43mm/px · z∈[-107,-57]mm · 2 of 30 slices shown]
[im 10/30  brain]
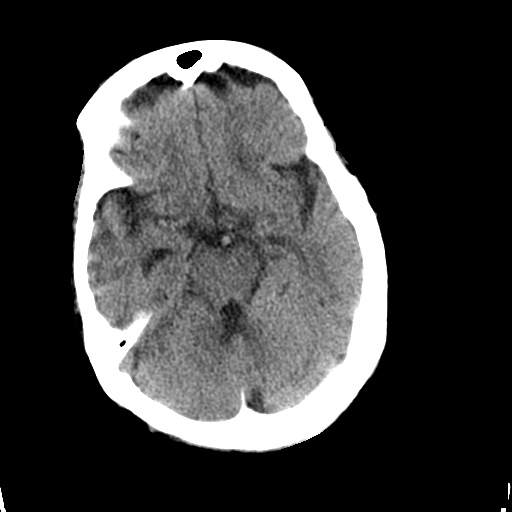
[im 20/30  brain]
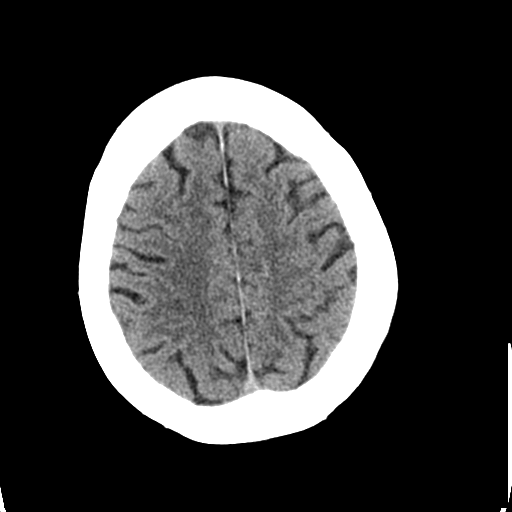

[Series 6: bone windows · axial · 0.43mm/px · z∈[-116,-44]mm · 3 of 49 slices shown]
[im 13/49  bone]
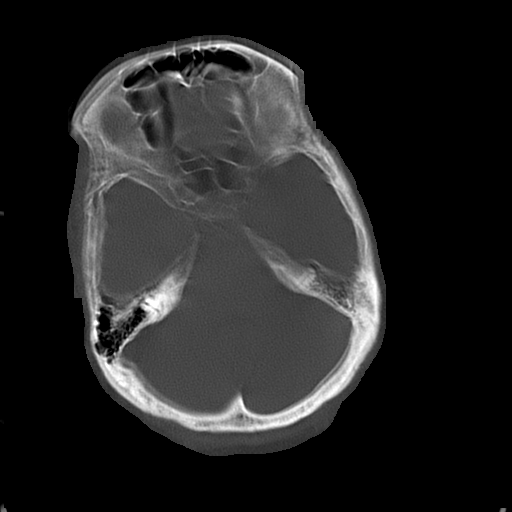
[im 25/49  bone]
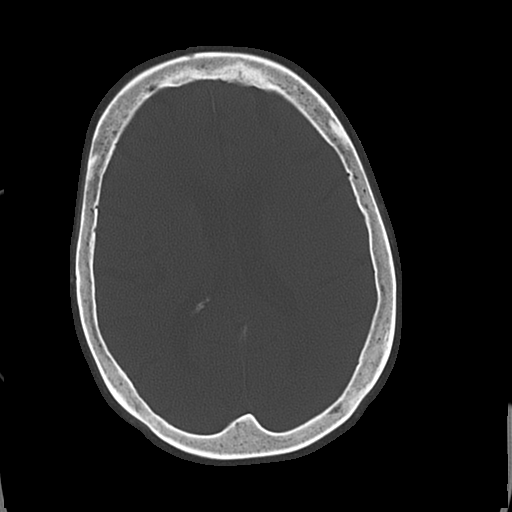
[im 37/49  bone]
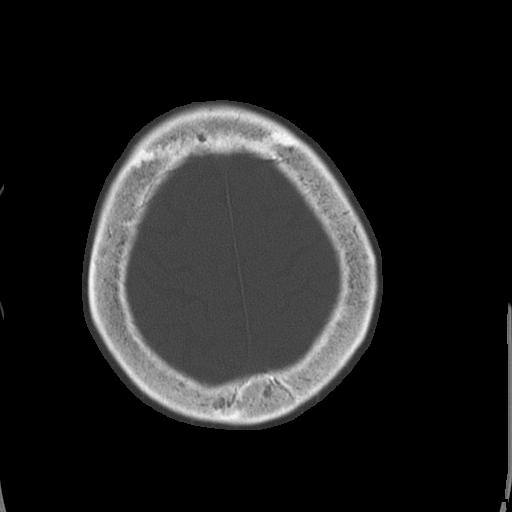

[Series 7: c-spine st · axial · 0.27mm/px · z∈[-262,-222]mm · 3 of 81 slices shown]
[im 11/81  brain]
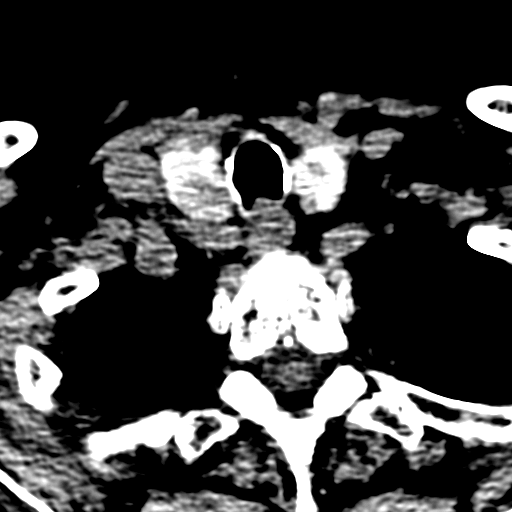
[im 21/81  brain]
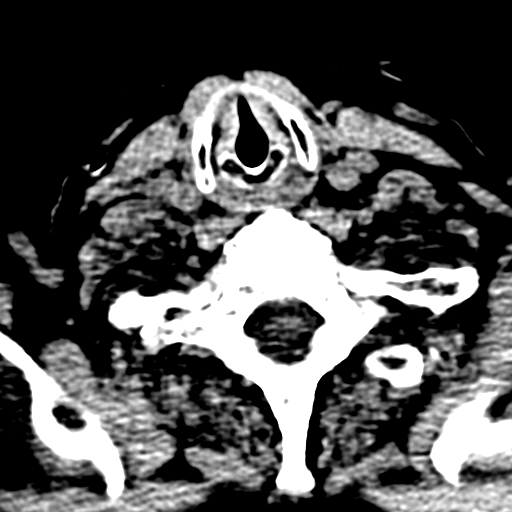
[im 31/81  brain]
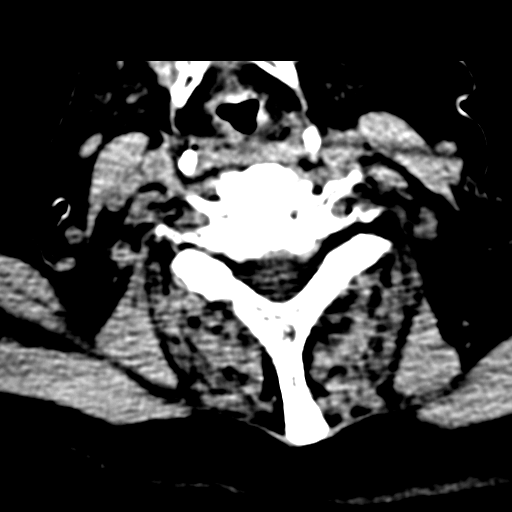

[Series 9: axial recon · axial · 0.20mm/px · z∈[-278,-170]mm · 7 of 81 slices shown, 9 images]
[im 11/81  brain]
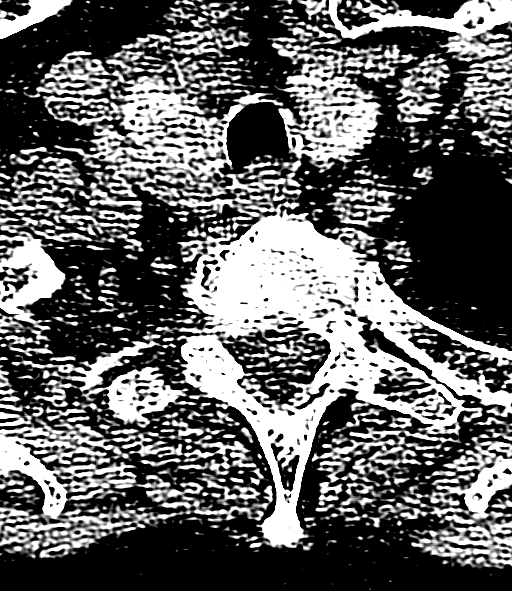
[im 11/81  bone]
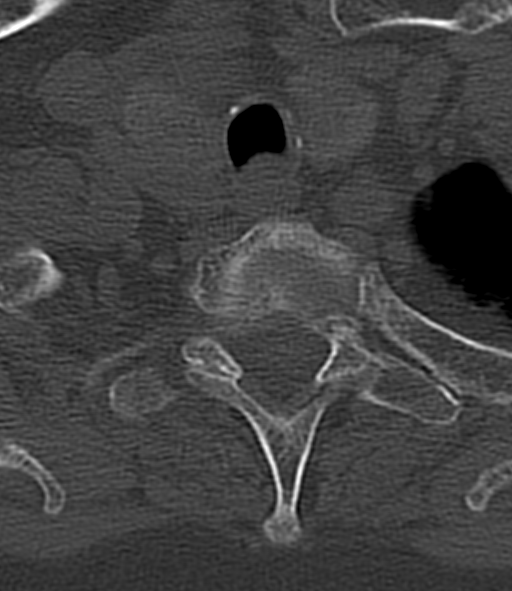
[im 21/81  brain]
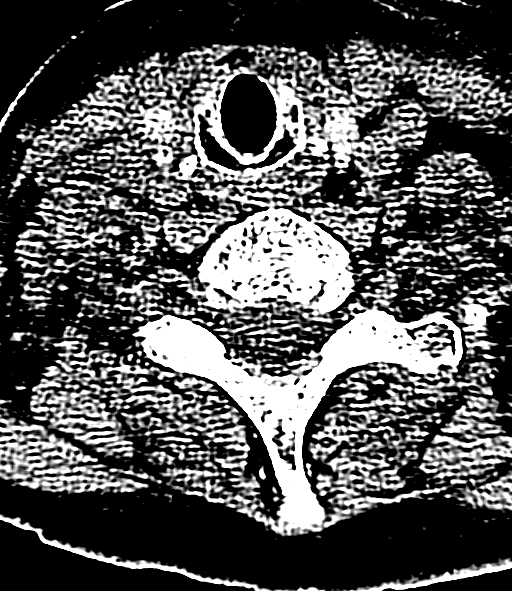
[im 31/81  brain]
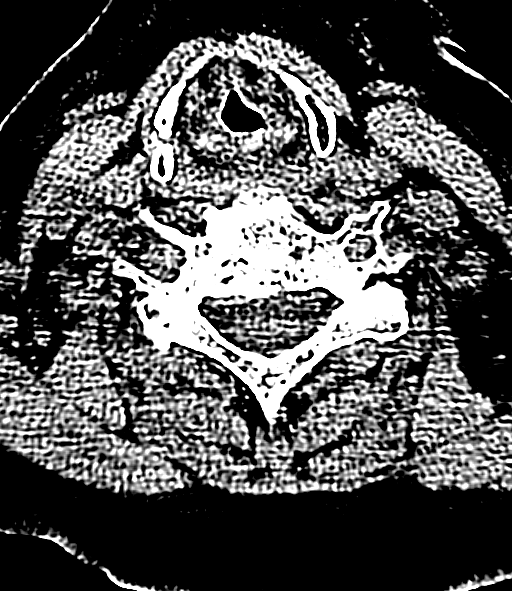
[im 41/81  brain]
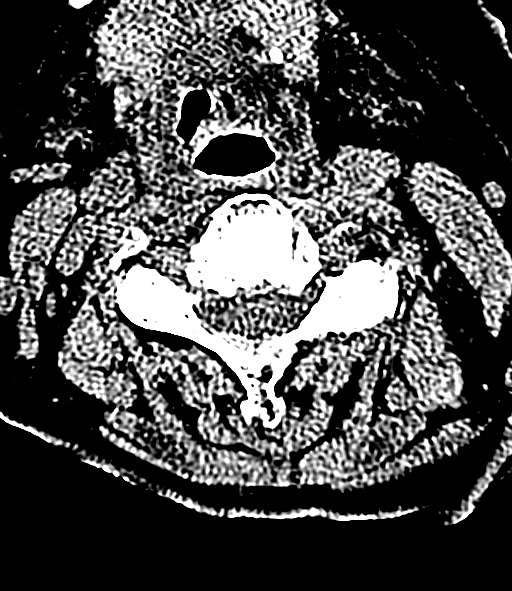
[im 51/81  brain]
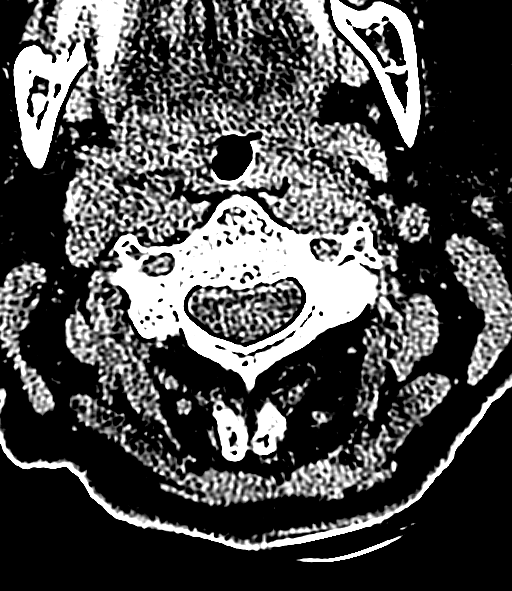
[im 51/81  bone]
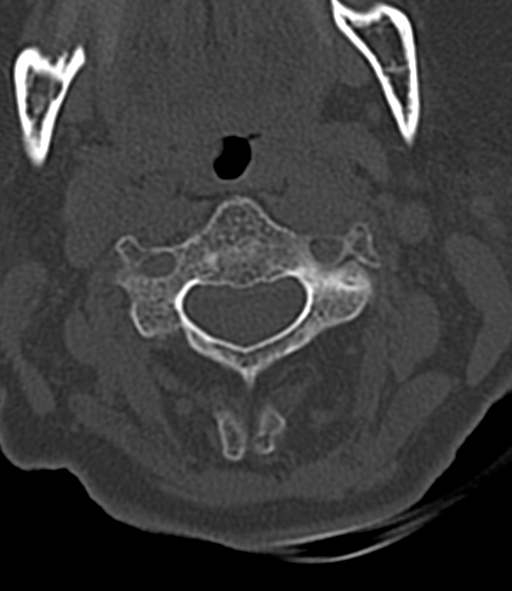
[im 61/81  brain]
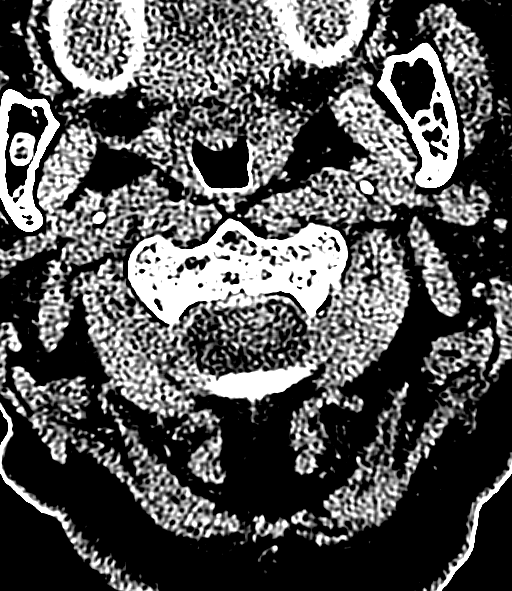
[im 71/81  brain]
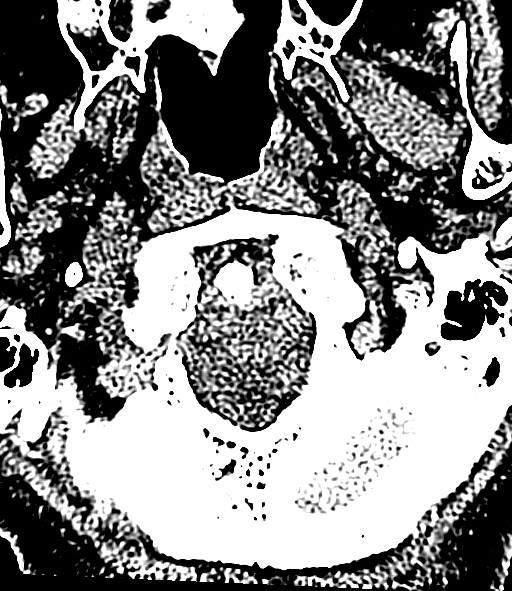

[15 of 30 positions shown; findings below may reference images not displayed]

FINDINGS: CT HEAD FINDINGS

The ventricles and sulci are normal for age. 11 mm round density
within the right caudate head, axial [DATE]. No intraparenchymal mass
effect nor midline shift. Confluent supratentorial white matter
hypodensities are within normal range for patient's age and though
non-specific suggest sequelae of chronic small vessel ischemic
disease. No acute large vascular territory infarcts.

No abnormal extra-axial fluid collections. Stable subcentimeter
calcification along the interhemispheric fissure. Basal cisterns are
patent. Moderate calcific atherosclerosis of the carotid siphons.

No skull fracture. The included ocular globes and orbital contents
are non-suspicious. The mastoid aircells and included paranasal
sinuses are well-aerated.

CT CERVICAL SPINE FINDINGS

Cervical vertebral bodies and posterior elements are intact and
aligned with straightened cervical lordosis. Moderate to severe
C3-4, C5-6 and C6-7 degenerative disc disease. Atlantodental
interval is 2.5 mm, widened. No destructive bony lesions. Bone
mineral density is decreased. Heterogeneous thyroid gland.
IMPRESSION: CT head: 11 mm round density in right caudate head likely reflects
acute hemorrhage without mass effect.

Involutional changes. Moderate to severe white matter changes may
reflect chronic small vessel ischemic disease.

CT cervical spine: Straightened cervical lordosis without acute
fracture nor malalignment. However, mildly widened atlantodental
interval can be seen with ligamentous laxity or injury. If
clinically indicated consider follow-up MRI of the cervical spine.

Findings discussed with and reconfirmed by Dr.Zaira
on11/13/2013at[DATE].

  By: Katsuji Honami

## 2015-11-21 IMAGING — CT CT HEAD W/O CM
1 series · 15 of 30 positions shown, 19 images · non-contrast
Comparison: CT HEAD W/O CM dated 11/13/2013; CT C SPINE W/O CM dated
11/13/2013; CT HEAD W/O CM dated 10/21/2012 is

CLINICAL DATA: Altered mental status, recent intracranial
hemorrhage after fall

EXAM:
CT HEAD WITHOUT CONTRAST
TECHNIQUE: Contiguous axial images were obtained from the base of the skull
through the vertex without intravenous contrast.

[Series 2: head 5.0 h30s · axial · 0.44mm/px · z∈[-67,+68]mm · 15 of 30 slices shown, 19 images]
[im 2/30  brain]
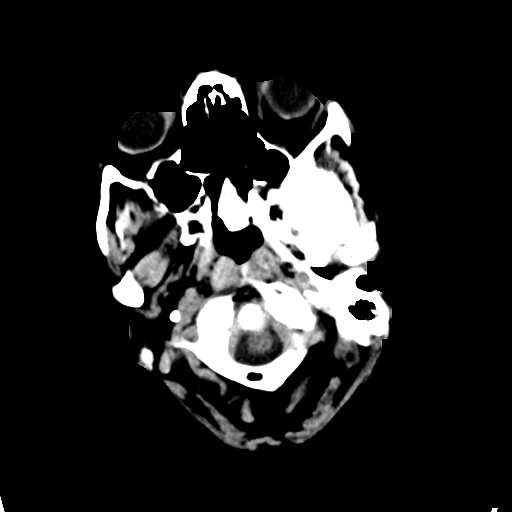
[im 2/30  bone]
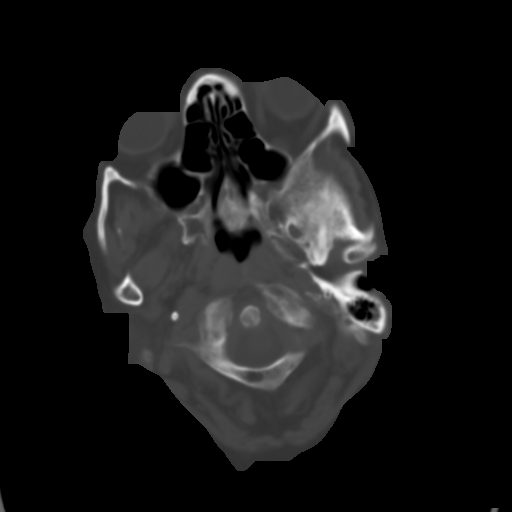
[im 4/30  brain]
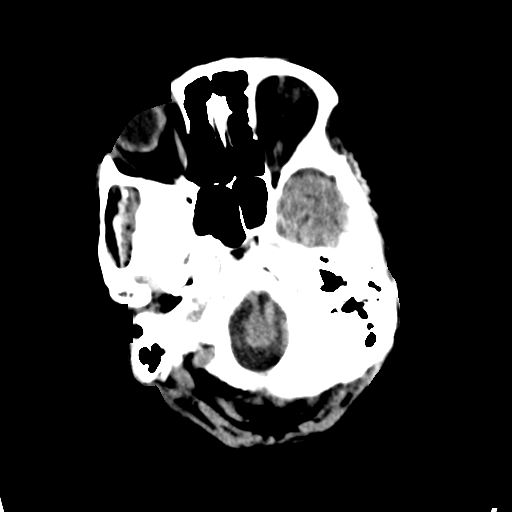
[im 6/30  brain]
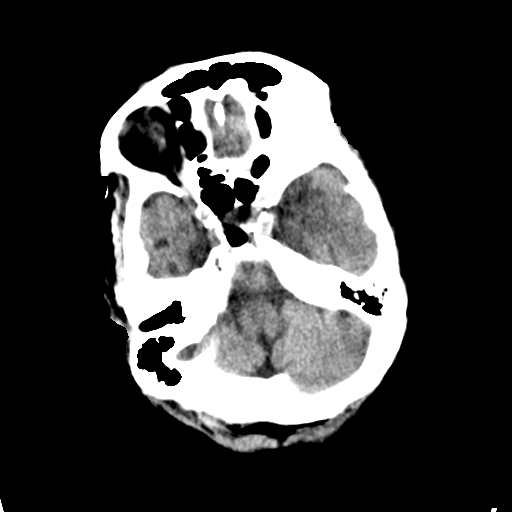
[im 8/30  brain]
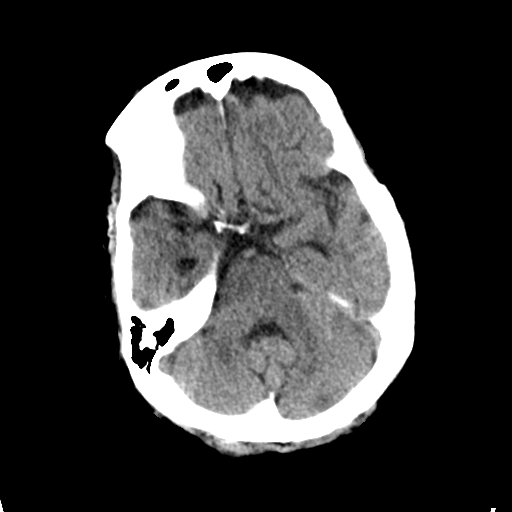
[im 10/30  brain]
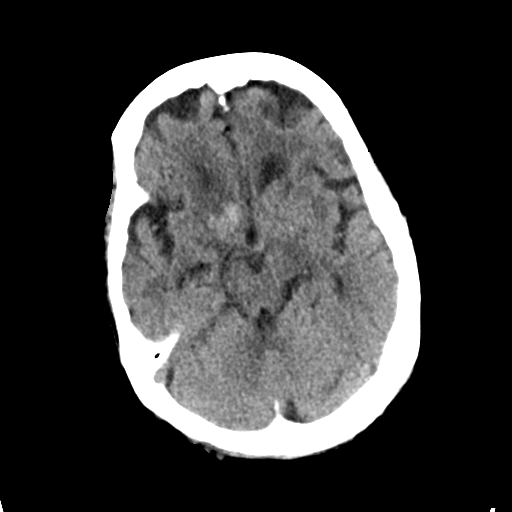
[im 10/30  bone]
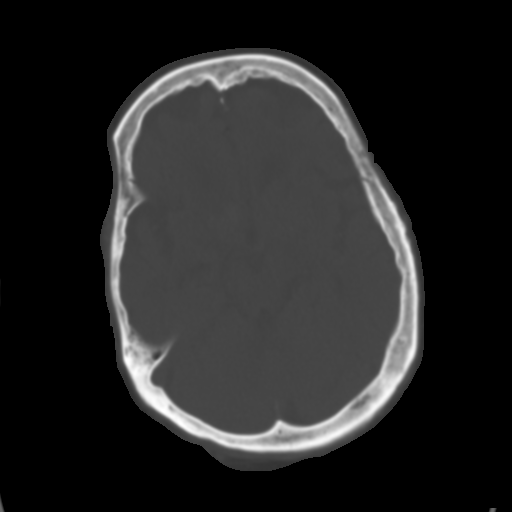
[im 12/30  brain]
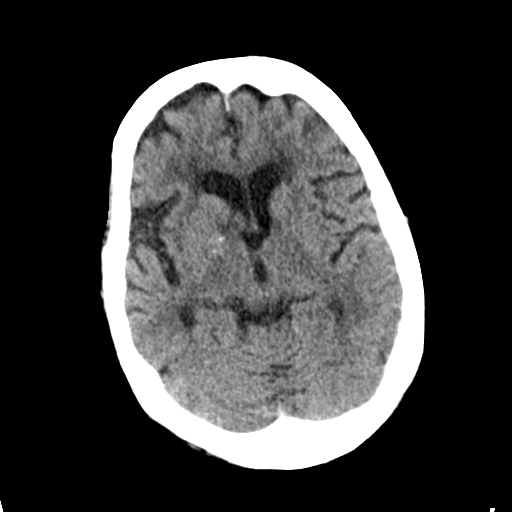
[im 14/30  brain]
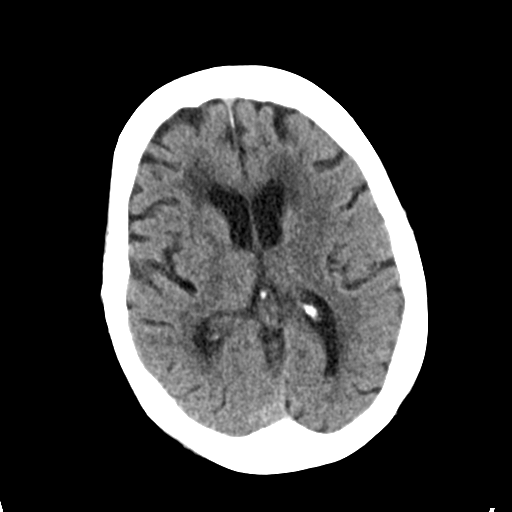
[im 16/30  brain]
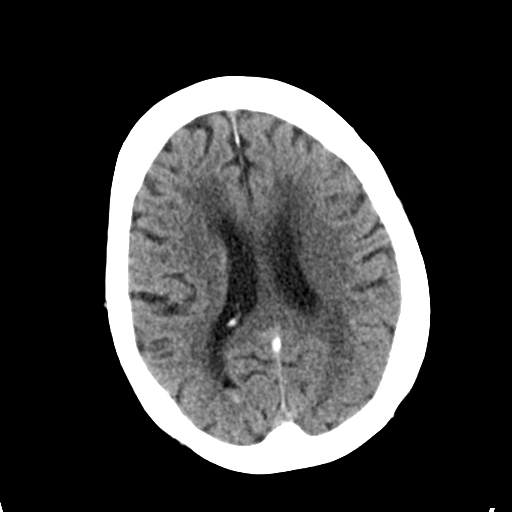
[im 17/30  brain]
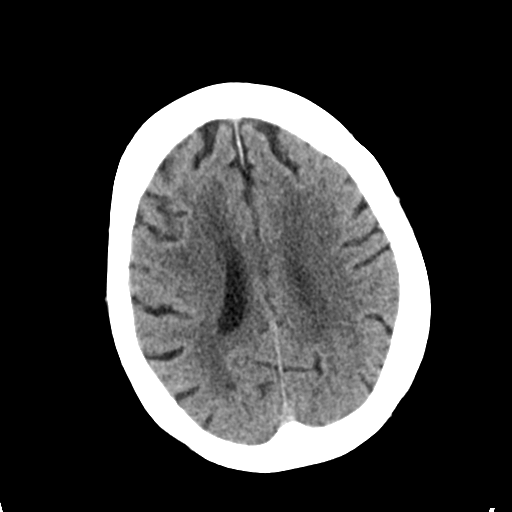
[im 17/30  bone]
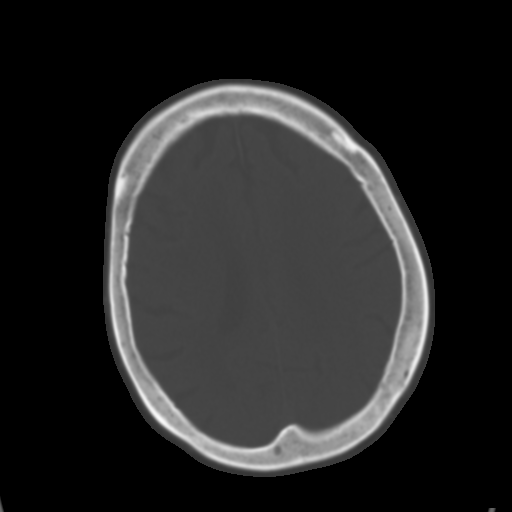
[im 19/30  brain]
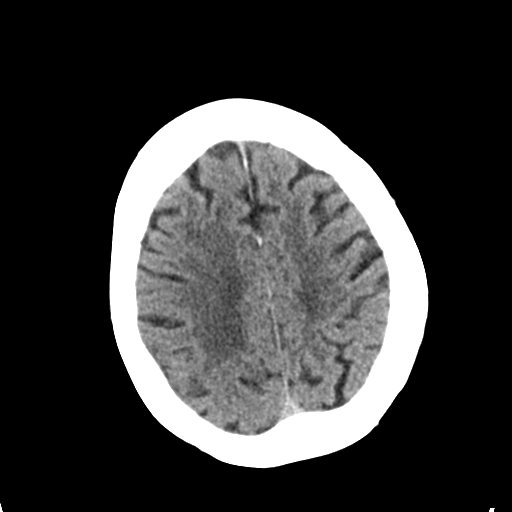
[im 21/30  brain]
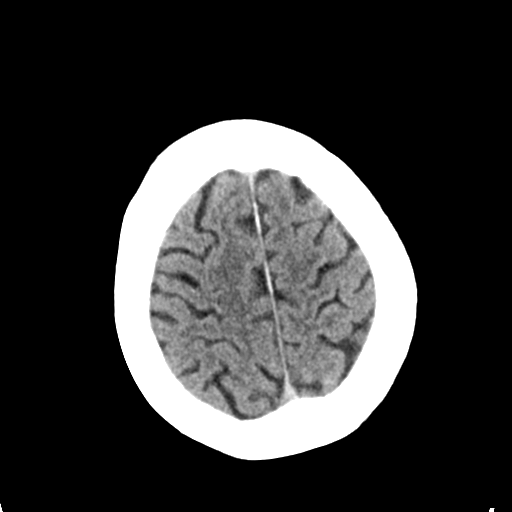
[im 23/30  brain]
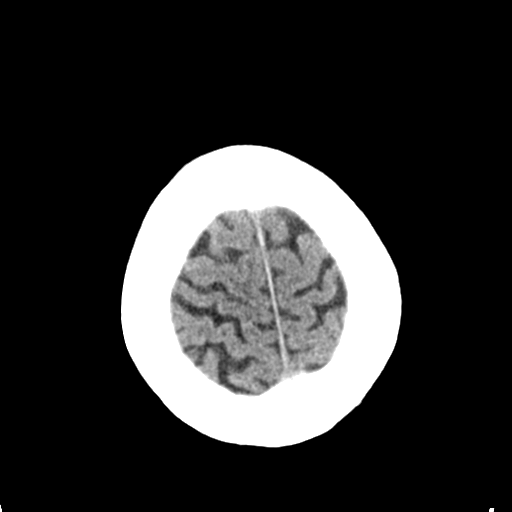
[im 25/30  brain]
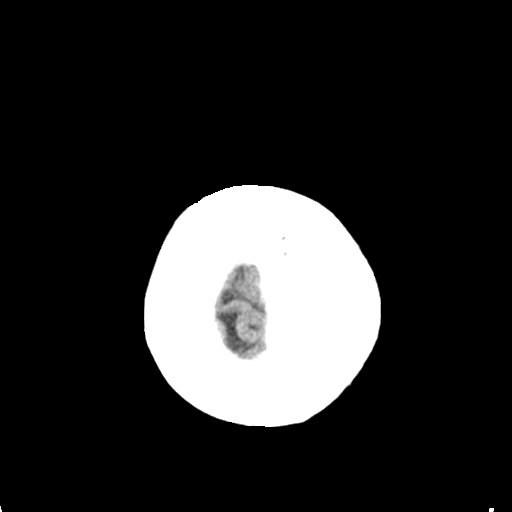
[im 25/30  bone]
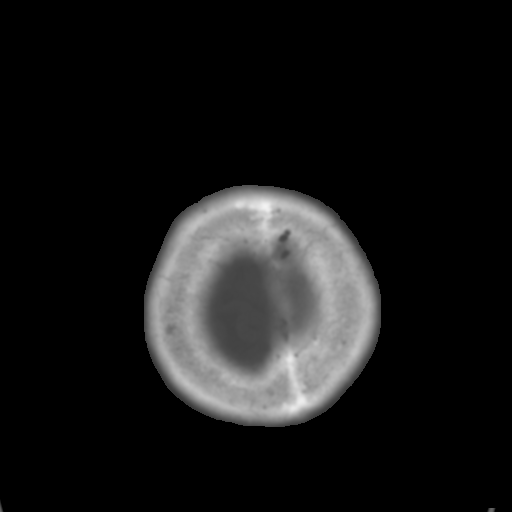
[im 27/30  brain]
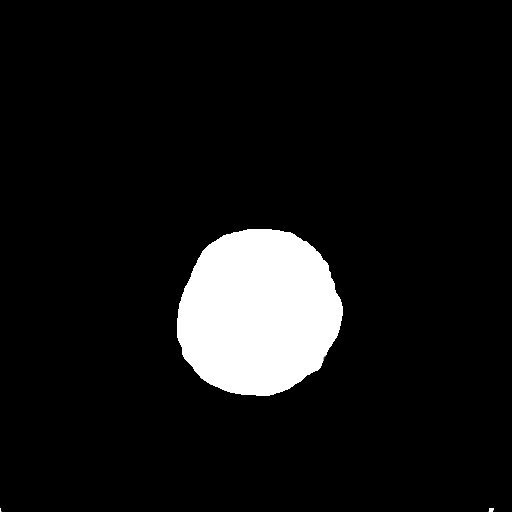
[im 29/30  brain]
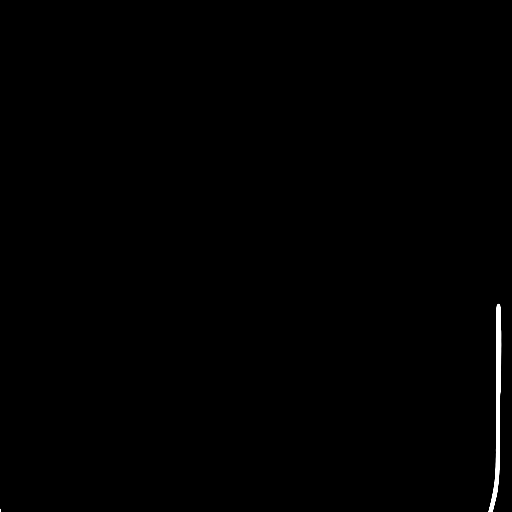

[15 of 30 positions shown; findings below may reference images not displayed]

FINDINGS: Moderate atrophy and low attenuation in the deep white matter
consistent with chronic involutional change. Age-related basal
ganglia calcification and calcifications in the falx, stable. There
is again high attenuation in the right caudate, unchanged at 18 x 10
mm. There is mild mass effect on the frontal horn of the right
lateral ventricle, unchanged. There is no hydrocephalus but there is
a small volume of hemorrhage layering dependently in both lateral
ventricles.

No evidence of low attenuation to suggest vascular territory
infarct.
IMPRESSION: Stable focus of hyperattenuation in the right caudate, again
consistent with hemorrhage. Small volume hemorrhage layering
dependently in both ventricles. No hydrocephalus.
